# Patient Record
Sex: Female | Born: 1965 | Race: White | Hispanic: No | Marital: Married | Smoking: Former smoker
Health system: Southern US, Community
[De-identification: ages and names within clinical notes are randomized; demographics above are authoritative.]

## PROBLEM LIST (undated history)

## (undated) DIAGNOSIS — F419 Anxiety disorder, unspecified: Secondary | ICD-10-CM

## (undated) DIAGNOSIS — F319 Bipolar disorder, unspecified: Secondary | ICD-10-CM

## (undated) DIAGNOSIS — M1710 Unilateral primary osteoarthritis, unspecified knee: Secondary | ICD-10-CM

## (undated) DIAGNOSIS — F329 Major depressive disorder, single episode, unspecified: Secondary | ICD-10-CM

## (undated) DIAGNOSIS — M1990 Unspecified osteoarthritis, unspecified site: Secondary | ICD-10-CM

## (undated) DIAGNOSIS — E7800 Pure hypercholesterolemia, unspecified: Secondary | ICD-10-CM

## (undated) DIAGNOSIS — I10 Essential (primary) hypertension: Secondary | ICD-10-CM

## (undated) DIAGNOSIS — Z9889 Other specified postprocedural states: Secondary | ICD-10-CM

## (undated) DIAGNOSIS — K2270 Barrett's esophagus without dysplasia: Secondary | ICD-10-CM

## (undated) DIAGNOSIS — F32A Depression, unspecified: Secondary | ICD-10-CM

## (undated) DIAGNOSIS — J449 Chronic obstructive pulmonary disease, unspecified: Secondary | ICD-10-CM

## (undated) DIAGNOSIS — R112 Nausea with vomiting, unspecified: Secondary | ICD-10-CM

## (undated) DIAGNOSIS — K219 Gastro-esophageal reflux disease without esophagitis: Secondary | ICD-10-CM

## (undated) DIAGNOSIS — G35 Multiple sclerosis: Secondary | ICD-10-CM

## (undated) DIAGNOSIS — T148XXA Other injury of unspecified body region, initial encounter: Secondary | ICD-10-CM

## (undated) HISTORY — DX: Other injury of unspecified body region, initial encounter: T14.8XXA

## (undated) HISTORY — DX: Multiple sclerosis: G35

## (undated) HISTORY — DX: Barrett's esophagus without dysplasia: K22.70

## (undated) HISTORY — PX: OTHER SURGICAL HISTORY: SHX169

## (undated) HISTORY — DX: Depression, unspecified: F32.A

## (undated) HISTORY — PX: FOOT SURGERY: SHX648

## (undated) HISTORY — PX: TUBAL LIGATION: SHX77

## (undated) HISTORY — DX: Chronic obstructive pulmonary disease, unspecified: J44.9

## (undated) HISTORY — DX: Pure hypercholesterolemia, unspecified: E78.00

## (undated) HISTORY — DX: Major depressive disorder, single episode, unspecified: F32.9

## (undated) HISTORY — PX: SINUS SURGERY WITH INSTATRAK: SHX5215

## (undated) HISTORY — DX: Gastro-esophageal reflux disease without esophagitis: K21.9

## (undated) HISTORY — DX: Bipolar disorder, unspecified: F31.9

## (undated) HISTORY — PX: ABDOMINAL HYSTERECTOMY: SHX81

## (undated) HISTORY — DX: Essential (primary) hypertension: I10

## (undated) HISTORY — DX: Anxiety disorder, unspecified: F41.9

## (undated) HISTORY — PX: SURGERY OF LIP: SUR1315

---

## 1999-06-15 ENCOUNTER — Other Ambulatory Visit: Admission: RE | Admit: 1999-06-15 | Discharge: 1999-06-15 | Payer: Self-pay | Admitting: Gynecology

## 2002-10-27 ENCOUNTER — Inpatient Hospital Stay (HOSPITAL_COMMUNITY): Admission: EM | Admit: 2002-10-27 | Discharge: 2002-10-29 | Payer: Self-pay | Admitting: Emergency Medicine

## 2002-10-27 ENCOUNTER — Encounter: Payer: Self-pay | Admitting: Emergency Medicine

## 2002-10-29 ENCOUNTER — Encounter: Payer: Self-pay | Admitting: *Deleted

## 2004-02-16 ENCOUNTER — Ambulatory Visit: Payer: Self-pay | Admitting: Family Medicine

## 2004-05-27 ENCOUNTER — Ambulatory Visit: Payer: Self-pay | Admitting: Family Medicine

## 2004-06-30 ENCOUNTER — Ambulatory Visit: Payer: Self-pay | Admitting: Family Medicine

## 2004-07-12 ENCOUNTER — Ambulatory Visit: Payer: Self-pay | Admitting: Family Medicine

## 2004-08-11 ENCOUNTER — Ambulatory Visit: Payer: Self-pay | Admitting: Family Medicine

## 2004-09-22 ENCOUNTER — Ambulatory Visit: Payer: Self-pay | Admitting: Family Medicine

## 2004-10-01 ENCOUNTER — Emergency Department (HOSPITAL_COMMUNITY): Admission: EM | Admit: 2004-10-01 | Discharge: 2004-10-01 | Payer: Self-pay | Admitting: Emergency Medicine

## 2004-10-13 ENCOUNTER — Ambulatory Visit: Payer: Self-pay | Admitting: Family Medicine

## 2004-10-24 ENCOUNTER — Ambulatory Visit: Payer: Self-pay | Admitting: Orthopedic Surgery

## 2004-12-06 ENCOUNTER — Ambulatory Visit: Payer: Self-pay | Admitting: Family Medicine

## 2005-02-09 ENCOUNTER — Ambulatory Visit: Payer: Self-pay | Admitting: Family Medicine

## 2005-03-12 ENCOUNTER — Emergency Department (HOSPITAL_COMMUNITY): Admission: EM | Admit: 2005-03-12 | Discharge: 2005-03-13 | Payer: Self-pay | Admitting: Emergency Medicine

## 2005-03-24 ENCOUNTER — Ambulatory Visit: Payer: Self-pay | Admitting: Family Medicine

## 2005-04-28 ENCOUNTER — Ambulatory Visit: Payer: Self-pay | Admitting: Family Medicine

## 2005-05-19 ENCOUNTER — Emergency Department (HOSPITAL_COMMUNITY): Admission: EM | Admit: 2005-05-19 | Discharge: 2005-05-19 | Payer: Self-pay | Admitting: Emergency Medicine

## 2005-05-22 ENCOUNTER — Ambulatory Visit: Payer: Self-pay | Admitting: Family Medicine

## 2005-05-24 ENCOUNTER — Ambulatory Visit: Payer: Self-pay | Admitting: Family Medicine

## 2005-08-06 ENCOUNTER — Emergency Department (HOSPITAL_COMMUNITY): Admission: EM | Admit: 2005-08-06 | Discharge: 2005-08-06 | Payer: Self-pay | Admitting: *Deleted

## 2005-08-22 ENCOUNTER — Ambulatory Visit: Payer: Self-pay | Admitting: Family Medicine

## 2005-10-06 ENCOUNTER — Ambulatory Visit: Payer: Self-pay | Admitting: Family Medicine

## 2005-10-17 ENCOUNTER — Emergency Department (HOSPITAL_COMMUNITY): Admission: EM | Admit: 2005-10-17 | Discharge: 2005-10-18 | Payer: Self-pay | Admitting: Emergency Medicine

## 2005-10-17 ENCOUNTER — Ambulatory Visit: Payer: Self-pay | Admitting: Family Medicine

## 2005-10-18 ENCOUNTER — Inpatient Hospital Stay (HOSPITAL_COMMUNITY): Admission: AD | Admit: 2005-10-18 | Discharge: 2005-10-20 | Payer: Self-pay | Admitting: Psychiatry

## 2005-10-18 ENCOUNTER — Ambulatory Visit: Payer: Self-pay | Admitting: Psychiatry

## 2005-10-23 ENCOUNTER — Emergency Department (HOSPITAL_COMMUNITY): Admission: EM | Admit: 2005-10-23 | Discharge: 2005-10-23 | Payer: Self-pay | Admitting: Emergency Medicine

## 2005-10-26 ENCOUNTER — Ambulatory Visit: Payer: Self-pay | Admitting: Family Medicine

## 2005-11-13 ENCOUNTER — Emergency Department (HOSPITAL_COMMUNITY): Admission: EM | Admit: 2005-11-13 | Discharge: 2005-11-13 | Payer: Self-pay | Admitting: Emergency Medicine

## 2005-11-27 ENCOUNTER — Ambulatory Visit: Payer: Self-pay | Admitting: Family Medicine

## 2006-01-19 ENCOUNTER — Ambulatory Visit (HOSPITAL_COMMUNITY): Admission: RE | Admit: 2006-01-19 | Discharge: 2006-01-19 | Payer: Self-pay | Admitting: Neurology

## 2006-02-06 ENCOUNTER — Ambulatory Visit: Payer: Self-pay | Admitting: Family Medicine

## 2006-03-21 ENCOUNTER — Emergency Department (HOSPITAL_COMMUNITY): Admission: EM | Admit: 2006-03-21 | Discharge: 2006-03-21 | Payer: Self-pay | Admitting: Emergency Medicine

## 2006-04-06 ENCOUNTER — Ambulatory Visit: Payer: Self-pay | Admitting: Family Medicine

## 2006-04-25 ENCOUNTER — Ambulatory Visit: Payer: Self-pay | Admitting: Family Medicine

## 2006-06-10 ENCOUNTER — Emergency Department (HOSPITAL_COMMUNITY): Admission: EM | Admit: 2006-06-10 | Discharge: 2006-06-10 | Payer: Self-pay | Admitting: Emergency Medicine

## 2006-06-22 ENCOUNTER — Ambulatory Visit: Payer: Self-pay | Admitting: Family Medicine

## 2006-06-29 ENCOUNTER — Emergency Department (HOSPITAL_COMMUNITY): Admission: EM | Admit: 2006-06-29 | Discharge: 2006-06-29 | Payer: Self-pay | Admitting: Emergency Medicine

## 2006-07-05 ENCOUNTER — Ambulatory Visit: Payer: Self-pay | Admitting: Family Medicine

## 2006-07-10 ENCOUNTER — Ambulatory Visit (HOSPITAL_COMMUNITY): Admission: RE | Admit: 2006-07-10 | Discharge: 2006-07-10 | Payer: Self-pay | Admitting: Family Medicine

## 2006-08-02 ENCOUNTER — Ambulatory Visit: Payer: Self-pay | Admitting: Family Medicine

## 2006-09-04 ENCOUNTER — Ambulatory Visit: Payer: Self-pay | Admitting: Family Medicine

## 2006-09-27 ENCOUNTER — Encounter: Admission: RE | Admit: 2006-09-27 | Discharge: 2006-10-25 | Payer: Self-pay | Admitting: Orthopaedic Surgery

## 2006-10-08 ENCOUNTER — Encounter: Admission: RE | Admit: 2006-10-08 | Discharge: 2006-10-08 | Payer: Self-pay | Admitting: Orthopaedic Surgery

## 2006-10-29 ENCOUNTER — Encounter: Admission: RE | Admit: 2006-10-29 | Discharge: 2006-10-29 | Payer: Self-pay | Admitting: Orthopaedic Surgery

## 2006-11-20 ENCOUNTER — Encounter: Admission: RE | Admit: 2006-11-20 | Discharge: 2006-11-20 | Payer: Self-pay | Admitting: Orthopaedic Surgery

## 2007-10-02 ENCOUNTER — Ambulatory Visit (HOSPITAL_COMMUNITY): Admission: RE | Admit: 2007-10-02 | Discharge: 2007-10-02 | Payer: Self-pay | Admitting: Family Medicine

## 2009-01-26 ENCOUNTER — Ambulatory Visit (HOSPITAL_COMMUNITY): Admission: RE | Admit: 2009-01-26 | Discharge: 2009-01-26 | Payer: Self-pay | Admitting: Obstetrics & Gynecology

## 2010-01-31 ENCOUNTER — Ambulatory Visit (HOSPITAL_COMMUNITY): Admission: RE | Admit: 2010-01-31 | Discharge: 2010-01-31 | Payer: Self-pay | Admitting: Obstetrics & Gynecology

## 2010-02-15 ENCOUNTER — Emergency Department (HOSPITAL_COMMUNITY): Admission: EM | Admit: 2010-02-15 | Discharge: 2010-02-15 | Payer: Self-pay | Admitting: Emergency Medicine

## 2010-06-09 HISTORY — PX: ESOPHAGOGASTRODUODENOSCOPY: SHX1529

## 2010-06-13 ENCOUNTER — Encounter: Payer: Self-pay | Admitting: Internal Medicine

## 2010-06-13 ENCOUNTER — Encounter: Payer: Self-pay | Admitting: Gastroenterology

## 2010-06-13 ENCOUNTER — Ambulatory Visit (INDEPENDENT_AMBULATORY_CARE_PROVIDER_SITE_OTHER): Payer: Medicare Other | Admitting: Gastroenterology

## 2010-06-13 DIAGNOSIS — R1013 Epigastric pain: Secondary | ICD-10-CM | POA: Insufficient documentation

## 2010-06-13 DIAGNOSIS — R1319 Other dysphagia: Secondary | ICD-10-CM

## 2010-06-13 DIAGNOSIS — R1310 Dysphagia, unspecified: Secondary | ICD-10-CM | POA: Insufficient documentation

## 2010-06-17 ENCOUNTER — Encounter: Payer: Self-pay | Admitting: Gastroenterology

## 2010-06-20 ENCOUNTER — Other Ambulatory Visit: Payer: Self-pay | Admitting: Internal Medicine

## 2010-06-20 ENCOUNTER — Encounter (HOSPITAL_COMMUNITY): Payer: Medicare Other | Attending: Internal Medicine

## 2010-06-20 DIAGNOSIS — Z01810 Encounter for preprocedural cardiovascular examination: Secondary | ICD-10-CM | POA: Insufficient documentation

## 2010-06-20 DIAGNOSIS — Z01812 Encounter for preprocedural laboratory examination: Secondary | ICD-10-CM | POA: Insufficient documentation

## 2010-06-20 LAB — HEMOGLOBIN AND HEMATOCRIT, BLOOD
HCT: 43.8 % (ref 36.0–46.0)
Hemoglobin: 15.5 g/dL — ABNORMAL HIGH (ref 12.0–15.0)

## 2010-06-20 LAB — BASIC METABOLIC PANEL
Calcium: 9.1 mg/dL (ref 8.4–10.5)
GFR calc Af Amer: >60 mL/min (ref >60)
GFR calc non Af Amer: >60 mL/min (ref >60)
Glucose, Bld: 88 mg/dL (ref 70–99)
Potassium: 3.9 mEq/L (ref 3.5–5.1)
Sodium: 138 mEq/L (ref 135–145)

## 2010-06-21 LAB — CK TOTAL AND CKMB (NOT AT ARMC)
CK, MB: 1.6 ng/mL (ref 0.3–4.0)
Relative Index: INVALID (ref 0.0–2.5)
Total CK: 87 U/L (ref 7–177)

## 2010-06-21 LAB — GLUCOSE, CAPILLARY: Glucose-Capillary: 100 mg/dL — ABNORMAL HIGH (ref 70–99)

## 2010-06-21 LAB — URINALYSIS, ROUTINE W REFLEX MICROSCOPIC
Hgb urine dipstick: NEGATIVE
Ketones, ur: NEGATIVE mg/dL
Protein, ur: NEGATIVE mg/dL
Urobilinogen, UA: 0.2 mg/dL (ref 0.0–1.0)

## 2010-06-21 LAB — CBC
Hemoglobin: 15.6 g/dL — ABNORMAL HIGH (ref 12.0–15.0)
Platelets: 206 10*3/uL (ref 150–400)
RBC: 4.92 MIL/uL (ref 3.87–5.11)
WBC: 11.2 10*3/uL — ABNORMAL HIGH (ref 4.0–10.5)

## 2010-06-21 LAB — DIFFERENTIAL
Basophils Absolute: 0.1 10*3/uL (ref 0.0–0.1)
Basophils Relative: 1 % (ref 0–1)
Eosinophils Relative: 1 % (ref 0–5)
Lymphocytes Relative: 26 % (ref 12–46)
Monocytes Absolute: 0.7 10*3/uL (ref 0.1–1.0)
Monocytes Relative: 6 % (ref 3–12)

## 2010-06-21 LAB — RAPID URINE DRUG SCREEN, HOSP PERFORMED
Amphetamines: NOT DETECTED
Barbiturates: NOT DETECTED
Benzodiazepines: POSITIVE — AB
Tetrahydrocannabinol: NOT DETECTED

## 2010-06-21 LAB — COMPREHENSIVE METABOLIC PANEL
ALT: 14 U/L (ref 0–35)
AST: 19 U/L (ref 0–37)
Calcium: 9.3 mg/dL (ref 8.4–10.5)
Creatinine, Ser: 0.8 mg/dL (ref 0.4–1.2)
GFR calc Af Amer: >60 mL/min (ref >60)
GFR calc non Af Amer: >60 mL/min (ref >60)
Sodium: 136 mEq/L (ref 135–145)
Total Protein: 6.6 g/dL (ref 6.0–8.3)

## 2010-06-21 LAB — POCT CARDIAC MARKERS
CKMB, poc: <1 ng/mL — ABNORMAL LOW (ref 1.0–8.0)
Myoglobin, poc: 30.4 ng/mL (ref 12–200)

## 2010-06-21 LAB — TROPONIN I: Troponin I: 0.01 ng/mL (ref 0.00–0.06)

## 2010-06-21 NOTE — Letter (Signed)
Summary: EGD/ED ORDER  EGD/ED ORDER   Imported By: Ave Filter 06/13/2010 10:28:45  _____________________________________________________________________  External Attachment:    Type:   Image     Comment:   External Document

## 2010-06-21 NOTE — Assessment & Plan Note (Signed)
Summary: GERD,ACID REFLUX,WITH SMOTHERING EPISODES   Vital Signs:  Patient profile:   45 year old female Height:      65 inches Weight:      219 pounds BMI:     36.58 Temp:     98.9 degrees F oral Pulse rate:   92 / minute BP sitting:   108 / 82  (left arm)  Vitals Entered By: Carolan Clines LPN (June 12, 452 9:32 AM)  Visit Type:  Initial Consult Referring Provider:  Dr. Lysbeth Galas Primary Care Provider:  Dr. Lysbeth Galas   History of Present Illness: Katelyn Thompson is a pleasant 45 year old Caucasian female who presents today with c/o esophageal dysphagia X several years, but it has worsened over the past 4-5 months with increased choking, feelings like "closing up". +odynophagia. +epigastric pain, "burning", worse with eating. + nausea. Takes omeprazole twice/day. has been taking for several years. Has taken Nexium in remote past. Unable to afford. Was given Dexilant by Dr. Lysbeth Galas but unable to afford (90$ per month).  +smoker X 33 years, 1.5 ppd. No BC, goodys.  Pt states remote hx of upper EGD, unsure what year, possibly 20+ years ago. Reports possible ulcer.   Current Medications (verified): 1)  Albuterol Sulfate (2.5 Mg/12ml) 0.083% Nebu (Albuterol Sulfate) .... Use One Two Times A Day As Needed For Copd 2)  Hydrocodone-Homatropine 5-1.5 Mg/102ml Syrp (Hydrocodone-Homatropine) .... Take One Tsp Q6 As Needed 3)  Seroquel Xr 50 Mg Xr24h-Tab (Quetiapine Fumarate) .... Take One Once Daily 4)  Gabapentin 800 Mg Tabs (Gabapentin) .... Take One Three Times A Day 5)  Omeprazole 20 Mg Cpdr (Omeprazole) .... Take One Two Times A Day 6)  Meloxicam 7.5 Mg Tabs (Meloxicam) .... Take One Two Times A Day 7)  Lamotrigine 100 Mg Tabs (Lamotrigine) .... Take One Two Times A Day 8)  Ergocalciferol 50000 Unit Caps (Ergocalciferol) .... Take One Q Week For Three Months 9)  Epitol 200 Mg Tabs (Carbamazepine) .... Take One Two Times A Day 10)  Trazodone Hcl 150 Mg Tabs (Trazodone Hcl) .... Take One At Bedtime 11)   Alprazolam 1 Mg Tabs (Alprazolam) .... Take One Three Times A Day 12)  Flonase 50 Mcg/act Susp (Fluticasone Propionate) .... Use Two Squirts in Each Nostrile Once Daily 13)  Daliresp 500 Mcg Tabs (Roflumilast) .... Take One Once Daily 14)  Amitriptyline Hcl 25 Mg Tabs (Amitriptyline Hcl) .... Take One At Bed Time 15)  Amlodipine Besylate 5 Mg Tabs (Amlodipine Besylate) .... Take One Once Daily 16)  Lisinopril 10 Mg Tabs (Lisinopril) .... Take One Once Daily 17)  Colon Clense .... Take One Every Night 18)  Mucinex 600 Mg Xr12h-Tab (Guaifenesin) .... Take Bid 19)  Combivent 18-103 Mcg/act Aero (Ipratropium-Albuterol) .... Take Two Puffs As Needed For Cough  Allergies (verified): 1)  ! Macrobid 2)  ! Aspirin  Past History:  Past Medical History: GERD COPD HTN Severe nerve damage left foot Bipolar disorder ?MS but not officially diagnosed Depression Anxiety: does not like being around lots of people Hypercholesterolemia  Past Surgical History: Left foot X 2 hysterectomy sinus operation tubal ligation  Family History: Mother:living, DM, HTN, heart disease, osteoporosis  Father:deceased, heart failure Siblings: brother deceased at 67 from MI               brother heart disease               brother COPD          No FH of Colon Cancer:  Social History: Disabled Patient currently smokes. X 33 years, 1.5 ppd Alcohol Use - 1-2X per year Illicit Drug Use - no Smoking Status:  current Drug Use:  no  Review of Systems General:  Denies fever, chills, and anorexia. Eyes:  Denies blurring, irritation, and discharge. ENT:  Denies sore throat and hoarseness. CV:  Denies chest pains and syncope. Resp:  Denies dyspnea at rest and wheezing. GI:  See HPI. GU:  Denies urinary burning and urinary frequency. MS:  Denies joint pain / LOM, joint swelling, and joint stiffness. Derm:  Denies rash, itching, and dry skin. Neuro:  Denies weakness and syncope. Psych:  Denies depression  and anxiety. Endo:  Denies cold intolerance and heat intolerance.  Physical Exam  General:  Well developed, well nourished, no acute distress.obese.   Head:  Normocephalic and atraumatic. Eyes:  PERRLA, no icterus. Lungs:  bilateral inspiratory/expiratory wheezes. Diminished in bases.  Heart:  Regular rate and rhythm; no murmurs, rubs,  or bruits. Abdomen:  +BS, soft, mildly tender to palpation epigastric area. no rebound or guarding. no HSM.  Msk:  Symmetrical with no gross deformities. Normal posture. Pulses:  Normal pulses noted. Extremities:  No clubbing, cyanosis, edema or deformities noted. Neurologic:  Alert and  oriented x4;  grossly normal neurologically. Skin:  Intact without significant lesions or rashes. Psych:  Alert and cooperative. Normal mood and affect.   Impression & Recommendations:  Problem # 1:  ABDOMINAL PAIN-EPIGASTRIC (ICD-35.76)  45 year old Caucasian female with reports of esophageal dysphagia X several years but significantly worsened over past 4-5 months. Increased choking, feels like "closing up". +odynophagia, +epigastric burning pain, worsened with eating. Associated nausea. Symptoms continued despite PPI therapy twice/day (Prilosec). Denies BC or Goodys. Remote hx of EGD in past >20 years ago, questionable PUD. Worsening symptoms despite PPI therapy warrant further investigation.    EGD/ED with Dr. Jena Gauss in OR secondary to polypharmacy: the R/B/A have been discussed in detail with pt; she states understanding and desires to proceed. Continue Prilosec twice/day.   Orders: Consultation Level III (40981)  Problem # 2:  DYSPHAGIA (XBJ-478.29)  See # 1.   Orders: Consultation Level III (56213)   Orders Added: 1)  Consultation Level III [08657]

## 2010-06-21 NOTE — Letter (Signed)
[facility]: [facility]   Imported By: Rexene Alberts 06/17/2010 11:11:07  _____________________________________________________________________  External Attachment:    Type:   Image     Comment:   External Document

## 2010-06-23 ENCOUNTER — Encounter: Payer: Medicare Other | Admitting: Internal Medicine

## 2010-06-23 ENCOUNTER — Other Ambulatory Visit: Payer: Self-pay | Admitting: Internal Medicine

## 2010-06-23 ENCOUNTER — Ambulatory Visit (HOSPITAL_COMMUNITY)
Admission: RE | Admit: 2010-06-23 | Discharge: 2010-06-23 | Disposition: A | Discharge status: Home / Self Care | Payer: Medicare Other | Source: Ambulatory Visit | Attending: Internal Medicine | Admitting: Internal Medicine

## 2010-06-23 DIAGNOSIS — J4489 Other specified chronic obstructive pulmonary disease: Secondary | ICD-10-CM | POA: Insufficient documentation

## 2010-06-23 DIAGNOSIS — I10 Essential (primary) hypertension: Secondary | ICD-10-CM | POA: Insufficient documentation

## 2010-06-23 DIAGNOSIS — K449 Diaphragmatic hernia without obstruction or gangrene: Secondary | ICD-10-CM | POA: Insufficient documentation

## 2010-06-23 DIAGNOSIS — J449 Chronic obstructive pulmonary disease, unspecified: Secondary | ICD-10-CM | POA: Insufficient documentation

## 2010-06-23 DIAGNOSIS — R1310 Dysphagia, unspecified: Secondary | ICD-10-CM

## 2010-06-23 DIAGNOSIS — Z79899 Other long term (current) drug therapy: Secondary | ICD-10-CM | POA: Insufficient documentation

## 2010-06-23 DIAGNOSIS — R1013 Epigastric pain: Secondary | ICD-10-CM | POA: Insufficient documentation

## 2010-06-23 DIAGNOSIS — K2270 Barrett's esophagus without dysplasia: Secondary | ICD-10-CM | POA: Insufficient documentation

## 2010-06-26 NOTE — Op Note (Signed)
  [name], Katelyn Thompson                ACCOUNT NO.:  [id number]  MEDICAL RECORD NO.:  [id number]           PATIENT TYPE:  O  LOCATION:  DAYP                          FACILITY:  [facility]  PHYSICIAN:  R. Roetta Sessions, M.D. DATE OF BIRTH:  [birth year]  DATE OF PROCEDURE:  06/23/2010 DATE OF DISCHARGE:                              OPERATIVE REPORT   PROCEDURE:  EGD with Elease Hashimoto dilation followed by esophageal biopsy.  INDICATIONS FOR PROCEDURE:  A 45 year old lady with esophageal dysphagia for years, some odynophagia, recently epigastric burning, takes meloxicam daily, was not able to afford Dexilant in lieu of Prilosec. EGD is now being done.  Risks, benefits, limitations, alternatives, and imponderables have been discussed, questions answered.  Because of her polypharmacy, the procedure is being done in the OR under propofol. Please see the documentation for the medical record.  PROCEDURE NOTE:  The patient was placed in left lateral decubitusposition.  Propofol sedation per Dr. Jayme Cloud and associates.  Cetacaine spray for topical pharyngeal anesthesia.  INSTRUMENT:  Pentax video chip system.  FINDINGS:  Examination of tubular esophagus revealed a patent tubular esophagus.  There was a serrated accentuated, undulating Z-line.  There is no esophagitis, tumor, or other abnormality observed.  EG junction was easily traversed. Stomach:  The stomach was insufflated well with air.  Thorough examination of the gastric mucosa including retroflexed proximal stomach, esophagogastric junction demonstrated only a small hiatal hernia, otherwise gastric mucosa appeared entirely normal.  Pylorus was patent, easily traversed.  Examination of the bulb and second portion revealed no abnormalities.  THERAPEUTIC/DIAGNOSTIC MANEUVERS PERFORMED:  Scope was removed.  A 54- French Maloney dilator was passed to full insertion with ease.  Look back revealed no apparent complication.  All related passage of  dilated esophagus was not injured with the passage of the [facility] dilator. Subsequent biopsies of the salmon-colored epithelium just only what appeared to be the salmon-colored side.  Distal esophagus was biopsied for histologic study.  Subsequent biopsies of more proximal and mid esophagus were taken to rule out possibility of eosinophilic esophagitis.  The patient tolerated the procedure well and was taken to the PACU in stable condition.  IMPRESSION: 1. Serrated, accentuated, undulating Z-line status post passage of     Maloney dilator and subsequent biopsies. 2. Small hiatal hernia, otherwise normal stomach, D1 and D2.  RECOMMENDATIONS: 1. Stop Prilosec, begin Protonix 40 mg orally daily. 2. Add Carafate 1 g suspension q.i.d. x2 weeks. 3. Follow up on path. 4. Further recommendations to follow.     Jonathon Bellows, M.D.     RMR/MEDQ  D:  06/23/2010  T:  06/23/2010  Job:  [id number]  cc:   Delaney Meigs, M.D. Fax: [phone]  Electronically Signed by Lorrin Goodell M.D. on 06/26/2010 09:13:06 AM

## 2010-06-29 NOTE — Progress Notes (Signed)
Pt is aware of OV for 09/26/10 @ 0900 w/LSL and her next EGD is on recall to repeat in one year

## 2010-06-29 NOTE — Progress Notes (Signed)
Append to Path of EGD by Dr. Jena Gauss....  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 Needs OV here in 3-4 months  And repeat EGD in one year.  Pt informed of the above. Literature on Barretts mailed.

## 2010-08-26 NOTE — Consult Note (Signed)
NAME:  Katelyn Thompson, Katelyn Thompson NO.:  [id number]   MEDICAL RECORD NO.:  [id number]                   PATIENT TYPE:  INP   LOCATION:  IC03                                 FACILITY:  [facility]   PHYSICIAN:  Vida Roller, M.D.                DATE OF BIRTH:  1965/12/15   DATE OF CONSULTATION:  10/28/2002  DATE OF DISCHARGE:                                   CONSULTATION   PRIMARY CARE Elmin Wiederh[facility]:  Delaney Meigs, M.D.   CARDIOLOGIST:  None.   REASON FOR CONSULTATION:  This is a 45 year old female with no known history  of coronary artery disease who presents with atypical chest discomfort while  at work, working with a Runner, broadcasting/film/video.  She states it is relatively heavy  work and that she has had episodes of discomfort in her chest prior to this  but this is not a consistent problem for her.  Yesterday's episode was quite  significant and motivated her to come to the emergency department where she  received two sublingual nitroglycerin with resolution of the pain.  Total  duration of the pain was approximately one hour 45 minutes.   PAST MEDICAL HISTORY:  Significant for gastroesophageal reflux disease which  she describes as relatively severe.  She says she has a hiatal hernia.  She  takes Prilosec once a day but it really does not control the symptoms.  She  frequently has a brackish taste in the back of her mouth and frequently has  a burning sensation in the center of her chest.  There are times when the  food will actually stick in her throat and she has a difficult time  swallowing it.  She has not have overt discomfort or pain associated with  swallowing and has no overt pain in her chest associated with food.  She  also has a history of foot surgery on her right foot x2.  She has had a  hysterectomy.  She has reactive airways disease which is mild to moderate,  secondary to her tobacco abuse.   MEDICATIONS:  Medications prior to admission were:  1.  Bextra 20 mg a day.  2. Darvocet as needed for the pain in her foot.  3. Singulair 10 mg a day  4. Prilosec 20 mg a day.  5. Albuterol p.r.n.   In the hospital she is on:  1. Albuterol four times a day.  2. Aspirin 325 mg a day.  3. Lovenox 80 mg q.12h.  4. Lopressor 12.5 mg twice a day.  5. Singulair 10 mg a day.  6. Nicotine patch.  7. Protonix 40 mg a day.  8. Vioxx 25 mg a day.  9. Nitroglycerin, Xanax, and Darvocet as she needs it for the pain.   SOCIAL HISTORY:  She lives in [place].  She is married.  She has two  children - one is her natural  child and one is a step-child.  She works as a  Administrator, sports.  She smokes about a pack of cigarettes a day and has for the last 25  years.  She does not drink any alcohol, does not use any drugs.  She does  not regularly exercise.   FAMILY HISTORY:  Her mother has coronary artery disease, diabetes, and COPD.  Her father died of congestive heart failure at age 87.  She has one brother  who has coronary artery disease as well.   REVIEW OF SYSTEMS:  Essentially noncontributory.   PHYSICAL EXAMINATION:  GENERAL:  She is a well-developed, well-nourished,  slightly obese white female in no apparent distress who is alert and  oriented x4.  VITAL SIGNS:  She is afebrile, her pulse is 56 in sinus, respiratory rate  18, her blood pressure is 112/67.  HEENT:  Unremarkable.  NECK:  Supple.  There is no jugular venous distention or carotid bruits.  CHEST:  Clear to auscultation.  HEART:  Reveals a nondisplaced point of maximal impulse with no lifts or  thrills.  Her second heart sounds are normal.  There is no third or fourth  heart sound, no murmurs are noted.  ABDOMEN:  Soft, nontender, normal active bowel sounds.  No bruits are noted.  EXTREMITIES:  Her lower extremities were without significant clubbing,  cyanosis, or edema, and pulses are 2+ throughout with no bruits.  NEUROLOGIC:  Nonfocal.   LABORATORY DATA:  Her chest x-ray is normal.   Electrocardiogram reveals  sinus rhythm at a rate of 67 with a normal axis, normal intervals, no ST-T  wave changes concerning for ischemia, and no Q waves concerning for a  myocardial infarction.  Her white blood cell count is 11.3 with a normal  differential.  Her H&H is 16 and 46 with a platelet count of 246,000.  Sodium 139, potassium 4.1, chloride 110, bicarb 26, BUN and creatinine 7 and  0.7, with a blood sugar of 85 nonfasting.  Liver function studies are  normal.  Three sets of cardiac enzymes are inconsistent with acute  myocardial infarction.  Her D-dimer is 0.22.  Blood gas on room air:  pH  7.45, PCO2 62, PCO2 31, 95% saturation.  Urinalysis was normal.   So essentially we have a lady with chest discomfort which is atypical and  has resolved.  Her enzymes and EKGs are inconsistent with an acute coronary  syndrome but she definitely has cardiac risk factors of a family history and  tobacco abuse; second is her ongoing tobacco abuse which I think probably  needs to be aggressively treated; and finally she has relatively impressive  symptoms for gastroesophageal reflux disease and I would recommend  aggressive treatment in that.  So today we are going to get an  echocardiogram and finish the cardiac enzymes.  We will increase her  Protonix to b.i.d. and stop her Vioxx in hopes of improving her reflux  symptoms, which I suspect may be the etiology of her chest discomfort as  well, and tomorrow morning we will get an exercise Cardiolite once we have  assessed the left ventricular function with an echocardiogram.                                               Vida Roller, M.D.    JH/MEDQ  D:  10/28/2002  T:  10/28/2002  Job:  [id number]
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
## 2010-08-26 NOTE — Procedures (Signed)
   NAME:  Katelyn Thompson, Katelyn Thompson                          ACCOUNT NO.:  [id number]   MEDICAL RECORD NO.:  [id number]                   PATIENT TYPE:  INP   LOCATION:  A212                                 FACILITY:  [facility]   PHYSICIAN:  Jesse Sans. Wall, M.D.                DATE OF BIRTH:  [birth year]   DATE OF PROCEDURE:  10/29/2002  DATE OF DISCHARGE:                                    STRESS TEST   EXERCISE CARDIOLITE   INDICATION:  Katelyn Thompson is a 45 year old female with no known coronary artery  disease who presents with atypical chest discomfort.  She was admitted and  ruled out for acute myocardial infarction with three sets of negative  cardiac enzymes.  Her cardiac risk factors include family history and  tobacco abuse.   BASELINE DATA:  EKG shows sinus rhythm at 58 beats per minute with  nonspecific ST abnormalities.  Blood pressure was 106/72.   The patient exercised for a total of six minutes and 40 seconds to Bruce  protocol stage 2.  This stage was held secondary to fatigue; 7.0 METs were  achieved.  Maximum heart rate was 146 beats per minute which is 80% of  maximum predicted.  Maximum blood pressure is 172/90.  This recovered down  to 102/58 in recovery.   EKG showed no ischemic changes and no arrhythmias.   Final images and results are pending M.D. review.     Amy Mercy Riding, P.A. [facility]                     Thomas C. Wall, M.D.    AB/MEDQ  D:  10/29/2002  T:  10/29/2002  Job:  [id number]

## 2010-08-26 NOTE — H&P (Signed)
NAME:  CASHAE, WEICH NO.:  [id number]   MEDICAL RECORD NO.:  [id number]                   PATIENT TYPE:  EMS   LOCATION:  ED                                   FACILITY:  [facility]   PHYSICIAN:  Gracelyn Nurse, M.D.              DATE OF BIRTH:  July 30, 1965   DATE OF ADMISSION:  10/27/2002  DATE OF DISCHARGE:                                HISTORY & PHYSICAL   CHIEF COMPLAINT:  Chest pain.   HISTORY OF PRESENT ILLNESS:  This is a 45 year old white female who presents  with chest pain.  She went to work this morning. She felt nauseated shortly  after and then lightheaded.  She then developed some chest pain that  radiated down her left arm. She became short of breath and diaphoretic.  She  is currently pain free.  She said that she had chest pain a couple of years  ago, but was checked out by her primary care physician, but no stress  testing was done at that time.  She is a heavy smoker.   PAST MEDICAL HISTORY:  1. Bronchitis.  2. Tobacco abuse.  3. GERD.  4. Status post foot surgery to the right foot x2.  5. Status post hysterectomy.   ALLERGIES:  No known drug allergies.   CURRENT MEDICATIONS:  1. Bextra 20 mg daily.  2. Darvocet-N 100 q.4h. p.r.n.  3. Singulair 10 mg daily.  4. Albuterol metered dose inhaler 2 puffs p.r.n.  5. Prilosec 20 mg daily.   SOCIAL HISTORY:  She smokes 1-2 pack of cigarettes a day.  She does not  drink any alcohol.  She does not do any drugs.  She is married with 1 child.   FAMILY HISTORY:  Her mother is age 46 and has coronary artery disease,  diabetes, and COPD.  Her father died at age 79 of congestive heart failure.   REVIEW OF SYSTEMS:  As per HPI.  She does have shortness of breath sometimes  and a chronic cough.  The remainder of the systems negative.   PHYSICAL EXAMINATION:  VITAL SIGNS:  Temperature 98.7, pulse 85,  respirations 20, blood pressure 108/70.  GENERAL:  This is a well-nourished white female  in no acute distress.  HEENT:  Pupils are equal, round, and reacted to light.  Extraocular  movements are intact.  Oral mucosa is moist.  Oropharynx is clear.  CARDIOVASCULAR:  Regular rate and rhythm no murmurs.  LUNGS:  Clear to auscultation.  ABDOMEN:  Soft, nontender, nondistended.  Bowel sounds positive.  EXTREMITIES:  No edema.  NEUROLOGIC:  Cranial nerves II-XII are grossly intact.  No focal deficits.  SKIN: Moist with no rash.   EKG shows normal sinus rhythm.  No ST or T wave abnormalities.   ADMITTING LABS:  White blood cells 11.3, hemoglobin 15.8, platelets 246.  Sodium 139, potassium 4.1, chloride 110, CO2 26, BUN  7, creatinine 0.7,  glucose 85. CK 71, troponin I 0.01.   ASSESSMENT AND PLAN:  1. Chest pain.  The patient has many risk factors including positive family     history and smoking.  She does have a normal EKG and first set of cardiac     enzymes are normal; however, this is no guarantee.  Her history is very     suspicious of some type of ischemic event.  I will go ahead and start her     on aspirin, beta blocker and anticoagulation.  Give her nitro p.r.n.  We     will check cardiac enzymes.  If she rules out we will get cardiology     involved for stress testing.  She did have a normal D-dimmer so I do not     suspect pulmonary embolism at this time.  2. I talked her into wearing a nicotine patch and also will give her some     Xanax p.r.n.  She is a heavy smoker and feels the urge to go out and     smoke right now; however, I did discuss this with her and if she decides     to leave the floor to smoke she understands that she will be doing it at     her own risk and could be risking her life.  3. Bronchitis.  I suspect with her smoking history this is probably     developing chronic obstructive pulmonary disease.  Will continue her     metered dose inhalers and give her some oxygen support.  4. Gastroesophageal reflux disease. I will keep her on a proton pump      inhibitor.                                               Gracelyn Nurse, M.D.    JDJ/MEDQ  D:  10/27/2002  T:  10/27/2002  Job:  [id number]

## 2010-08-26 NOTE — Procedures (Signed)
   NAME:  Katelyn Thompson, Katelyn Thompson                          ACCOUNT NO.:  [id number]   MEDICAL RECORD NO.:  [id number]                   PATIENT TYPE:  INP   LOCATION:  IC03                                 FACILITY:  [facility]   PHYSICIAN:  Vida Roller, M.D.                DATE OF BIRTH:  [birth year]   DATE OF PROCEDURE:  10/28/2002  DATE OF DISCHARGE:                                  ECHOCARDIOGRAM   TAPE NUMBER:  [id number]   TAPE COUNT:  [phone]   CLINICAL INFORMATION:  This is a 45 year old woman with chest pain.   TECHNICAL QUALITY:  Adequate.   M-MODE MEASUREMENTS:  1. The aorta is 23 mm.  2. The left atrium is 36 mm.  3. The septum is 10 mm.  4. The posterior wall is 9 mm.  5. The left ventricular diastolic dimension is 47 mm.  6. The left ventricular systolic dimension is 34 mm.   2-D AND DOPPLER IMAGING:  1. The left ventricle is normal size with normal systolic function.  There     were no wall motion abnormalities seen.  Diastolic function is normal.  2. The right ventricle is normal size with normal systolic function.  3. Both atria are normal size.  There is no obvious atrioseptal defect.  4. The aortic valve is morphologically unremarkable ith no stenosis or     regurgitation.  5. The mitral valve is morphologically unremarkable with trace     insufficiency.  No stenosis is seen.  6. The tricuspid valve is morphologically unremarkable with trace     insufficiency.  No stenosis is seen.  7. The pulmonic valve is morphologically unremarkable with trace     insufficiency.  No stenosis is seen.  8. The pericardial structures are normal.  9. The ascending aorta is normal.  10.      There is normal size vena cava.                                               Vida Roller, M.D.    JH/MEDQ  D:  10/28/2002  T:  10/29/2002  Job:  [id number]

## 2010-08-26 NOTE — Discharge Summary (Signed)
NAME:  Katelyn, Thompson                       ACCOUNT NO.:  [id number]   MEDICAL RECORD NO.:  [id number]                   PATIENT TYPE:  INP   LOCATION:  A212                                 FACILITY:  [facility]   PHYSICIAN:  Gracelyn Nurse, M.D.              DATE OF BIRTH:  [birth year]   DATE OF ADMISSION:  10/27/2002  DATE OF DISCHARGE:  10/29/2002                                 DISCHARGE SUMMARY   DISCHARGE DIAGNOSES:  1. Noncardiac chest pain.  2. Chronic bronchitis.  3. Gastroesophageal reflux disease.  4. Tobacco abuse.  5. Status post foot surgery on the right foot x 2.  6. Status post hysterectomy.   DISCHARGE MEDICATIONS:  1. Bextra 20 mg every day.  2. Darvocet-N 100 q.4h. p.r.n.  3. Singulair 10 mg every day.  4. Prilosec 20 mg every day.  5. Albuterol metered-dose inhaler two puffs p.r.n.   PROCEDURE:  Stress Cardiolite which showed no evidence of ischemia.   REASON FOR ADMISSION:  This is a 45 year old white female who presents with  chest pain that started at work this morning.  She first started feeling  nauseated and lightheaded then she developed pain that radiated down her  left arm.  She became short of breath and diaphoretic.  She is currently  pain free.   HOSPITAL COURSE:  1. Noncardiac chest pain.  She was placed on aspirin, beta-blocker and was     ruled out.  Cardiac enzymes were negative.  Cardiology was consulted and     a stress test was performed with the above results.  She does have severe     reflux and it was felt that this could be the cause of the chest pain as     the tests showed up negative for cardiac causes.   1. Gastroesophageal reflux disease.  She is on Prilosec and she is     encouraged to continue this, also encouraged to take it twice a day if     necessary when she has flare-ups.   1. Chronic bronchitis.  I suspect she is developing chronic obstructive     pulmonary disease with her heavy smoking.  I did counsel her on  smoking     cessation.    DISPOSITION:  The patient is discharged in stable condition.  She is  encouraged to follow up with her primary care physician at her next  scheduled appointment or before as needed.                                               Gracelyn Nurse, M.D.    JDJ/MEDQ  D:  10/29/2002  T:  10/29/2002  Job:  [id number]   cc:   Delaney Meigs, M.D.  [address]  [employer]  [place] [address]  Fax: [phone]   Vida Roller, M.D.  Fax: [phone]

## 2010-09-26 ENCOUNTER — Encounter: Payer: Self-pay | Admitting: Gastroenterology

## 2010-09-26 ENCOUNTER — Ambulatory Visit (INDEPENDENT_AMBULATORY_CARE_PROVIDER_SITE_OTHER): Payer: Medicare Other | Admitting: Gastroenterology

## 2010-09-26 DIAGNOSIS — K2270 Barrett's esophagus without dysplasia: Secondary | ICD-10-CM

## 2010-09-26 DIAGNOSIS — R1319 Other dysphagia: Secondary | ICD-10-CM

## 2010-09-26 DIAGNOSIS — K625 Hemorrhage of anus and rectum: Secondary | ICD-10-CM

## 2010-09-26 DIAGNOSIS — K5909 Other constipation: Secondary | ICD-10-CM | POA: Insufficient documentation

## 2010-09-26 DIAGNOSIS — K5900 Constipation, unspecified: Secondary | ICD-10-CM

## 2010-09-26 MED ORDER — LUBIPROSTONE 24 MCG PO CAPS: ORAL_CAPSULE | ORAL | Status: DC

## 2010-09-26 NOTE — Progress Notes (Signed)
Primary Care Physician: Josue Hector, MD  Primary Gastroenterologist:  Roetta Sessions, MD  Chief Complaint  Patient presents with  . Follow-up    has heartburn and trouble swallowing    HPI: Katelyn Thompson is a 45 y.o. female here for three-month followup. She had EEG in March with new diagnosis of short segment Barrett's esophagus without dysplasia. Her esophagus was dilated. She also had a small hiatal hernia. Still feels like having trouble swallowing at times. Initially swallowing was better after dilation. Breakthrough heartburn couple of times per week. No abdominal pain. Some nausea. Takes colon cleanser every night with minimal results. Fleets supp at times. Miralax doesn't help. Never tried Amitiza. No melena. Has hemorrhoids, some brbpr.      Current Outpatient Prescriptions  Medication Sig Dispense Refill  . albuterol (PROVENTIL) (2.5 MG/3ML) 0.083% nebulizer solution Take 2.5 mg by nebulization every 6 (six) hours as needed.        Marland Kitchen albuterol-ipratropium (COMBIVENT) 18-103 MCG/ACT inhaler Inhale 2 puffs into the lungs every 6 (six) hours as needed.        . ALPRAZolam (XANAX) 1 MG tablet Take 1 mg by mouth at bedtime as needed.        Marland Kitchen amitriptyline (ELAVIL) 25 MG tablet Take 25 mg by mouth at bedtime.        Marland Kitchen amLODipine (NORVASC) 5 MG tablet Take 5 mg by mouth daily.        . carbamazepine (TEGRETOL) 200 MG tablet Take 200 mg by mouth 3 (three) times daily.        . ergocalciferol (VITAMIN D2) 50000 UNITS capsule Take 50,000 Units by mouth once a week.        . fluticasone (FLONASE) 50 MCG/ACT nasal spray Place 2 sprays into the nose daily.        Marland Kitchen gabapentin (NEURONTIN) 800 MG tablet Take 800 mg by mouth 3 (three) times daily.        Marland Kitchen HYDROcodone-homatropine (HYCODAN) 5-1.5 MG/5ML syrup Take by mouth every 6 (six) hours as needed.        . lamoTRIgine (LAMICTAL) 100 MG tablet Take 100 mg by mouth daily.        . roflumilast (DALIRESP) 500 MCG TABS tablet Take 500  mcg by mouth daily.        Marland Kitchen lisinopril (PRINIVIL,ZESTRIL) 10 MG tablet 10 mg daily.       Marland Kitchen lubiprostone (AMITIZA) 24 MCG capsule Take one pill with food once to twice daily for diarrhea.  60 capsule  3  . meloxicam (MOBIC) 7.5 MG tablet 7.5 mg 2 (two) times daily.       . pantoprazole (PROTONIX) 40 MG tablet 40 mg daily.       . SEROQUEL XR 50 MG TB24 50 mg at bedtime.       . traZODone (DESYREL) 150 MG tablet 150 mg daily.         Allergies as of 09/26/2010 - Review Complete 09/26/2010  Allergen Reaction Noted  . Aspirin  06/13/2010  . Nitrofurantoin  06/13/2010    ROS:  General: Negative for anorexia, weight loss, fever, chills, fatigue, weakness. ENT: Negative for hoarseness, difficulty swallowing , nasal congestion. CV: Negative for chest pain, angina, palpitations, dyspnea on exertion, peripheral edema.  Respiratory: Negative for dyspnea at rest, dyspnea on exertion, cough, sputum, wheezing.  GI: See history of present illness. GU:  Negative for dysuria, hematuria, urinary incontinence, urinary frequency, nocturnal urination.  Endo: Negative for unusual weight change.  Physical Examination:   BP 149/97  Pulse 98  Temp(Src) 97.4 F (36.3 C) (Temporal)  Ht 5\' 5"  (1.651 m)  Wt 201 lb 12.8 oz (91.536 kg)  BMI 33.58 kg/m2  General: Well-nourished, well-developed in no acute distress.  Eyes: No icterus. Mouth: Oropharyngeal mucosa moist and pink , no lesions erythema or exudate. Lungs: Clear to auscultation bilaterally.  Heart: Regular rate and rhythm, no murmurs rubs or gallops.  Abdomen: Bowel sounds are normal, nontender, nondistended, no hepatosplenomegaly or masses, no abdominal bruits or hernia , no rebound or guarding.   Extremities: No lower extremity edema.  Neuro: Alert and oriented x 4   Skin: Warm and dry, no jaundice.   Psych: Alert and cooperative, normal mood and affect.

## 2010-09-27 ENCOUNTER — Encounter: Payer: Self-pay | Admitting: Gastroenterology

## 2010-09-27 NOTE — Assessment & Plan Note (Signed)
Discussed Barrett's esophagus with patient. She'll need to maintain PPI indefinitely. Next EGD March 2013.

## 2010-09-27 NOTE — Assessment & Plan Note (Signed)
Persistent esophageal dysphagia status post dilation in March. Evaluate via a barium pill esophagram.

## 2010-09-27 NOTE — Assessment & Plan Note (Addendum)
Begin Amitiza 24 mcg once to twice daily. #14 samples provided. Rx provided as well. She also has intermittent hematochezia likely due to constipation and benign anorectal source however will retrieve old colonoscopy report for review.  Offices in 3 months.

## 2010-09-27 NOTE — Progress Notes (Signed)
Cc to PCP 

## 2010-09-30 ENCOUNTER — Institutional Professional Consult (permissible substitution): Payer: Medicare Other | Admitting: Pulmonary Disease

## 2010-10-03 ENCOUNTER — Ambulatory Visit (HOSPITAL_COMMUNITY)
Admission: RE | Admit: 2010-10-03 | Discharge: 2010-10-03 | Disposition: A | Discharge status: Home / Self Care | Payer: Medicare Other | Source: Ambulatory Visit | Attending: Gastroenterology | Admitting: Gastroenterology

## 2010-10-03 DIAGNOSIS — R1310 Dysphagia, unspecified: Secondary | ICD-10-CM | POA: Insufficient documentation

## 2010-10-03 DIAGNOSIS — R1319 Other dysphagia: Secondary | ICD-10-CM

## 2010-10-03 DIAGNOSIS — R0602 Shortness of breath: Secondary | ICD-10-CM | POA: Insufficient documentation

## 2010-10-03 DIAGNOSIS — K219 Gastro-esophageal reflux disease without esophagitis: Secondary | ICD-10-CM | POA: Insufficient documentation

## 2010-10-13 NOTE — Progress Notes (Signed)
Please obtain old TCS report on patient. ?[facility] vs obtain from storage.

## 2010-10-14 NOTE — Progress Notes (Signed)
Katelyn Thompson went all the way back to 2004 and I can not find an old TCS report. This patient was new to [facility] on 03/12

## 2010-10-17 ENCOUNTER — Telehealth: Payer: Self-pay

## 2010-10-17 NOTE — Telephone Encounter (Signed)
Pt called and said that the Amitiza worked great. She just cannot afford it. Please advise. She is aware that Verlon Au is not in at the time and it will be tomorrow before she hears back from [place]. She uses [facility] in [place].

## 2010-10-18 NOTE — Telephone Encounter (Signed)
She failed Miralax. She if she qualifies for any patient assistance with Amitiza (I believe patient is uninsured but you would need to verify that). Really no other great prescription strength options. If none of above help, we can try lactulose 30 cc bid prn. #30day 0 refills.

## 2010-10-18 NOTE — Telephone Encounter (Signed)
LMOM for pt to call. 

## 2010-10-20 NOTE — Telephone Encounter (Signed)
Mailed pt assistance forms to pt

## 2010-10-20 NOTE — Telephone Encounter (Signed)
LMOM for pt that [name] mailed PT assistance form for her to complete for the Amitiza.

## 2010-11-09 ENCOUNTER — Encounter: Payer: Self-pay | Admitting: Pulmonary Disease

## 2010-11-14 ENCOUNTER — Institutional Professional Consult (permissible substitution): Payer: Medicare Other | Admitting: Pulmonary Disease

## 2010-11-30 ENCOUNTER — Other Ambulatory Visit (HOSPITAL_COMMUNITY): Payer: Self-pay | Admitting: Family Medicine

## 2010-11-30 DIAGNOSIS — Z139 Encounter for screening, unspecified: Secondary | ICD-10-CM

## 2010-12-06 ENCOUNTER — Institutional Professional Consult (permissible substitution): Payer: Medicare Other | Admitting: Pulmonary Disease

## 2010-12-27 ENCOUNTER — Ambulatory Visit: Payer: Medicare Other | Admitting: Gastroenterology

## 2011-01-05 ENCOUNTER — Ambulatory Visit (INDEPENDENT_AMBULATORY_CARE_PROVIDER_SITE_OTHER): Payer: Medicare Other | Admitting: Orthopedic Surgery

## 2011-01-05 ENCOUNTER — Encounter: Payer: Self-pay | Admitting: Orthopedic Surgery

## 2011-01-05 VITALS — BP 132/88 | Ht 65.5 in | Wt 200.0 lb

## 2011-01-05 DIAGNOSIS — M67919 Unspecified disorder of synovium and tendon, unspecified shoulder: Secondary | ICD-10-CM

## 2011-01-05 DIAGNOSIS — M75102 Unspecified rotator cuff tear or rupture of left shoulder, not specified as traumatic: Secondary | ICD-10-CM

## 2011-01-05 DIAGNOSIS — M5020 Other cervical disc displacement, unspecified cervical region: Secondary | ICD-10-CM

## 2011-01-05 MED ORDER — PREDNISONE 10 MG PO KIT: 10.0000 mg | PACK | ORAL | Status: DC

## 2011-01-05 MED ORDER — HYDROCODONE-ACETAMINOPHEN 5-500 MG PO TABS: 1.0000 | ORAL_TABLET | Freq: Every day | ORAL | Status: DC

## 2011-01-05 MED ORDER — CYCLOBENZAPRINE HCL 10 MG PO TABS: 10.0000 mg | ORAL_TABLET | Freq: Three times a day (TID) | ORAL | Status: DC | PRN

## 2011-01-05 NOTE — Progress Notes (Signed)
Consult Dr Lysbeth Galas This 45 year old female Presents with primarily neck pain radiating down her LEFT shoulder associated with numbness and tingling continuing down the arm into the hand.  She complains of sharp dull throbbing stabbing burning pain of gradual onset which is 8/10 and constant.  She has some pain with 4 elevation as well and with use of her LEFT upper extremity.  Her pain has been somewhat controlled with Flexeril 10 mg and Vicodin 5 mg She has not had any physical therapy at this time.  She has quite an extensive review of systems her weight goes up and down she reports chills.  She reports blurred vision shortness of breath wheezing and cough and snoring, she has COPD.  She complains of heartburn and constipation.  She complains of numbness and tingling as well as dizziness.  She complains of nervousness anxiety and depression.  She complains of skin easily bruised.  She complains of excessive thirst, urination and temperature intolerance.  Physical Exam(12) GENERAL: normal development, grooming, and hygiene   CDV: pulses are normal, there is no edema, extremities are warm to touch   Skin: normal, and all extremities  Lymph: nodes were not palpable/normal, cervical and supraclavicular  Psychiatric: awake, alert and oriented  Neuro: normal sensation  MSK Ambulation is normal  Neck exam: She is tender at the base of cervical spine and also has increased muscle tension and tightness on the LEFT.  She has decreased range of motion to the LEFT and with extension which reproduces her radicular symptoms  1 RIGHT shoulder painful for elevation at 150.  Her impingement sign is mild.  Her shoulder is stable.  Her cuff is intact and grade 5 strength.  Tenderness over the trapezius muscle and medial scapula. 2 Reflexes are 2+ and equal at the elbow and wrist  Imaging:X-rays of the shoulder and neck were seen by disc.  It shows some spurs in the C3-C4 and C5-C6 area.  Shoulder was  normal.  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  Recommend she see a neck specialist regarding her cervical spine.

## 2011-01-05 NOTE — Patient Instructions (Addendum)
Start physical therapy for the  neck and shoulder.  Diagnosis rotator cuff tendinitis, LEFT shoulder. Diagnosis cervical disc disease.   We will have Dr. Lysbeth Galas refer you to a neck specialist for your neck pain and paresthesias down your arm.  See your Dr for future refills.

## 2011-01-31 ENCOUNTER — Other Ambulatory Visit: Payer: Self-pay | Admitting: Orthopedic Surgery

## 2011-01-31 DIAGNOSIS — R52 Pain, unspecified: Secondary | ICD-10-CM

## 2011-02-06 ENCOUNTER — Ambulatory Visit (HOSPITAL_COMMUNITY): Payer: Medicare Other

## 2011-03-29 ENCOUNTER — Other Ambulatory Visit: Payer: Self-pay | Admitting: Internal Medicine

## 2011-03-29 ENCOUNTER — Other Ambulatory Visit: Payer: Self-pay | Admitting: Orthopedic Surgery

## 2011-04-12 ENCOUNTER — Other Ambulatory Visit: Payer: Self-pay | Admitting: *Deleted

## 2011-04-12 DIAGNOSIS — M5020 Other cervical disc displacement, unspecified cervical region: Secondary | ICD-10-CM

## 2011-04-12 DIAGNOSIS — M75102 Unspecified rotator cuff tear or rupture of left shoulder, not specified as traumatic: Secondary | ICD-10-CM

## 2011-04-12 MED ORDER — CYCLOBENZAPRINE HCL 10 MG PO TABS: 10.0000 mg | ORAL_TABLET | Freq: Three times a day (TID) | ORAL | Status: DC | PRN

## 2011-05-30 ENCOUNTER — Other Ambulatory Visit: Payer: Self-pay | Admitting: Orthopedic Surgery

## 2011-05-30 DIAGNOSIS — R52 Pain, unspecified: Secondary | ICD-10-CM

## 2011-06-26 ENCOUNTER — Other Ambulatory Visit: Payer: Self-pay | Admitting: Orthopedic Surgery

## 2011-06-26 NOTE — Telephone Encounter (Signed)
We will have Dr. Lysbeth Galas refer you to a neck specialist for your neck pain and paresthesias down your arm.  See your Dr for future refills.   Last note above   Call primary care physician for pain meds   For shoulder problem use OTC meds   For paresthesias and neck related issues please discuss with PMD   No further appts needed with [place]

## 2011-06-27 ENCOUNTER — Other Ambulatory Visit: Payer: Self-pay | Admitting: Orthopedic Surgery

## 2011-07-03 ENCOUNTER — Encounter: Payer: Self-pay | Admitting: Gastroenterology

## 2011-07-11 ENCOUNTER — Ambulatory Visit: Payer: Medicare Other | Admitting: Urgent Care

## 2011-08-07 ENCOUNTER — Telehealth: Payer: Self-pay

## 2011-08-07 NOTE — Telephone Encounter (Signed)
Pt called- she has lost her bottle of protonix that she just filled. I called the pharmacy, her insurance will not pay for it again this month. She will have to wait until the end of May before she can get it again. The rx costs 68.08 and pt cannot afford it. Can we give samples of a ppi for pt for 1 month until she can get rx again? Please advise.

## 2011-08-08 NOTE — Telephone Encounter (Signed)
May give Dexilant 60mg  daily, #30. Patient needs f/u OV. Last seen 09/2011.

## 2011-08-08 NOTE — Telephone Encounter (Signed)
Pt aware, please schedule ov 

## 2011-08-08 NOTE — Telephone Encounter (Signed)
Tried to call pt- LM with female to return call- samples at front desk.

## 2011-08-16 ENCOUNTER — Encounter: Payer: Self-pay | Admitting: Internal Medicine

## 2011-08-16 NOTE — Telephone Encounter (Signed)
Mailed letter to patient to call office to set up OV °

## 2011-09-17 DIAGNOSIS — J9600 Acute respiratory failure, unspecified whether with hypoxia or hypercapnia: Secondary | ICD-10-CM

## 2011-09-22 ENCOUNTER — Ambulatory Visit (INDEPENDENT_AMBULATORY_CARE_PROVIDER_SITE_OTHER): Payer: Medicare Other | Admitting: Pulmonary Disease

## 2011-09-22 ENCOUNTER — Encounter: Payer: Self-pay | Admitting: Pulmonary Disease

## 2011-09-22 VITALS — BP 140/82 | HR 100 | Temp 98.6°F | Ht 65.0 in | Wt 213.0 lb

## 2011-09-22 DIAGNOSIS — Z720 Tobacco use: Secondary | ICD-10-CM | POA: Insufficient documentation

## 2011-09-22 DIAGNOSIS — J441 Chronic obstructive pulmonary disease with (acute) exacerbation: Secondary | ICD-10-CM

## 2011-09-22 DIAGNOSIS — R0602 Shortness of breath: Secondary | ICD-10-CM

## 2011-09-22 DIAGNOSIS — F17200 Nicotine dependence, unspecified, uncomplicated: Secondary | ICD-10-CM

## 2011-09-22 NOTE — Assessment & Plan Note (Addendum)
You have moderate COPD due to smoking Your lung capacity is at 54%  You have to STOP smoking !!! Decrease prednisone as directed Ok to complete course of antibiotic Stay on spiriva & dulera until next visit - this can be made at [facility] can be initiated in the future.

## 2011-09-22 NOTE — Patient Instructions (Addendum)
You have moderate COPD due to smoking Your lung capacity is at 54%  You have to STOP smoking !!! Decrease prednisone as directed Ok to complete course of antibiotic Stay on spiriva & dulera until next visit - this can be made at [facility]

## 2011-09-22 NOTE — Assessment & Plan Note (Signed)
She was counseled about tobacco cessation and strategies were discussed. She will do away with her cigarettes for now. Clearly this was tied in with her mental illness. She sees her counselor next week. I doubt she is a candidate for Chantix. She was to avoid nicotine patches due to prior history of muscle aches with this. She can try nicotine gum instead.

## 2011-09-22 NOTE — Progress Notes (Signed)
Subjective:    Patient ID: Katelyn Thompson, female    DOB: [birth year], 46 y.o.   MRN: [id number]  HPI PCP - N[place]  46 year old smoker with bipolar disorder presents for evaluation after recent admission to [facility] on 09/17/2011 for acute exacerbation of COPD. She was admitted with fevers, green sputum and worsening dyspnea with bronchospasm. Chest x-ray did not show any acute infiltrates or effusions. Arterial blood gas on admission was 7.42/40/59 on room air with a carboxyhemoglobin of 5.4 . 6 minute walk test on 09/19/2011 showed desaturation from 97-92%. Nocturnal oximetry showed desaturation up to 3 minutes with a low saturation of 79% She was seen by pulmonary (henderson)  treated with steroids, antibiotics and discharged on a prednisone taper. She has 4-5 exacerbations per year mostly treated as outpatient and this was her first hospitalization. She was maintained on a regimen of dulera and daliresp. On a followup visit with her PCP, Spiriva was added and she was given Levaquin for another 10 days. He smoked up to 3 packs per day but had cut down to about one pack per day.  She has not smoked since her admission to the hospital. She is using electronic cigarette. Nicotine patch in the past had caused her itching and muscle aches. Spirometry showed FEV1 of 54% with FEC of 65% and ratio of 68 this showed moderate airway obstruction. She is on Seroquel, Lamictal, and carbamazepine for bipolar and sees her counselor next week. She she sees Dr. Threasa Beards from [facility] neurology for multiple sclerosis and takes gabapentin for foot pain and a muscle relaxant. She also reports depression and anxiety. She denies nocturnal wheezing, pedal edema, orthopnea or paroxysmal nocturnal dyspnea. Anxiety and anger makes her breathing worse. She reports mild amount of yellow sputum. She reports loud snoring and frequent awakenings but denies excessive daytime somnolence. She is disabled.  She had EGD in March '12 with  new diagnosis of short segment Barrett's esophagus without dysplasia.  Past Medical History  Diagnosis Date  . GERD (gastroesophageal reflux disease)   . COPD (chronic obstructive pulmonary disease)   . HTN (hypertension)   . Nerve damage     Severe to left foot  . Bipolar 1 disorder   . Anxiety and depression     Does not like being around lots of people  . Hypercholesteremia   . MS (multiple sclerosis)     but not offcially disgnosed  . Barrett's esophagus without dysplasia     Past Surgical History  Procedure Date  . S/p hysterectomy   . Tubal ligation   . Sinus surgery with instatrak   . Foot surgery     x 2  . Esophagogastroduodenoscopy 06/2010    diagnosed with Barrett's, small hh, esophagus dilated. Next EGD 06/2011    Allergies  Allergen Reactions  . Aspirin   . Nitrofurantoin   . Relafen (Nabumetone)     History   Social History  . Marital Status: Married    Spouse Name: N/A    Number of Children: N/A  . Years of Education: 9th grade   Occupational History  . disabled    Social History Main Topics  . Smoking status: Former Smoker -- 2.0 packs/day for 34 years    Types: Cigarettes    Quit date: 09/17/2011  . Smokeless tobacco: Never Used   Comment: up to 3 ppd  . Alcohol Use: No  . Drug Use: No  . Sexually Active: Not on file   Other Topics Concern  .  Not on file   Social History Narrative  . No narrative on file     Review of Systems  Constitutional: Positive for unexpected weight change. Negative for fever and appetite change.  HENT: Positive for ear pain, congestion, sore throat, sneezing and trouble swallowing. Negative for dental problem.   Respiratory: Positive for cough and shortness of breath.   Cardiovascular: Positive for chest pain, palpitations and leg swelling.  Gastrointestinal: Positive for abdominal pain.  Musculoskeletal: Positive for joint swelling and arthralgias.  Skin: Positive for color change. Negative for rash.    Neurological: Positive for headaches.  Psychiatric/Behavioral: Positive for dysphoric mood. The patient is nervous/anxious.        Objective:   Physical Exam  Gen. Pleasant, obese, in no distress, normal affect ENT - no lesions, no post nasal drip, class 2-3 airway Neck: No JVD, no thyromegaly, no carotid bruits Lungs: no use of accessory muscles, no dullness to percussion, decreased without rales or rhonchi  Cardiovascular: Rhythm regular, heart sounds  normal, no murmurs or gallops, no peripheral edema Abdomen: soft and non-tender, no hepatosplenomegaly, BS normal. Musculoskeletal: No deformities, no cyanosis or clubbing Neuro:  alert, non focal, no tremors       Assessment & Plan:

## 2011-10-30 ENCOUNTER — Other Ambulatory Visit: Payer: Self-pay | Admitting: Orthopedic Surgery

## 2011-10-30 DIAGNOSIS — R252 Cramp and spasm: Secondary | ICD-10-CM

## 2011-11-02 ENCOUNTER — Other Ambulatory Visit: Payer: Self-pay | Admitting: Internal Medicine

## 2011-11-02 ENCOUNTER — Encounter: Payer: Self-pay | Admitting: Gastroenterology

## 2011-11-02 ENCOUNTER — Ambulatory Visit (INDEPENDENT_AMBULATORY_CARE_PROVIDER_SITE_OTHER): Payer: Medicare Other | Admitting: Gastroenterology

## 2011-11-02 VITALS — BP 150/93 | HR 95 | Temp 98.1°F | Ht 65.0 in | Wt 220.6 lb

## 2011-11-02 DIAGNOSIS — R1310 Dysphagia, unspecified: Secondary | ICD-10-CM

## 2011-11-02 DIAGNOSIS — K5900 Constipation, unspecified: Secondary | ICD-10-CM

## 2011-11-02 DIAGNOSIS — K2270 Barrett's esophagus without dysplasia: Secondary | ICD-10-CM

## 2011-11-02 DIAGNOSIS — K625 Hemorrhage of anus and rectum: Secondary | ICD-10-CM

## 2011-11-02 DIAGNOSIS — R1314 Dysphagia, pharyngoesophageal phase: Secondary | ICD-10-CM

## 2011-11-02 DIAGNOSIS — K5909 Other constipation: Secondary | ICD-10-CM

## 2011-11-02 MED ORDER — LUBIPROSTONE 24 MCG PO CAPS: 24.0000 ug | ORAL_CAPSULE | Freq: Two times a day (BID) | ORAL | Status: AC

## 2011-11-02 MED ORDER — HYDROCORTISONE ACETATE 25 MG RE SUPP: 25.0000 mg | Freq: Two times a day (BID) | RECTAL | Status: AC

## 2011-11-02 MED ORDER — PANTOPRAZOLE SODIUM 40 MG PO TBEC: 40.0000 mg | DELAYED_RELEASE_TABLET | Freq: Two times a day (BID) | ORAL | Status: DC

## 2011-11-02 MED ORDER — PEG 3350-KCL-NA BICARB-NACL 420 G PO SOLR: ORAL | Status: AC

## 2011-11-02 NOTE — Progress Notes (Signed)
Faxed to PCP

## 2011-11-02 NOTE — Patient Instructions (Addendum)
We have set you up for an upper endoscopy and colonoscopy with Dr. Jena Gauss.  You may use the Anusol suppositories twice a day for 1 week. This has been sent to your pharmacy.  Increase Protonix to twice a day, 30 minutes before breakfast and dinner.  Review the high fiber diet and follow.  Start taking Amitiza WITH FOOD twice a day. Prescription has been sent to your pharmacy.   Further recommendations once procedures are completed.    High Fiber Diet A high fiber diet changes your normal diet to include more whole grains, legumes, fruits, and vegetables. Changes in the diet involve replacing refined carbohydrates with unrefined foods. The calorie level of the diet is essentially unchanged. The Dietary Reference Intake (recommended amount) for adult males is 38 g per day. For adult females, it is 25 g per day. Pregnant and lactating women should consume 28 g of fiber per day. Fiber is the intact part of a plant that is not broken down during digestion. Functional fiber is fiber that has been isolated from the plant to provide a beneficial effect in the body. PURPOSE  Increase stool bulk.   Ease and regulate bowel movements.   Lower cholesterol.  INDICATIONS THAT YOU NEED MORE FIBER  Constipation and hemorrhoids.   Uncomplicated diverticulosis (intestine condition) and irritable bowel syndrome.   Weight management.   As a protective measure against hardening of the arteries (atherosclerosis), diabetes, and cancer.  NOTE OF CAUTION If you have a digestive or bowel problem, ask your caregiver for advice before adding high fiber foods to your diet. Some of the following medical problems are such that a high fiber diet should not be used without consulting your caregiver:  Acute diverticulitis (intestine infection).   Partial small bowel obstructions.   Complicated diverticular disease involving bleeding, rupture (perforation), or abscess (boil, furuncle).   Presence of  autonomic neuropathy (nerve damage) or gastric paresis (stomach cannot empty itself).  GUIDELINES FOR INCREASING FIBER  Start adding fiber to the diet slowly. A gradual increase of about 5 more grams (2 slices of whole-wheat bread, 2 servings of most fruits or vegetables, or 1 bowl of high fiber cereal) per day is best. Too rapid an increase in fiber may result in constipation, flatulence, and bloating.   Drink enough water and fluids to keep your urine clear or pale yellow. Water, juice, or caffeine-free drinks are recommended. Not drinking enough fluid may cause constipation.   Eat a variety of high fiber foods rather than one type of fiber.   Try to increase your intake of fiber through using high fiber foods rather than fiber pills or supplements that contain small amounts of fiber.   The goal is to change the types of food eaten. Do not supplement your present diet with high fiber foods, but replace foods in your present diet.  INCLUDE A VARIETY OF FIBER SOURCES  Replace refined and processed grains with whole grains, canned fruits with fresh fruits, and incorporate other fiber sources. White rice, white breads, and most bakery goods contain little or no fiber.   Brown whole-grain rice, buckwheat oats, and many fruits and vegetables are all good sources of fiber. These include: broccoli, Brussels sprouts, cabbage, cauliflower, beets, sweet potatoes, white potatoes (skin on), carrots, tomatoes, eggplant, squash, berries, fresh fruits, and dried fruits.   Cereals appear to be the richest source of fiber. Cereal fiber is found in whole grains and bran. Bran is the fiber-rich outer coat of cereal grain,  which is largely removed in refining. In whole-grain cereals, the bran remains. In breakfast cereals, the largest amount of fiber is found in those with "bran" in their names. The fiber content is sometimes indicated on the label.   You may need to include additional fruits and vegetables each  day.   In baking, for 1 cup white flour, you may use the following substitutions:   1 cup whole-wheat flour minus 2 tbs.    cup white flour plus  cup whole-wheat flour.  Document Released: 03/27/2005 Document Revised: 03/16/2011 Document Reviewed: 02/02/2009 [facility] Patient Information 2012 [place], [place].

## 2011-11-02 NOTE — Assessment & Plan Note (Signed)
In the setting of chronic constipation. Likely r/t benign anorectal source. Last TCS unable to be retrieved from [facility] despite pt stating she has completed this, greater than 10 years ago. Due for screening. Will provide Anusol suppositories in interim, aggressive bowel regimen.   Anusol X 7 days TCS High fiber diet

## 2011-11-02 NOTE — Progress Notes (Signed)
Referring Provider: Josue Hector,* Primary Care Physician:  Josue Hector, MD Primary Gastroenterologist: Dr. Jena Gauss   Chief Complaint  Patient presents with  . Dysphagia    HPI:   46 year old female with hx of Barrett's esophagus, dysphagia, constipation, presents to set up surveillance EGD. Original diagnosis from March 2012, no dysplasia, no EOE. Noted dysphagia at that time, empirically dilated. Reports recurrence of dysphagia. Spicy foods cause esophageal burning. Severe reflux. 19 lbs gained since last visit one year ago. No improvement with Protonix daily.   Abdominal bloating, diffuse burning of abdomen, constipation. Uses enemas, suppositories, unable to have BM on won. Notes moderate to large volume hematochezia at time. Intermittent rectal discomfort. Was previously taking Amitiza po BID with improvement; stopped due to finances and inability to afford copay. Last TCS in remote past, unable to retrieve from [facility] records. Reportedly done through our office. Pt states greater than 10 years ago.  Past Medical History  Diagnosis Date  . GERD (gastroesophageal reflux disease)   . COPD (chronic obstructive pulmonary disease)   . HTN (hypertension)   . Nerve damage     Severe to left foot  . Bipolar 1 disorder   . Anxiety and depression     Does not like being around lots of people  . Hypercholesteremia   . MS (multiple sclerosis)     but not offcially disgnosed  . Barrett's esophagus without dysplasia     Past Surgical History  Procedure Date  . S/p hysterectomy   . Tubal ligation   . Sinus surgery with instatrak   . Foot surgery     x 2  . Esophagogastroduodenoscopy 06/2010    diagnosed with Barrett's, small hh, esophagus dilated. Next EGD 06/2011    Current Outpatient Prescriptions  Medication Sig Dispense Refill  . albuterol (PROVENTIL) (2.5 MG/3ML) 0.083% nebulizer solution Take 2.5 mg by nebulization 4 (four) times daily.       Marland Kitchen ALPRAZolam  (XANAX) 1 MG tablet Take 1 mg by mouth at bedtime as needed.        Marland Kitchen amitriptyline (ELAVIL) 25 MG tablet Take 25 mg by mouth at bedtime.        Marland Kitchen amLODipine (NORVASC) 5 MG tablet Take 5 mg by mouth daily.        Marland Kitchen BLACK COHOSH PO Take 175 mg by mouth 3 (three) times daily.      . carbamazepine (TEGRETOL) 200 MG tablet Take 200 mg by mouth daily.       . cyclobenzaprine (FLEXERIL) 10 MG tablet TAKE ONE TABLET BY MOUTH EVERY 8 HOURS AS NEEDED FOR MUSCLE SPASM  60 tablet  2  . dextromethorphan-guaiFENesin (MUCINEX DM) 30-600 MG per 12 hr tablet Take 1 tablet by mouth every 12 (twelve) hours.        . ergocalciferol (VITAMIN D2) 50000 UNITS capsule Take 50,000 Units by mouth once a week.        . gabapentin (NEURONTIN) 800 MG tablet Take 800 mg by mouth 3 (three) times daily.        Marland Kitchen HYDROcodone-acetaminophen (VICODIN) 5-500 MG per tablet Take 1 tablet by mouth at bedtime.  42 tablet  5  . lamoTRIgine (LAMICTAL) 100 MG tablet Take 100 mg by mouth daily.       Marland Kitchen lisinopril (PRINIVIL,ZESTRIL) 10 MG tablet Take 10 mg by mouth daily.       . meloxicam (MOBIC) 7.5 MG tablet 7.5 mg 2 (two) times daily.       Marland Kitchen  methylPREDNIsolone (MEDROL DOSPACK) 4 MG tablet 4 mg as directed.       . Mometasone Furo-Formoterol Fum (DULERA) 200-5 MCG/ACT AERO Inhale 2 puffs into the lungs 2 (two) times daily.       . pantoprazole (PROTONIX) 40 MG tablet TAKE ONE TABLET BY MOUTH EVERY DAY  30 tablet  11  . roflumilast (DALIRESP) 500 MCG TABS tablet Take 500 mcg by mouth daily.        . SEROQUEL XR 50 MG TB24 Take 50 mg by mouth at bedtime.       Marland Kitchen tiotropium (SPIRIVA) 18 MCG inhalation capsule Place 18 mcg into inhaler and inhale daily.      . traZODone (DESYREL) 150 MG tablet Take 150 mg by mouth at bedtime. 2 tablets at bed time      . dextromethorphan (DELSYM) 30 MG/5ML liquid Take 60 mg by mouth as needed.        Marland Kitchen HYDROcodone-homatropine (HYCODAN) 5-1.5 MG/5ML syrup Take by mouth every 6 (six) hours as needed.          Marland Kitchen levocetirizine (XYZAL) 5 MG tablet Take 5 mg by mouth every evening.      Marland Kitchen levofloxacin (LEVAQUIN) 500 MG tablet Take 500 mg by mouth daily.      . predniSONE (STERAPRED UNI-PAK) 10 MG tablet TAKE AS DIRECTED  48 tablet  0    Allergies as of 11/02/2011 - Review Complete 11/02/2011  Allergen Reaction Noted  . Aspirin Other (See Comments) 06/13/2010  . Nitrofurantoin  06/13/2010  . Relafen (nabumetone)  01/05/2011    Family History  Problem Relation Age of Onset  . Colon cancer Neg Hx   . Heart disease Mother   . Heart attack Brother 48    deceased  . Diabetes Mother   . Asthma Mother   . Lung disease Mother   . Arthritis Mother   . Allergies Mother     History   Social History  . Marital Status: Married    Spouse Name: N/A    Number of Children: N/A  . Years of Education: 9th grade   Occupational History  . disabled    Social History Main Topics  . Smoking status: Former Smoker -- 2.0 packs/day for 34 years    Types: Cigarettes    Quit date: 09/17/2011  . Smokeless tobacco: Never Used   Comment: up to 3 ppd  . Alcohol Use: No  . Drug Use: No  . Sexually Active: None   Other Topics Concern  . None   Social History Narrative  . None    Review of Systems: Gen: Denies fever, chills, anorexia. Denies fatigue, weakness, weight loss.  CV: Denies chest pain, palpitations, syncope, peripheral edema, and claudication. Resp: Denies dyspnea at rest, cough, wheezing, coughing up blood, and pleurisy. GI: SEE HPI Derm: Denies rash, itching, dry skin Psych: Denies depression, anxiety, memory loss, confusion. No homicidal or suicidal ideation.  Heme: Denies bruising, bleeding, and enlarged lymph nodes.  Physical Exam: BP 150/93  Pulse 95  Temp 98.1 F (36.7 C) (Temporal)  Ht 5\' 5"  (1.651 m)  Wt 220 lb 9.6 oz (100.064 kg)  BMI 36.71 kg/m2 General:   Alert and oriented. No distress noted. Pleasant and cooperative.  Head:  Normocephalic and atraumatic. Eyes:   Conjuctiva clear without scleral icterus. Mouth:  Oral mucosa pink and moist. Good dentition. No lesions. Neck:  Supple, without mass or thyromegaly. Heart:  S1, S2 present without murmurs, rubs, or gallops. Regular rate  and rhythm. Abdomen:  +BS, soft, non-tender and non-distended. No rebound or guarding. No HSM or masses noted. Central pattern obesity. Msk:  Symmetrical without gross deformities. Normal posture. Extremities:  Without edema. Neurologic:  Alert and  oriented x4;  grossly normal neurologically. Skin:  Intact without significant lesions or rashes. Cervical Nodes:  No significant cervical adenopathy. Psych:  Alert and cooperative. Normal mood and affect.

## 2011-11-02 NOTE — Assessment & Plan Note (Signed)
In the setting of multiple medications, poor dietary choices. Has improved with Amitiza 24 mcg BID historically. Finances issues in past. We will implement Amitiza again, provide assistance forms if necessary. High fiber diet discussed with patient. TCS due to moderate volume hematochezia, last TCS >10 years ago. See rectal bleeding.    Amitiza 24 mcg po BID, samples provided Proceed with TCS with Dr. Jena Gauss in near future: the risks, benefits, and alternatives have been discussed with the patient in detail. The patient states understanding and desires to proceed. PHENERGAN 25 MG ON CALL DUE TO POLYPHARMACY

## 2011-11-02 NOTE — Assessment & Plan Note (Signed)
Last EGD March 2012 with empiric dilation. Likely culprit uncontrolled GERD. Pt has gained 19 lbs in past year. Proceed with dilation as necessary during upcoming EGD.

## 2011-11-02 NOTE — Assessment & Plan Note (Signed)
46 year old with severe reflux, diagnosed with Barrett's March 2012. Somewhat overdue for initial surveillance EGD. Also notes recurrence of dysphagia, s/p empiric dilation at last EGD. UGI last year without mechanical issues, noted severe GERD. Likely culprit of continued dysphagia. As of note, negative for EOE March 2012.   Proceed with upper endoscopy and dilation in the near future with Dr. Jena Gauss. The risks, benefits, and alternatives have been discussed in detail with patient. They have stated understanding and desire to proceed.  Increase Protonix to BID PPI indefinitely PHENERGAN 25 mg ON CALL DUE TO POLYPHARMACY WEIGHT LOSS EFFORTS DISCUSSED WITH PATIENT (19 POUNDS GAINED IN 1 YEAR)

## 2011-11-09 HISTORY — PX: ESOPHAGOGASTRODUODENOSCOPY: SHX1529

## 2011-11-14 ENCOUNTER — Encounter: Payer: Medicare Other | Admitting: Internal Medicine

## 2011-11-14 NOTE — Progress Notes (Signed)
 This encounter was created in error - please disregard.

## 2011-11-20 ENCOUNTER — Encounter (HOSPITAL_COMMUNITY): Payer: Self-pay | Admitting: *Deleted

## 2011-11-20 ENCOUNTER — Encounter (HOSPITAL_COMMUNITY)
Admission: RE | Disposition: A | Discharge status: Home / Self Care | Payer: Self-pay | Source: Ambulatory Visit | Attending: Internal Medicine

## 2011-11-20 ENCOUNTER — Ambulatory Visit (HOSPITAL_COMMUNITY)
Admission: RE | Admit: 2011-11-20 | Discharge: 2011-11-20 | Disposition: A | Discharge status: Home / Self Care | Payer: Medicare Other | Source: Ambulatory Visit | Attending: Internal Medicine | Admitting: Internal Medicine

## 2011-11-20 DIAGNOSIS — K449 Diaphragmatic hernia without obstruction or gangrene: Secondary | ICD-10-CM

## 2011-11-20 DIAGNOSIS — K648 Other hemorrhoids: Secondary | ICD-10-CM | POA: Insufficient documentation

## 2011-11-20 DIAGNOSIS — Z79899 Other long term (current) drug therapy: Secondary | ICD-10-CM | POA: Insufficient documentation

## 2011-11-20 DIAGNOSIS — I10 Essential (primary) hypertension: Secondary | ICD-10-CM | POA: Insufficient documentation

## 2011-11-20 DIAGNOSIS — K228 Other specified diseases of esophagus: Secondary | ICD-10-CM

## 2011-11-20 DIAGNOSIS — R1310 Dysphagia, unspecified: Secondary | ICD-10-CM

## 2011-11-20 DIAGNOSIS — E7800 Pure hypercholesterolemia, unspecified: Secondary | ICD-10-CM | POA: Insufficient documentation

## 2011-11-20 DIAGNOSIS — K921 Melena: Secondary | ICD-10-CM | POA: Insufficient documentation

## 2011-11-20 DIAGNOSIS — J449 Chronic obstructive pulmonary disease, unspecified: Secondary | ICD-10-CM | POA: Insufficient documentation

## 2011-11-20 DIAGNOSIS — J4489 Other specified chronic obstructive pulmonary disease: Secondary | ICD-10-CM | POA: Insufficient documentation

## 2011-11-20 DIAGNOSIS — K2270 Barrett's esophagus without dysplasia: Secondary | ICD-10-CM

## 2011-11-20 DIAGNOSIS — K5900 Constipation, unspecified: Secondary | ICD-10-CM

## 2011-11-20 HISTORY — PX: COLONOSCOPY: SHX5424

## 2011-11-20 SURGERY — COLONOSCOPY
Anesthesia: Moderate Sedation

## 2011-11-20 MED ORDER — ALBUTEROL SULFATE (5 MG/ML) 0.5% IN NEBU
2.5000 mg | INHALATION_SOLUTION | Freq: Once | RESPIRATORY_TRACT | Status: AC
  Administered 2011-11-20: 2.5 mg via RESPIRATORY_TRACT

## 2011-11-20 MED ORDER — PROMETHAZINE HCL 25 MG/ML IJ SOLN
INTRAMUSCULAR | Status: AC
  Filled 2011-11-20: qty 1

## 2011-11-20 MED ORDER — MIDAZOLAM HCL 5 MG/5ML IJ SOLN
INTRAMUSCULAR | Status: AC
  Filled 2011-11-20: qty 10

## 2011-11-20 MED ORDER — ALBUTEROL SULFATE (5 MG/ML) 0.5% IN NEBU
INHALATION_SOLUTION | RESPIRATORY_TRACT | Status: AC
  Filled 2011-11-20: qty 0.5

## 2011-11-20 MED ORDER — MEPERIDINE HCL 100 MG/ML IJ SOLN
INTRAMUSCULAR | Status: AC
  Filled 2011-11-20: qty 2

## 2011-11-20 MED ORDER — STERILE WATER FOR IRRIGATION IR SOLN
Status: DC | PRN
  Administered 2011-11-20: 10:00:00

## 2011-11-20 MED ORDER — PROMETHAZINE HCL 25 MG/ML IJ SOLN
25.0000 mg | Freq: Once | INTRAMUSCULAR | Status: AC
  Administered 2011-11-20: 25 mg via INTRAVENOUS

## 2011-11-20 MED ORDER — BUTAMBEN-TETRACAINE-BENZOCAINE 2-2-14 % EX AERO
INHALATION_SPRAY | CUTANEOUS | Status: DC | PRN
  Administered 2011-11-20: 2 via TOPICAL

## 2011-11-20 MED ORDER — MIDAZOLAM HCL 5 MG/5ML IJ SOLN
INTRAMUSCULAR | Status: DC | PRN
  Administered 2011-11-20 (×2): 2 mg via INTRAVENOUS
  Administered 2011-11-20: 1 mg via INTRAVENOUS
  Administered 2011-11-20: 2 mg via INTRAVENOUS
  Administered 2011-11-20 (×2): 1 mg via INTRAVENOUS

## 2011-11-20 MED ORDER — MEPERIDINE HCL 100 MG/ML IJ SOLN
INTRAMUSCULAR | Status: DC | PRN
  Administered 2011-11-20: 25 mg via INTRAVENOUS
  Administered 2011-11-20 (×2): 50 mg via INTRAVENOUS
  Administered 2011-11-20: 25 mg via INTRAVENOUS

## 2011-11-20 MED ORDER — SODIUM CHLORIDE 0.9 % IJ SOLN
INTRAMUSCULAR | Status: AC
  Filled 2011-11-20: qty 10

## 2011-11-20 MED ORDER — SODIUM CHLORIDE 0.9 % IN NEBU
INHALATION_SOLUTION | RESPIRATORY_TRACT | Status: AC
  Filled 2011-11-20: qty 3

## 2011-11-20 MED ORDER — SODIUM CHLORIDE 0.45 % IV SOLN
Freq: Once | INTRAVENOUS | Status: AC
  Administered 2011-11-20: 10:00:00 via INTRAVENOUS

## 2011-11-20 NOTE — OR Nursing (Signed)
Pt arrived to endo ambulatory.  Stated she felt tight and need her breathing tx but she was running late and forgot.  Dr Jena Gauss notified,  Neb Tx ordered.  Tolerated neb tx well.  Encouraged to deep breath and cough.  Lungs clear bil.

## 2011-11-20 NOTE — H&P (View-Only) (Signed)
Referring Provider: Nyland, Leonard Robert,* Primary Care Physician:  NYLAND,LEONARD ROBERT, MD Primary Gastroenterologist: Dr. Rourk   Chief Complaint  Patient presents with  . Dysphagia    HPI:   46-year-old female with hx of Barrett's esophagus, dysphagia, constipation, presents to set up surveillance EGD. Original diagnosis from March 2012, no dysplasia, no EOE. Noted dysphagia at that time, empirically dilated. Reports recurrence of dysphagia. Spicy foods cause esophageal burning. Severe reflux. 19 lbs gained since last visit one year ago. No improvement with Protonix daily.   Abdominal bloating, diffuse burning of abdomen, constipation. Uses enemas, suppositories, unable to have BM on won. Notes moderate to large volume hematochezia at time. Intermittent rectal discomfort. Was previously taking Amitiza 24mcg po BID with improvement; stopped due to finances and inability to afford copay. Last TCS in remote past, unable to retrieve from [facility] records. Reportedly done through our office. Pt states greater than 10 years ago.  Past Medical History  Diagnosis Date  . GERD (gastroesophageal reflux disease)   . COPD (chronic obstructive pulmonary disease)   . HTN (hypertension)   . Nerve damage     Severe to left foot  . Bipolar 1 disorder   . Anxiety and depression     Does not like being around lots of people  . Hypercholesteremia   . MS (multiple sclerosis)     but not offcially disgnosed  . Barrett's esophagus without dysplasia     Past Surgical History  Procedure Date  . S/p hysterectomy   . Tubal ligation   . Sinus surgery with instatrak   . Foot surgery     x 2  . Esophagogastroduodenoscopy 06/2010    diagnosed with Barrett's, small hh, esophagus dilated. Next EGD 06/2011    Current Outpatient Prescriptions  Medication Sig Dispense Refill  . albuterol (PROVENTIL) (2.5 MG/3ML) 0.083% nebulizer solution Take 2.5 mg by nebulization 4 (four) times daily.       . ALPRAZolam  (XANAX) 1 MG tablet Take 1 mg by mouth at bedtime as needed.        . amitriptyline (ELAVIL) 25 MG tablet Take 25 mg by mouth at bedtime.        . amLODipine (NORVASC) 5 MG tablet Take 5 mg by mouth daily.        . BLACK COHOSH PO Take 175 mg by mouth 3 (three) times daily.      . carbamazepine (TEGRETOL) 200 MG tablet Take 200 mg by mouth daily.       . cyclobenzaprine (FLEXERIL) 10 MG tablet TAKE ONE TABLET BY MOUTH EVERY 8 HOURS AS NEEDED FOR MUSCLE SPASM  60 tablet  2  . dextromethorphan-guaiFENesin (MUCINEX DM) 30-600 MG per 12 hr tablet Take 1 tablet by mouth every 12 (twelve) hours.        . ergocalciferol (VITAMIN D2) 50000 UNITS capsule Take 50,000 Units by mouth once a week.        . gabapentin (NEURONTIN) 800 MG tablet Take 800 mg by mouth 3 (three) times daily.        . HYDROcodone-acetaminophen (VICODIN) 5-500 MG per tablet Take 1 tablet by mouth at bedtime.  42 tablet  5  . lamoTRIgine (LAMICTAL) 100 MG tablet Take 100 mg by mouth daily.       . lisinopril (PRINIVIL,ZESTRIL) 10 MG tablet Take 10 mg by mouth daily.       . meloxicam (MOBIC) 7.5 MG tablet 7.5 mg 2 (two) times daily.       .   methylPREDNIsolone (MEDROL DOSPACK) 4 MG tablet 4 mg as directed.       . Mometasone Furo-Formoterol Fum (DULERA) 200-5 MCG/ACT AERO Inhale 2 puffs into the lungs 2 (two) times daily.       . pantoprazole (PROTONIX) 40 MG tablet TAKE ONE TABLET BY MOUTH EVERY DAY  30 tablet  11  . roflumilast (DALIRESP) 500 MCG TABS tablet Take 500 mcg by mouth daily.        . SEROQUEL XR 50 MG TB24 Take 50 mg by mouth at bedtime.       . tiotropium (SPIRIVA) 18 MCG inhalation capsule Place 18 mcg into inhaler and inhale daily.      . traZODone (DESYREL) 150 MG tablet Take 150 mg by mouth at bedtime. 2 tablets at bed time      . dextromethorphan (DELSYM) 30 MG/5ML liquid Take 60 mg by mouth as needed.        . HYDROcodone-homatropine (HYCODAN) 5-1.5 MG/5ML syrup Take by mouth every 6 (six) hours as needed.          . levocetirizine (XYZAL) 5 MG tablet Take 5 mg by mouth every evening.      . levofloxacin (LEVAQUIN) 500 MG tablet Take 500 mg by mouth daily.      . predniSONE (STERAPRED UNI-PAK) 10 MG tablet TAKE AS DIRECTED  48 tablet  0    Allergies as of 11/02/2011 - Review Complete 11/02/2011  Allergen Reaction Noted  . Aspirin Other (See Comments) 06/13/2010  . Nitrofurantoin  06/13/2010  . Relafen (nabumetone)  01/05/2011    Family History  Problem Relation Age of Onset  . Colon cancer Neg Hx   . Heart disease Mother   . Heart attack Brother 48    deceased  . Diabetes Mother   . Asthma Mother   . Lung disease Mother   . Arthritis Mother   . Allergies Mother     History   Social History  . Marital Status: Married    Spouse Name: N/A    Number of Children: N/A  . Years of Education: 9th grade   Occupational History  . disabled    Social History Main Topics  . Smoking status: Former Smoker -- 2.0 packs/day for 34 years    Types: Cigarettes    Quit date: 09/17/2011  . Smokeless tobacco: Never Used   Comment: up to 3 ppd  . Alcohol Use: No  . Drug Use: No  . Sexually Active: None   Other Topics Concern  . None   Social History Narrative  . None    Review of Systems: Gen: Denies fever, chills, anorexia. Denies fatigue, weakness, weight loss.  CV: Denies chest pain, palpitations, syncope, peripheral edema, and claudication. Resp: Denies dyspnea at rest, cough, wheezing, coughing up blood, and pleurisy. GI: SEE HPI Derm: Denies rash, itching, dry skin Psych: Denies depression, anxiety, memory loss, confusion. No homicidal or suicidal ideation.  Heme: Denies bruising, bleeding, and enlarged lymph nodes.  Physical Exam: BP 150/93  Pulse 95  Temp 98.1 F (36.7 C) (Temporal)  Ht 5' 5" (1.651 m)  Wt 220 lb 9.6 oz (100.064 kg)  BMI 36.71 kg/m2 General:   Alert and oriented. No distress noted. Pleasant and cooperative.  Head:  Normocephalic and atraumatic. Eyes:   Conjuctiva clear without scleral icterus. Mouth:  Oral mucosa pink and moist. Good dentition. No lesions. Neck:  Supple, without mass or thyromegaly. Heart:  S1, S2 present without murmurs, rubs, or gallops. Regular rate   and rhythm. Abdomen:  +BS, soft, non-tender and non-distended. No rebound or guarding. No HSM or masses noted. Central pattern obesity. Msk:  Symmetrical without gross deformities. Normal posture. Extremities:  Without edema. Neurologic:  Alert and  oriented x4;  grossly normal neurologically. Skin:  Intact without significant lesions or rashes. Cervical Nodes:  No significant cervical adenopathy. Psych:  Alert and cooperative. Normal mood and affect.  

## 2011-11-20 NOTE — Op Note (Signed)
[facility] [address] [place], [place]  [address]  ENDOSCOPY PROCEDURE REPORT  PATIENT:  Katelyn Thompson, Katelyn Thompson  MR#:  [id number] BIRTHDATE:  [birth year], 46 yrs. old  GENDER:  female  ENDOSCOPIST:  R. Roetta Sessions, MD Caleen Essex Referred by:  Joette Catching, M.D.  PROCEDURE DATE:  11/20/2011 PROCEDURE:  EGD with Elease Hashimoto dilation followed by esophageal biopsy  INDICATIONS:   esophageal dysphagia; history of Barrett's esophagus.  INFORMED CONSENT:   The risks, benefits, limitations, alternatives and imponderables have been discussed.  The potential for biopsy, esophogeal dilation, etc. have also been reviewed.  Questions have been answered.  All parties agreeable.  Please see the history and physical in the medical record for more information.  MEDICATIONS:     Versed 7 mg IV and Demerol 125 mg IV in divided doses. Phenergan 25 mg IV to augment conscious sedation. Cetacaine spray.  DESCRIPTION OF PROCEDURE:   The EG-[id number] ([id number]) endoscope was introduced through the mouth and advanced to the second portion of the duodenum without difficulty or limitations.  The mucosal surfaces were surveyed very carefully during advancement of the scope and upon withdrawal.  Retroflexion view of the proximal stomach and esophagogastric junction was performed.  <<PROCEDUREIMAGES>>  FINDINGS:  Salmon-colored epithelium coming up 2 cm above the GE junction. Undulating border. No esophagitis. No tumor seen. Tubular esophagus widely patent throughout its course. Stomach empty. Gastric mucosa appeared normal.  Small hiatal     hernia. Pylorus patent. Normal first and second portion of the duodenum.  THERAPEUTIC / DIAGNOSTIC MANEUVERS PERFORMED:   A 54 French Maloney dilator was passed to full insertion easily without resistance. A look back revealed no apparent complication related to this maneuver. Subsequently, biopsies of the abnormal distal esophageal mucosa were taken for  histologic study.  COMPLICATIONS:   None  IMPRESSION:    Abnormal distal esophagus consistent with prior diagnosis of short segment Barrett's esophagus-status post passage of          a Maloney dilator followed by esophageal biopsy. Small hiatal hernia.  RECOMMENDATIONS:     Weight loss encouraged. Followup on pathology. Continue chronic acid suppression therapy. See colonoscopy report.  ______________________________ R. Roetta Sessions, MD Caleen Essex  CC:  n. eSIGNED:   R. Roetta Sessions at 11/20/2011 11:02 AM  Cecelia Byars, [phone]

## 2011-11-20 NOTE — Op Note (Signed)
[facility] [address] [place], [place]  [address]  COLONOSCOPY PROCEDURE REPORT  PATIENT:  Katelyn Thompson, Katelyn Thompson  MR#:  [id number] BIRTHDATE:  1966-03-08, 46 yrs. old  GENDER:  female ENDOSCOPIST:  R. Roetta Sessions, MD FACP [facility] REF. BY:  Joette Catching, M.D. PROCEDURE DATE:  11/20/2011 PROCEDURE:  Diagnostic colonoscopy  INDICATIONS:  Hematochezia; last colonoscopy over 10 years ago. Chronic constipation not improved with Amitiza  INFORMED CONSENT:  The risks, benefits, alternatives and imponderables including but not limited to bleeding, perforation as well as the possibility of a missed lesion have been reviewed. The potential for biopsy, lesion removal, etc. have also been discussed.  Questions have been answered.  All parties agreeable. Please see the history and physical in the medical record for more information.  MEDICATIONS:  Versed 9 mg IV Demerol 150 mg IV in divided doses. Phenergan 25 mg IV to augment conscious sedation  DESCRIPTION OF PROCEDURE:  After a digital rectal exam was performed, the EC-[id number] ([id number]) colonoscope was advanced from the anus through the rectum and colon to the area of the cecum, ileocecal valve and appendiceal orifice.  The cecum was deeply intubated.  These structures were well-seen and photographed for the record.  From the level of the cecum and ileocecal valve, the scope was slowly and cautiously withdrawn.  The mucosal surfaces were carefully surveyed utilizing scope tip deflection to facilitate fold flattening as needed.  The scope was pulled down into the rectum where a thorough examination including retroflexion was performed. <<PROCEDUREIMAGES>>  FINDINGS: Suboptimal preparation. Colonic mucosa appeared normal although there was quite a bit of tenacious stool stained mucus throughout the colon which made the exam more difficult.  THERAPEUTIC / DIAGNOSTIC MANEUVERS PERFORMED: None  COMPLICATIONS:  None  CECAL  WITHDRAWAL TIME: 12 minutes  IMPRESSION:  Friable anorectum; single anal canal hemorrhoidal tag. Otherwise normal rectum and colon  RECOMMENDATIONS:  Continue fiber supplement. Stop Amitiza. Begin MiraLax 17 g orally twice daily as needed for constipation. See EGD                      report.  Office visit with [place] in 3 months.  ______________________________ R. Roetta Sessions, MD Caleen Essex  CC:  Joette Catching, M.D.  n. eSIGNED:   R. Roetta Sessions at 11/20/2011 11:38 AM  Cecelia Byars, [phone]

## 2011-11-20 NOTE — Interval H&P Note (Signed)
History and Physical Interval Note:  11/20/2011 10:31 AM  Katelyn Thompson  has presented today for surgery, with the diagnosis of dysphagia and barrets esophagus  The various methods of treatment have been discussed with the patient and family. After consideration of risks, benefits and other options for treatment, the patient has consented to  Procedure(s) (LRB): COLONOSCOPY (N/A) ESOPHAGOGASTRODUODENOSCOPY (EGD) WITH ESOPHAGEAL DILATION (N/A) as a surgical intervention .  The patient's history has been reviewed, patient examined, no change in status, stable for surgery.  I have reviewed the patient's chart and labs.  Questions were answered to the patient's satisfaction.     Eula Listen

## 2011-11-22 ENCOUNTER — Encounter: Payer: Self-pay | Admitting: Internal Medicine

## 2011-11-22 ENCOUNTER — Encounter: Payer: Self-pay | Admitting: *Deleted

## 2011-11-23 ENCOUNTER — Encounter (HOSPITAL_COMMUNITY): Payer: Self-pay | Admitting: Internal Medicine

## 2011-11-29 NOTE — Progress Notes (Signed)
REVIEWED.  

## 2011-12-09 ENCOUNTER — Encounter (HOSPITAL_COMMUNITY): Payer: Self-pay | Admitting: *Deleted

## 2011-12-09 ENCOUNTER — Emergency Department (HOSPITAL_COMMUNITY)
Admission: EM | Admit: 2011-12-09 | Discharge: 2011-12-09 | Disposition: A | Discharge status: Home / Self Care | Payer: Medicare Other | Attending: Emergency Medicine | Admitting: Emergency Medicine

## 2011-12-09 DIAGNOSIS — M79609 Pain in unspecified limb: Secondary | ICD-10-CM | POA: Insufficient documentation

## 2011-12-09 DIAGNOSIS — M25569 Pain in unspecified knee: Secondary | ICD-10-CM | POA: Insufficient documentation

## 2011-12-09 DIAGNOSIS — F319 Bipolar disorder, unspecified: Secondary | ICD-10-CM | POA: Insufficient documentation

## 2011-12-09 DIAGNOSIS — Z825 Family history of asthma and other chronic lower respiratory diseases: Secondary | ICD-10-CM | POA: Insufficient documentation

## 2011-12-09 DIAGNOSIS — F411 Generalized anxiety disorder: Secondary | ICD-10-CM | POA: Insufficient documentation

## 2011-12-09 DIAGNOSIS — Z800 Family history of malignant neoplasm of digestive organs: Secondary | ICD-10-CM | POA: Insufficient documentation

## 2011-12-09 DIAGNOSIS — Z8249 Family history of ischemic heart disease and other diseases of the circulatory system: Secondary | ICD-10-CM | POA: Insufficient documentation

## 2011-12-09 DIAGNOSIS — G8929 Other chronic pain: Secondary | ICD-10-CM

## 2011-12-09 DIAGNOSIS — Z833 Family history of diabetes mellitus: Secondary | ICD-10-CM | POA: Insufficient documentation

## 2011-12-09 DIAGNOSIS — K219 Gastro-esophageal reflux disease without esophagitis: Secondary | ICD-10-CM | POA: Insufficient documentation

## 2011-12-09 DIAGNOSIS — Z888 Allergy status to other drugs, medicaments and biological substances status: Secondary | ICD-10-CM | POA: Insufficient documentation

## 2011-12-09 DIAGNOSIS — I10 Essential (primary) hypertension: Secondary | ICD-10-CM | POA: Insufficient documentation

## 2011-12-09 DIAGNOSIS — G35 Multiple sclerosis: Secondary | ICD-10-CM | POA: Insufficient documentation

## 2011-12-09 DIAGNOSIS — Z8261 Family history of arthritis: Secondary | ICD-10-CM | POA: Insufficient documentation

## 2011-12-09 DIAGNOSIS — Z87891 Personal history of nicotine dependence: Secondary | ICD-10-CM | POA: Insufficient documentation

## 2011-12-09 DIAGNOSIS — Z8489 Family history of other specified conditions: Secondary | ICD-10-CM | POA: Insufficient documentation

## 2011-12-09 MED ORDER — OXYCODONE-ACETAMINOPHEN 5-325 MG PO TABS
1.0000 | ORAL_TABLET | Freq: Once | ORAL | Status: AC
  Administered 2011-12-09: 1 via ORAL
  Filled 2011-12-09: qty 1

## 2011-12-09 MED ORDER — OXYCODONE-ACETAMINOPHEN 5-325 MG PO TABS: 1.0000 | ORAL_TABLET | Freq: Four times a day (QID) | ORAL | Status: AC | PRN

## 2011-12-09 NOTE — ED Notes (Signed)
Pt states bone spurs to right heel, back and neck which is causing pain. States she also wants right knee checked out because it has been givning out on her.

## 2011-12-09 NOTE — ED Notes (Signed)
Pt states that when she walks she feels like her right knee is "popping out of place".

## 2011-12-09 NOTE — ED Provider Notes (Signed)
History   This chart was scribed for Shelda Jakes, MD by Toya Smothers. The patient was seen in room APA04/APA04. Patient's care was started at 1146.  CSN: [id number]  Arrival date & time 12/09/11  1146   First MD Initiated Contact with Patient 12/09/11 1300      Chief Complaint  Patient presents with  . Extremity Pain   Patient is a 46 y.o. female presenting with extremity pain. The history is provided by the patient. No language interpreter was used.  Extremity Pain Pertinent negatives include no chest pain, no abdominal pain, no headaches and no shortness of breath.   Katelyn Thompson is a 46 y.o. 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 Pt reports that pain is constant, moderate, and aggravated with movement. Pt has been managing foot pain with Cortisone; last injection reported near 2 months ago. R knee pain is described as a "giving out" sensation. Pt denotes associate neck and back pain as a result of lower extremity pain. Denies chest pain, SOB, and gaiting problems.  Pt lists Cr. Nyland as PCP.  Past Medical History  Diagnosis Date  . GERD (gastroesophageal reflux disease)   . COPD (chronic obstructive pulmonary disease)   . HTN (hypertension)   . Nerve damage     Severe to left foot  . Bipolar 1 disorder   . Anxiety and depression     Does not like being around lots of people  . Hypercholesteremia   . MS (multiple sclerosis)     but not offcially disgnosed  . Barrett's esophagus without dysplasia     Past Surgical History  Procedure Date  . S/p hysterectomy   . Tubal ligation   . Sinus surgery with instatrak   . Foot surgery     x 2  . Esophagogastroduodenoscopy 06/2010    diagnosed with Barrett's, small hh, esophagus dilated. Next EGD 06/2011  . Colonoscopy 11/20/2011    Procedure: COLONOSCOPY;  Surgeon: Corbin Ade, MD;  [facility];  Service: Endoscopy;  Laterality: N/A;  10:15    Family History  Problem Relation Age of Onset  . Colon cancer Neg Hx   . Heart disease Mother   . Heart attack Brother 48    deceased  . Diabetes Mother   . Asthma Mother   . Lung disease Mother   . Arthritis Mother   . Allergies Mother     History  Substance Use Topics  . Smoking status: Former Smoker -- 2.0 packs/day for 34 years    Types: Cigarettes    Quit date: 09/17/2011  . Smokeless tobacco: Never Used   Comment: up to 3 ppd  . Alcohol Use: No   Review of Systems  Constitutional: Negative for fever.  HENT: Negative for rhinorrhea.   Eyes: Negative for pain.  Respiratory: Negative for cough and shortness of breath.   Cardiovascular: Negative for chest pain.  Gastrointestinal: Negative for nausea, vomiting, abdominal pain and diarrhea.  Genitourinary: Negative for dysuria.  Musculoskeletal: Positive for arthralgias (R knee pain). Negative for back pain.       R foot Pain  Skin: Negative for rash.  Neurological: Negative for weakness and headaches.  All other systems reviewed and are negative.   Allergies  Aspirin; Nitrofurantoin; and Relafen  Home Medications   Current Outpatient  Rx  Name Route Sig Dispense Refill  . ALBUTEROL SULFATE (2.5 MG/3ML) 0.083% IN NEBU Nebulization Take 2.5 mg by nebulization every 6 (six) hours as needed. For shortness of breath    . ALPRAZOLAM 1 MG PO TABS Oral Take 1 mg by mouth 3 (three) times daily as needed. For anxiety    . AMITRIPTYLINE HCL 25 MG PO TABS Oral Take 25 mg by mouth at bedtime.      Marland Kitchen AMLODIPINE BESYLATE 5 MG PO TABS Oral Take 5 mg by mouth every morning.     Marland Kitchen BLACK COHOSH PO Oral Take 175 mg by mouth 3 (three) times daily.    Marland Kitchen CARBAMAZEPINE 100 MG PO CHEW Oral Chew 100 mg by mouth at bedtime.    Marland Kitchen CARBAMAZEPINE 200 MG PO TABS Oral Take 200 mg by mouth every morning.     . CYCLOBENZAPRINE HCL 10 MG PO TABS  TAKE ONE TABLET  BY MOUTH EVERY 8 HOURS AS NEEDED FOR MUSCLE SPASM 60 tablet 2  . DM-GUAIFENESIN ER 30-600 MG PO TB12 Oral Take 1 tablet by mouth every 12 (twelve) hours.      . ERGOCALCIFEROL 50000 UNITS PO CAPS Oral Take 50,000 Units by mouth once a week. On fridays    . GABAPENTIN 800 MG PO TABS Oral Take 800 mg by mouth 3 (three) times daily.      Marland Kitchen LAMOTRIGINE 100 MG PO TABS Oral Take 100 mg by mouth every morning.     Marland Kitchen LISINOPRIL 10 MG PO TABS Oral Take 10 mg by mouth every morning.     . MOMETASONE FURO-FORMOTEROL FUM 200-5 MCG/ACT IN AERO Inhalation Inhale 2 puffs into the lungs 2 (two) times daily.     Marland Kitchen PANTOPRAZOLE SODIUM 40 MG PO TBEC Oral Take 1 tablet (40 mg total) by mouth 2 (two) times daily. 60 tablet 1  . ROFLUMILAST 500 MCG PO TABS Oral Take 500 mcg by mouth every morning.     . SEROQUEL XR 50 MG PO TB24 Oral Take 50 mg by mouth at bedtime.     . TRAZODONE HCL 150 MG PO TABS Oral Take 300 mg by mouth at bedtime.     . OXYCODONE-ACETAMINOPHEN 5-325 MG PO TABS Oral Take 1-2 tablets by mouth every 6 (six) hours as needed for pain. 15 tablet 0    BP 133/87  Pulse 100  Temp 98.2 F (36.8 C) (Oral)  Ht 5\' 5"  (1.651 m)  Wt 230 lb (104.327 kg)  BMI 38.27 kg/m2  SpO2 98%  Physical Exam  Nursing note and vitals reviewed. Constitutional: She is oriented to person, place, and time. She appears well-developed and well-nourished. No distress.  HENT:  Head: Normocephalic and atraumatic.  Mouth/Throat: No oropharyngeal exudate.  Eyes: EOM are normal. Pupils are equal, round, and reactive to light. Right eye exhibits no discharge. Left eye exhibits no discharge.  Cardiovascular: Normal rate and regular rhythm.   No murmur heard. Pulmonary/Chest: Effort normal and breath sounds normal. No respiratory distress. She has no wheezes. She has no rales.  Abdominal: Soft. Bowel sounds are normal. There is no tenderness.  Musculoskeletal: Normal range of motion.       No swelling in the ankle. No  swelling in the R knee. Patella is in the correct place.   Neurological: She is alert and oriented to person, place, and time. No cranial nerve deficit.  Skin: Skin is warm. No rash noted. No erythema.    ED Course  Procedures (including  critical care time) DIAGNOSTIC STUDIES: Oxygen Saturation is 98% on room air, normal by my interpretation.    COORDINATION OF CARE: 13:01- Evaluated Pt. Pt is awake, alert, and oriented.   Labs Reviewed - No data to display No results found.   1. Chronic knee pain   2. Chronic pain in right foot       MDM  Patient has had pain in her right knee for at least 6 months no specific injury has had x-rays evidence of past but not recently. [facility]. Offered x-rays today the patient did not want those. Orthopedic referral provided on examination of the knee no specific findings. To be stable no effusion no joint line tenderness. Some discomfort with range of motion. Distally neurovascular is intact. 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 Has had cortisone injections in the past usually gets them every 2 months last time was 2 months ago.  Patient's main problem and concern for today is the persistent pain and she currently does not have any pain medicine.    I personally performed the services described in this documentation, which was scribed in my presence. The recorded information has been reviewed and considered.       Shelda Jakes, MD 12/09/11 1324

## 2012-01-01 ENCOUNTER — Other Ambulatory Visit: Payer: Self-pay

## 2012-01-01 MED ORDER — PANTOPRAZOLE SODIUM 40 MG PO TBEC: 40.0000 mg | DELAYED_RELEASE_TABLET | Freq: Two times a day (BID) | ORAL | Status: DC

## 2012-01-12 ENCOUNTER — Encounter (HOSPITAL_COMMUNITY): Payer: Self-pay | Admitting: *Deleted

## 2012-01-12 ENCOUNTER — Emergency Department (HOSPITAL_COMMUNITY)
Admission: EM | Admit: 2012-01-12 | Discharge: 2012-01-12 | Disposition: A | Discharge status: Home / Self Care | Payer: Medicare Other | Attending: Emergency Medicine | Admitting: Emergency Medicine

## 2012-01-12 ENCOUNTER — Emergency Department (HOSPITAL_COMMUNITY): Payer: Medicare Other

## 2012-01-12 DIAGNOSIS — I10 Essential (primary) hypertension: Secondary | ICD-10-CM | POA: Insufficient documentation

## 2012-01-12 DIAGNOSIS — J449 Chronic obstructive pulmonary disease, unspecified: Secondary | ICD-10-CM | POA: Insufficient documentation

## 2012-01-12 DIAGNOSIS — Z87891 Personal history of nicotine dependence: Secondary | ICD-10-CM | POA: Insufficient documentation

## 2012-01-12 DIAGNOSIS — M25469 Effusion, unspecified knee: Secondary | ICD-10-CM | POA: Insufficient documentation

## 2012-01-12 DIAGNOSIS — M1710 Unilateral primary osteoarthritis, unspecified knee: Secondary | ICD-10-CM | POA: Insufficient documentation

## 2012-01-12 DIAGNOSIS — K219 Gastro-esophageal reflux disease without esophagitis: Secondary | ICD-10-CM | POA: Insufficient documentation

## 2012-01-12 DIAGNOSIS — Z79899 Other long term (current) drug therapy: Secondary | ICD-10-CM | POA: Insufficient documentation

## 2012-01-12 DIAGNOSIS — E7800 Pure hypercholesterolemia, unspecified: Secondary | ICD-10-CM | POA: Insufficient documentation

## 2012-01-12 DIAGNOSIS — J4489 Other specified chronic obstructive pulmonary disease: Secondary | ICD-10-CM | POA: Insufficient documentation

## 2012-01-12 DIAGNOSIS — M25461 Effusion, right knee: Secondary | ICD-10-CM

## 2012-01-12 HISTORY — DX: Unspecified osteoarthritis, unspecified site: M19.90

## 2012-01-12 MED ORDER — DEXAMETHASONE 6 MG PO TABS: ORAL_TABLET | ORAL | Status: DC

## 2012-01-12 MED ORDER — HYDROCODONE-ACETAMINOPHEN 7.5-325 MG PO TABS: 1.0000 | ORAL_TABLET | ORAL | Status: DC | PRN

## 2012-01-12 NOTE — ED Notes (Signed)
H. Bryant, PA at bedside. 

## 2012-01-12 NOTE — ED Provider Notes (Signed)
Medical screening examination/treatment/procedure(s) were performed by non-physician practitioner and as supervising physician I was immediately available for consultation/collaboration.  Wyndell Cardiff L Jadynn Epping, MD 01/12/12 1636 

## 2012-01-12 NOTE — ED Notes (Signed)
Pain rt knee No known injury

## 2012-01-12 NOTE — ED Provider Notes (Signed)
History     CSN: [id number]  Arrival date & time 01/12/12  1049   First MD Initiated Contact with Patient 01/12/12 1314      Chief Complaint  Patient presents with  . Knee Pain    (Consider location/radiation/quality/duration/timing/severity/associated sxs/prior treatment) HPI Comments: Hx of djd with bone spurs . Pt states the right knee is swelling and keeping her up at night. She has not talked with her primary MD about this. She was seen on 8/31 in ED for similar pain of the foot and right knee.  Patient is a 46 y.o. female presenting with knee pain. The history is provided by the patient.  Knee Pain This is a chronic problem. The current episode started more than 1 month ago. The problem occurs daily. The problem has been gradually worsening. Associated symptoms include arthralgias and joint swelling. Pertinent negatives include no abdominal pain, chest pain, coughing or neck pain. The symptoms are aggravated by standing and walking. She has tried acetaminophen for the symptoms. The treatment provided no relief.    Past Medical History  Diagnosis Date  . GERD (gastroesophageal reflux disease)   . COPD (chronic obstructive pulmonary disease)   . HTN (hypertension)   . Nerve damage     Severe to left foot  . Bipolar 1 disorder   . Anxiety and depression     Does not like being around lots of people  . Hypercholesteremia   . MS (multiple sclerosis)     but not offcially disgnosed  . Barrett's esophagus without dysplasia   . Arthritis     Past Surgical History  Procedure Date  . S/p hysterectomy   . Tubal ligation   . Sinus surgery with instatrak   . Foot surgery     x 2  . Esophagogastroduodenoscopy 06/2010    diagnosed with Barrett's, small hh, esophagus dilated. Next EGD 06/2011  . Colonoscopy 11/20/2011    Procedure: COLONOSCOPY;  Surgeon: Corbin Ade, MD;  Location: [facility];  Service: Endoscopy;  Laterality: N/A;  10:15    Family History  Problem  Relation Age of Onset  . Colon cancer Neg Hx   . Heart disease Mother   . Heart attack Brother 48    deceased  . Diabetes Mother   . Asthma Mother   . Lung disease Mother   . Arthritis Mother   . Allergies Mother     History  Substance Use Topics  . Smoking status: Former Smoker -- 2.0 packs/day for 34 years    Types: Cigarettes    Quit date: 09/17/2011  . Smokeless tobacco: Never Used   Comment: up to 3 ppd  . Alcohol Use: Yes     rarely    OB History    Grav Para Term Preterm Abortions TAB SAB Ect Mult Living                  Review of Systems  Constitutional: Negative for activity change.       All ROS Neg except as noted in HPI  HENT: Negative for nosebleeds and neck pain.   Eyes: Negative for photophobia and discharge.  Respiratory: Negative for cough, shortness of breath and wheezing.   Cardiovascular: Negative for chest pain and palpitations.  Gastrointestinal: Negative for abdominal pain and blood in stool.  Genitourinary: Negative for dysuria, frequency and hematuria.  Musculoskeletal: Positive for joint swelling and arthralgias. Negative for back pain.  Skin: Negative.   Neurological: Negative for dizziness, seizures  and speech difficulty.  Psychiatric/Behavioral: Negative for hallucinations and confusion. The patient is nervous/anxious.     Allergies  Aspirin; Nitrofurantoin; and Relafen  Home Medications   Current Outpatient Rx  Name Route Sig Dispense Refill  . ACETAMINOPHEN 500 MG PO TABS Oral Take 1,000 mg by mouth every 6 (six) hours as needed. Pain    . ADVANCED FIBER COMPLEX PO Oral Take 2 capsules by mouth daily.    . ALBUTEROL SULFATE (2.5 MG/3ML) 0.083% IN NEBU Nebulization Take 2.5 mg by nebulization every 6 (six) hours as needed. For shortness of breath    . ALPRAZOLAM 1 MG PO TABS Oral Take 1 mg by mouth 3 (three) times daily as needed. For anxiety    . AMITRIPTYLINE HCL 25 MG PO TABS Oral Take 25 mg by mouth at bedtime.      Marland Kitchen  AMLODIPINE BESYLATE 5 MG PO TABS Oral Take 5 mg by mouth every morning.     Marland Kitchen BLACK COHOSH PO Oral Take 175 mg by mouth 3 (three) times daily.    Marland Kitchen CARBAMAZEPINE 100 MG PO CHEW Oral Chew 100 mg by mouth daily.     . CYCLOBENZAPRINE HCL 10 MG PO TABS Oral Take 10 mg by mouth 3 (three) times daily as needed. Muscle Spasms    . DM-GUAIFENESIN ER 30-600 MG PO TB12 Oral Take 1 tablet by mouth every 12 (twelve) hours.      . ERGOCALCIFEROL 50000 UNITS PO CAPS Oral Take 50,000 Units by mouth once a week. On fridays    . GABAPENTIN 800 MG PO TABS Oral Take 800 mg by mouth 3 (three) times daily.      Marland Kitchen LAMOTRIGINE 100 MG PO TABS Oral Take 100 mg by mouth every morning.     Marland Kitchen LISINOPRIL 10 MG PO TABS Oral Take 10 mg by mouth every morning.     . MOMETASONE FURO-FORMOTEROL FUM 200-5 MCG/ACT IN AERO Inhalation Inhale 2 puffs into the lungs 2 (two) times daily.     Marland Kitchen PANTOPRAZOLE SODIUM 40 MG PO TBEC Oral Take 1 tablet (40 mg total) by mouth 2 (two) times daily. 60 tablet 3  . ROFLUMILAST 500 MCG PO TABS Oral Take 500 mcg by mouth every morning.     . SEROQUEL XR 50 MG PO TB24 Oral Take 50 mg by mouth at bedtime.     . TRAZODONE HCL 150 MG PO TABS Oral Take 300 mg by mouth at bedtime.     Marland Kitchen DEXAMETHASONE 6 MG PO TABS  2 tabs day one, then 1 po daily with food 7 tablet 0  . HYDROCODONE-ACETAMINOPHEN 7.5-325 MG PO TABS Oral Take 1 tablet by mouth every 4 (four) hours as needed for pain. 15 tablet 0    BP 122/85  Pulse 101  Temp 98.4 F (36.9 C) (Oral)  Resp 16  Ht 5' 4.5" (1.638 m)  Wt 200 lb (90.719 kg)  BMI 33.80 kg/m2  SpO2 95%  Physical Exam  Nursing note and vitals reviewed. Constitutional: She is oriented to person, place, and time. She appears well-developed and well-nourished.  Non-toxic appearance.  HENT:  Head: Normocephalic.  Right Ear: Tympanic membrane and external ear normal.  Left Ear: Tympanic membrane and external ear normal.  Eyes: EOM and lids are normal. Pupils are equal,  round, and reactive to light.  Neck: Normal range of motion. Neck supple. Carotid bruit is not present.  Cardiovascular: Normal rate, regular rhythm, normal heart sounds, intact distal pulses and normal pulses.  Pulmonary/Chest: Breath sounds normal. No respiratory distress.  Abdominal: Soft. Bowel sounds are normal. There is no tenderness. There is no guarding.  Musculoskeletal: Normal range of motion.       Mod effusion of the right knee. The knee is warm but not hot. FROM of the right ankle  Lymphadenopathy:       Head (right side): No submandibular adenopathy present.       Head (left side): No submandibular adenopathy present.    She has no cervical adenopathy.  Neurological: She is alert and oriented to person, place, and time. She has normal strength. No cranial nerve deficit or sensory deficit.  Skin: Skin is warm and dry.  Psychiatric: She has a normal mood and affect. Her speech is normal.    ED Course  Procedures (including critical care time)  Labs Reviewed - No data to display Dg Knee Complete 4 Views Right  01/12/2012  *RADIOLOGY REPORT*  Clinical Data: Knee pain  RIGHT KNEE - COMPLETE 4+ VIEW  Comparison: None.  Findings: No evidence of fracture or dislocation of the right knee. There is a subpatellar joint effusion.  IMPRESSION:  1.  No evidence of fracture. 2.  Suprapatellar joint effusion.   Original Report Authenticated By: Genevive Bi, M.D.      1. Knee effusion, right       MDM  I have reviewed nursing notes, vital signs, and all appropriate lab and imaging results for this patient. Patient has increasing pain of the right knee. She is also noted some swelling present. She denies any recent injury or trauma. Review of the patient's previous x-rays and hospital visits revealed degenerative joint disease changes of several areas with bone spurs. The x-ray of the right knee today reveals suprapatellar joint effusion. There is no fracture or dislocation. The  patient is treated with dexamethasone one daily. Norco 7.5 mg #15 tablets. Discussed with patient need for her to see her primary physician and or orthopedic physician.       Kathie Dike, PA 01/12/12 1347  Kathie Dike, PA 01/12/12 1349

## 2012-01-16 ENCOUNTER — Telehealth: Payer: Self-pay

## 2012-01-16 MED ORDER — DEXLANSOPRAZOLE 60 MG PO CPDR: 60.0000 mg | DELAYED_RELEASE_CAPSULE | Freq: Every day | ORAL | Status: DC

## 2012-01-16 NOTE — Telephone Encounter (Signed)
Pt called this morning because the Protonix 40 mg BID is not working. She is burning really bad. She was given so Dexilant samples before and they worked good but she does not know if her insurance will cover it. Please advise

## 2012-01-16 NOTE — Telephone Encounter (Signed)
Pt aware to come by and pick up samples. Rx has been called in to drug store also.

## 2012-01-16 NOTE — Telephone Encounter (Signed)
She may pick up Dexilant samples.  I'm not sure what else she has tried. I will send prescription for Dexilant.  May need PA.

## 2012-01-31 ENCOUNTER — Telehealth: Payer: Self-pay | Admitting: *Deleted

## 2012-01-31 ENCOUNTER — Encounter: Payer: Self-pay | Admitting: Orthopedic Surgery

## 2012-01-31 ENCOUNTER — Ambulatory Visit (INDEPENDENT_AMBULATORY_CARE_PROVIDER_SITE_OTHER): Payer: Medicare Other | Admitting: Orthopedic Surgery

## 2012-01-31 VITALS — BP 130/84 | Ht 64.5 in | Wt 200.0 lb

## 2012-01-31 DIAGNOSIS — M1710 Unilateral primary osteoarthritis, unspecified knee: Secondary | ICD-10-CM

## 2012-01-31 DIAGNOSIS — M179 Osteoarthritis of knee, unspecified: Secondary | ICD-10-CM

## 2012-01-31 DIAGNOSIS — M25469 Effusion, unspecified knee: Secondary | ICD-10-CM

## 2012-01-31 DIAGNOSIS — M23329 Other meniscus derangements, posterior horn of medial meniscus, unspecified knee: Secondary | ICD-10-CM

## 2012-01-31 HISTORY — DX: Osteoarthritis of knee, unspecified: M17.9

## 2012-01-31 HISTORY — DX: Unilateral primary osteoarthritis, unspecified knee: M17.10

## 2012-01-31 MED ORDER — HYDROCODONE-ACETAMINOPHEN 7.5-325 MG PO TABS: 1.0000 | ORAL_TABLET | ORAL | Status: DC | PRN

## 2012-01-31 MED ORDER — CYCLOBENZAPRINE HCL 10 MG PO TABS: 10.0000 mg | ORAL_TABLET | Freq: Three times a day (TID) | ORAL | Status: DC | PRN

## 2012-01-31 NOTE — Telephone Encounter (Signed)
#  3 boxes of Dexilant at the front desk for pt to pick up.

## 2012-01-31 NOTE — Progress Notes (Signed)
Patient ID: Katelyn Thompson, female   DOB: 1966/01/08, 46 y.o.   MRN: [id number] Chief Complaint  Patient presents with  . Knee Pain    right knee pain and swelling x 1 month, gradual onset, no known injury    The patient presents with 4 week history of sharp throbbing stabbing knee pain which came on gradually no associated trauma complained of 8/10 constant pain unrelieved by hydrocodone and dexamethasone 6 mg oral. She complains of mechanical symptoms in the knee including locking and catching associated with large joint effusion which was confirmed on x-ray. No acute of fracture was seen at that time. She was placed in a knee immobilizer and started on the pain medications as described  She has an involved range of symptoms under the review of systems heading unexpected weight loss blurred vision chills eye pain shortness of breath wheezing heartburn constipation numbness tingling nervousness anxiety depression excessive thirst and urination  She has social issues going on at this time including recent report that her husband would need surgery because of cancer  Past Medical History  Diagnosis Date  . GERD (gastroesophageal reflux disease)   . COPD (chronic obstructive pulmonary disease)   . HTN (hypertension)   . Nerve damage     Severe to left foot  . Bipolar 1 disorder   . Anxiety and depression     Does not like being around lots of people  . Hypercholesteremia   . MS (multiple sclerosis)     but not offcially disgnosed  . Barrett's esophagus without dysplasia   . Arthritis     Past Surgical History  Procedure Date  . S/p hysterectomy   . Tubal ligation   . Sinus surgery with instatrak   . Foot surgery     x 2  . Esophagogastroduodenoscopy 06/2010    diagnosed with Barrett's, small hh, esophagus dilated. Next EGD 06/2011  . Colonoscopy 11/20/2011    Procedure: COLONOSCOPY;  Surgeon: Corbin Ade, MD;  Location: [facility];  Service: Endoscopy;  Laterality: N/A;  10:15      BP 130/84  Ht 5' 4.5" (1.638 m)  Wt 200 lb (90.719 kg)  BMI 33.80 kg/m2  The patient is well-developed and well-nourished grooming and hygiene are normal she is oriented x3 her mood and affect are normal  She comes in with an altered gait requiring a brace and support  Her right knee is tender and swollen there is a large joint effusion she has medial joint line tenderness severe mild lateral joint line tenderness range of motion limited to 60 and range of motion is painful throughout the ARC. The knee feels stable strength is normal skin is intact distal neurovascular function is normal there is no lymphadenopathy and normal sensation is noted.  Left knee inspection was normal full range of motion was noted. Ligaments were stable muscle tone and strength were normal muscle grade 5. Skin intact distal neurovascular function normal  X-rays again show effusion mild degenerative change  Impression effusion  Meniscal tear  Recommend MRI, economy hinged brace wrap. We will correspond by phone because of her husband situation. She's already been advised that she may need surgery if her meniscus is indeed torn.

## 2012-01-31 NOTE — Telephone Encounter (Signed)
Katelyn Thompson called today she is out of Dexilant and would like to get more free samples. Please follow up. Thanks.

## 2012-01-31 NOTE — Patient Instructions (Addendum)
You have been scheduled for an MRI scan.  Your insurance company requires a precertification prior to scheduling the MRI.  If the MRI scan is not approved we will let you know and make further treatment recommendations according to your insurance's guidelines.  We will call you with the results     

## 2012-02-12 ENCOUNTER — Telehealth: Payer: Self-pay | Admitting: Radiology

## 2012-02-12 NOTE — Telephone Encounter (Signed)
Patient has an MRI at [facility] on 02-17-12 at 5:45. Patient has [facility], authorization # [id number] and it expires on 03-21-12. Dr. Romeo Apple will call the patient with her results.

## 2012-02-13 ENCOUNTER — Telehealth: Payer: Self-pay | Admitting: Internal Medicine

## 2012-02-13 NOTE — Telephone Encounter (Signed)
Patient is requesting more samples of Dexilant she is about out, please advise?

## 2012-02-14 NOTE — Telephone Encounter (Signed)
Noted  

## 2012-02-14 NOTE — Telephone Encounter (Signed)
#  3 boxes of dexilant at the front desk for pt to pick up. Pt is aware.

## 2012-02-17 ENCOUNTER — Other Ambulatory Visit: Payer: Medicare Other

## 2012-02-17 ENCOUNTER — Ambulatory Visit
Admission: RE | Admit: 2012-02-17 | Discharge: 2012-02-17 | Disposition: A | Discharge status: Home / Self Care | Payer: Medicare Other | Source: Ambulatory Visit | Attending: Orthopedic Surgery | Admitting: Orthopedic Surgery

## 2012-02-17 DIAGNOSIS — M23329 Other meniscus derangements, posterior horn of medial meniscus, unspecified knee: Secondary | ICD-10-CM

## 2012-02-17 DIAGNOSIS — M1710 Unilateral primary osteoarthritis, unspecified knee: Secondary | ICD-10-CM

## 2012-02-17 DIAGNOSIS — M25469 Effusion, unspecified knee: Secondary | ICD-10-CM

## 2012-02-19 ENCOUNTER — Encounter: Payer: Self-pay | Admitting: Internal Medicine

## 2012-02-20 ENCOUNTER — Ambulatory Visit: Payer: Medicare Other | Admitting: Gastroenterology

## 2012-02-20 ENCOUNTER — Telehealth: Payer: Self-pay | Admitting: Orthopedic Surgery

## 2012-02-20 ENCOUNTER — Telehealth: Payer: Self-pay | Admitting: *Deleted

## 2012-02-20 ENCOUNTER — Other Ambulatory Visit: Payer: Self-pay | Admitting: Orthopedic Surgery

## 2012-02-20 DIAGNOSIS — I10 Essential (primary) hypertension: Secondary | ICD-10-CM

## 2012-02-20 NOTE — Telephone Encounter (Signed)
Patient called to discuss arthroscopy results, arthroscopy shows loose body and arthritis  Patient advised to follow arthroscopy removal loose body  Future knee replacement will be required  Expect 2-3 days up to a week on crutches or walker  Patient agrees to surgery risks and benefits explained.

## 2012-02-20 NOTE — Telephone Encounter (Signed)
I called the patient and gave her her pre op date of Thursday, 02/22/12 at 9:00 at [facility], Her surgery will be Monday 02/26/12 10:30 Per Selena Batten Faint's phone call this morning.

## 2012-02-20 NOTE — Telephone Encounter (Signed)
Contacted insurer, [employer],  [place] [phone], regarding out-patient surgery scheduled for 02/23/12 at [facility], CPT [phone], [phone].  Per Albin Fischer, no pre-authorization is required.  Her name and today's date for reference.

## 2012-02-20 NOTE — Telephone Encounter (Signed)
Pt was a no show

## 2012-02-22 ENCOUNTER — Other Ambulatory Visit: Payer: Self-pay

## 2012-02-22 ENCOUNTER — Encounter (HOSPITAL_COMMUNITY)
Admission: RE | Admit: 2012-02-22 | Discharge: 2012-02-22 | Disposition: A | Discharge status: Home / Self Care | Payer: Medicare Other | Source: Ambulatory Visit | Attending: Orthopedic Surgery | Admitting: Orthopedic Surgery

## 2012-02-22 ENCOUNTER — Encounter (HOSPITAL_COMMUNITY): Payer: Self-pay | Admitting: Pharmacy Technician

## 2012-02-22 ENCOUNTER — Encounter (HOSPITAL_COMMUNITY): Payer: Self-pay

## 2012-02-22 HISTORY — DX: Major depressive disorder, single episode, unspecified: F32.9

## 2012-02-22 HISTORY — DX: Depression, unspecified: F32.A

## 2012-02-22 HISTORY — DX: Anxiety disorder, unspecified: F41.9

## 2012-02-22 HISTORY — DX: Other specified postprocedural states: Z98.890

## 2012-02-22 HISTORY — DX: Other specified postprocedural states: R11.2

## 2012-02-22 LAB — BASIC METABOLIC PANEL
BUN: 6 mg/dL (ref 6–23)
Calcium: 10.2 mg/dL (ref 8.4–10.5)
Chloride: 99 mEq/L (ref 96–112)
Creatinine, Ser: 0.63 mg/dL (ref 0.50–1.10)
GFR calc Af Amer: >90 mL/min (ref >90)
GFR calc non Af Amer: >90 mL/min (ref >90)

## 2012-02-22 LAB — SURGICAL PCR SCREEN: MRSA, PCR: NEGATIVE

## 2012-02-22 LAB — HEMOGLOBIN AND HEMATOCRIT, BLOOD: Hemoglobin: 14.9 g/dL (ref 12.0–15.0)

## 2012-02-22 NOTE — Progress Notes (Signed)
02/22/12 0925  OBSTRUCTIVE SLEEP APNEA  Have you ever been diagnosed with sleep apnea through a sleep study? No  Do you snore loudly (loud enough to be heard through closed doors)?  1  Do you often feel tired, fatigued, or sleepy during the daytime? 1  Has anyone observed you stop breathing during your sleep? 0  Do you have, or are you being treated for high blood pressure? 1  BMI more than 35 kg/m2? 1  Age over 46 years old? 0  Neck circumference greater than 40 cm/18 inches? 0  Gender: 0  Obstructive Sleep Apnea Score 4   Score 4 or greater  Results sent to PCP

## 2012-02-22 NOTE — Patient Instructions (Addendum)
20 Katelyn Thompson  02/22/2012   Your procedure is scheduled on:   02/26/2012  Report to [facility] at  900  AM.  Call this number if you have problems the morning of surgery: [phone]   Remember:   Do not eat food:After Midnight.  May have clear liquids:until Midnight .    Take these medicines the morning of surgery with A SIP OF WATER: xanax,norco,elavil,dexilant,neurontin,lisinopril,protonix,decadron   Do not wear jewelry, make-up or nail polish.  Do not wear lotions, powders, or perfumes.   Do not shave 48 hours prior to surgery. Men may shave face and neck.  Do not bring valuables to the hospital.  Contacts, dentures or bridgework may not be worn into surgery.  Leave suitcase in the car. After surgery it may be brought to your room.  For patients admitted to the hospital, checkout time is 11:00 AM the day of discharge.   Patients discharged the day of surgery will not be allowed to drive home.  Name and phone number of your driver: family  Special Instructions: Shower using CHG 2 nights before surgery and the night before surgery.  If you shower the day of surgery use CHG.  Use special wash - you have one bottle of CHG for all showers.  You should use approximately 1/3 of the bottle for each shower.   Please read over the following fact sheets that you were given: Pain Booklet, Coughing and Deep Breathing, MRSA Information, Surgical Site Infection Prevention, Anesthesia Post-op Instructions and Care and Recovery After Surgery  Arthroscopy Arthroscopy is a procedure in which a caregiver uses an arthroscope. An arthroscope is an instrument that allows your caregiver to look directly into a joint. It is like a small telescope attached to a video camera, and is similar in size to a pencil. Arthroscopes let your caregiver see inside your joint on an attached television monitor. Most joints in the human body can be examined and surgery can be performed through the arthroscope using  small incisions. Prior to the use of arthroscopes, surgeries were done with larger open incisions, which requires longer recovery times. On occasion, arthroscopic procedures result in complications such as bleeding, swelling and pain. If a complication results, a longer recovery and rehabilitation may be required. INDICATIONS Arthroscopic procedures were developed to remove, repair, or replace (reconstruct) damaged tissue. Arthroscopy can be preformed if the procedure involves trimming tissue, removing fragments of cartilage or bone (loose bodies) within joints, suctioning debris, biopsy of tissue, smoothing rough surfaces, removing inflamed tissue, shrinking tissue, or sewing (suturing), tacking, or stapling cartilage and ligaments. What can be done is dependent on many factors. Arthroscopy allows for surgeons to perform certain surgical procedures. Most of the surgeries you can go home the same day as the procedure (outpatient procedures) because the procedure does not cause as much trauma to the patient. Arthroscopy is a valuable diagnostic tool. Radiographs (such as x-ray and CT scans) have poor ability at showing soft tissue, whereas arthroscopy gives the caregiver direct visualization of soft tissue, cartilege, and bone. However, the emergence of magnetic resonance imaging (MRI) has lessened the need for arthroscopy as a diagnostic tool.  TECHNIQUE  Repair and reconstruction arthroscopic techniques may require additional and/or larger incisions than diagnostic arthroscopy portals (1/4 inch incisions). The procedures are often more extensive in repair and reconstruction, than excision procedures. Therefore, the patient may need to stay in the hospital overnight after arthroscopic repair or reconstruction. These procedures also disrupt more tissue, and  discomfort may occur, so the temporary use of braces, casts, or crutches, as well as rehabilitation, may be needed.  In order to undergo an arthroscopic  procedure, a complete evaluation is necessary in order to provide the caregiver with as accurate of a diagnosis as possible. Sometimes it is necessary to perform diagnostic arthroscopy before another surgery can be scheduled.  Both diagnostic and surgical arthroscopy can be performed under local anesthesia (only the joint is numbed), regional anesthesia (the operative limb is numbed), spinal or epidural anesthesia (only the lower extremities are numbed), or general anesthesia (you are completely asleep). The type of anesthetic is dependent on the patient, the surgeon, and the procedure being performed.  If you ask prior to the operation, you may be able to obtain pictures or a video from the arthroscopic camera.  Do not eat or drink anything for at least 8 hours before surgery. Food and drinks (including coffee) make general anesthesia more hazardous. SEEK MEDICAL CARE IF:  You experience pain, numbness, or coldness in the extremity operated on.  Blue, gray, or dark color appears in the fingers or toenails.  You have increased pain, swelling, redness, drainage, or bleeding in the surgical area despite rest, ice, elevation, and pain medications.  You have signs of infection, including a fever 102 F (38.9 C) or higher. Document Released: 10/26/2004 Document Revised: 06/19/2011 Document Reviewed: 07/09/2008 [facility] Patient Information 2013 [place], [place]. PATIENT INSTRUCTIONS POST-ANESTHESIA  IMMEDIATELY FOLLOWING SURGERY:  Do not drive or operate machinery for the first twenty four hours after surgery.  Do not make any important decisions for twenty four hours after surgery or while taking narcotic pain medications or sedatives.  If you develop intractable nausea and vomiting or a severe headache please notify your doctor immediately.  FOLLOW-UP:  Please make an appointment with your surgeon as instructed. You do not need to follow up with anesthesia unless specifically instructed to do  so.  WOUND CARE INSTRUCTIONS (if applicable):  Keep a dry clean dressing on the anesthesia/puncture wound site if there is drainage.  Once the wound has quit draining you may leave it open to air.  Generally you should leave the bandage intact for twenty four hours unless there is drainage.  If the epidural site drains for more than 36-48 hours please call the anesthesia department.  QUESTIONS?:  Please feel free to call your physician or the hospital operator if you have any questions, and they will be happy to assist you.

## 2012-02-22 NOTE — H&P (Signed)
Chief Complaint   Patient presents with   .  Knee Pain     right knee pain and swelling x 1 month, gradual onset, no known injury   The patient presents with 4 week history of sharp throbbing stabbing knee pain which came on gradually no associated trauma complained of 8/10 constant pain unrelieved by hydrocodone and dexamethasone 6 mg oral. She complains of mechanical symptoms in the knee including locking and catching associated with large joint effusion which was confirmed on x-ray. No acute of fracture was seen at that time. She was placed in a knee immobilizer and started on the pain medications as described  She has an involved range of symptoms under the review of systems heading unexpected weight loss blurred vision chills eye pain shortness of breath wheezing heartburn constipation numbness tingling nervousness anxiety depression excessive thirst and urination  She has social issues going on at this time including recent report that her husband would need surgery because of cancer  Past Medical History   Diagnosis  Date   .  GERD (gastroesophageal reflux disease)    .  COPD (chronic obstructive pulmonary disease)    .  HTN (hypertension)    .  Nerve damage      Severe to left foot   .  Bipolar 1 disorder    .  Anxiety and depression      Does not like being around lots of people   .  Hypercholesteremia    .  MS (multiple sclerosis)      but not offcially disgnosed   .  Barrett's esophagus without dysplasia    .  Arthritis     Past Surgical History   Procedure  Date   .  S/p hysterectomy    .  Tubal ligation    .  Sinus surgery with instatrak    .  Foot surgery      x 2   .  Esophagogastroduodenoscopy  06/2010     diagnosed with Barrett's, small hh, esophagus dilated. Next EGD 06/2011   .  Colonoscopy  11/20/2011     Procedure: COLONOSCOPY; Surgeon: Corbin Ade, MD; Location: [facility]; Service: Endoscopy; Laterality: N/A; 10:15     BP 130/84  Ht 5' 4.5" (1.638 m)   Wt 200 lb (90.719 kg)  BMI 33.80 kg/m2   The patient is well-developed and well-nourished grooming and hygiene are normal she is oriented x3 her mood and affect are normal   She comes in with an altered gait requiring a brace and support   Upper extremity exam  Inspection and palpation revealed no abnormalities in the upper extremities.  Range of motion is full without contracture.  Motor exam is normal with grade 5 strength.  The joints are fully reduced without subluxation.  There is no atrophy or tremor and muscle tone is normal.  All joints are stable.  Her right knee is tender and swollen there is a large joint effusion she has medial joint line tenderness severe mild lateral joint line tenderness range of motion limited to 60 and range of motion is painful throughout the ARC The knee feels stable strength is normal skin is intact distal neurovascular function is normal there is no lymphadenopathy and normal sensation is noted.   Left knee inspection was normal full range of motion was noted. Ligaments were stable muscle tone and strength were normal muscle grade 5. Skin intact distal neurovascular function normal   X-rays again show effusion  mild degenerative change  MRI IMPRESSION:  1. Focal chondral defect in the lateral femoral condyle, with  underlying mild degenerative chondral thinning. There is also a  suspected chondral fragment posteriorly in the knee joint, although  this fragment seems larger than the defect in the lateral femoral  condyle.  2. Mild chondral irregularity/surface fissuring in the  patellofemoral joint. Mild degenerative chondral thinning in the  medial compartment.  3. Moderate knee effusion.  4. Minimal pes anserine bursitis.   Impression chondral loose body, osteoarthritis, knee effusion  Recommend arthroscopy right knee, removal loose body

## 2012-02-26 ENCOUNTER — Encounter (HOSPITAL_COMMUNITY)
Admission: RE | Disposition: A | Discharge status: Home / Self Care | Payer: Self-pay | Source: Ambulatory Visit | Attending: Orthopedic Surgery

## 2012-02-26 ENCOUNTER — Encounter (HOSPITAL_COMMUNITY): Payer: Self-pay | Admitting: Anesthesiology

## 2012-02-26 ENCOUNTER — Ambulatory Visit (HOSPITAL_COMMUNITY)
Admission: RE | Admit: 2012-02-26 | Discharge: 2012-02-26 | Disposition: A | Discharge status: Home / Self Care | Payer: Medicare Other | Source: Ambulatory Visit | Attending: Orthopedic Surgery | Admitting: Orthopedic Surgery

## 2012-02-26 ENCOUNTER — Ambulatory Visit (HOSPITAL_COMMUNITY): Payer: Medicare Other | Admitting: Anesthesiology

## 2012-02-26 ENCOUNTER — Encounter (HOSPITAL_COMMUNITY): Payer: Self-pay | Admitting: *Deleted

## 2012-02-26 DIAGNOSIS — J4489 Other specified chronic obstructive pulmonary disease: Secondary | ICD-10-CM | POA: Insufficient documentation

## 2012-02-26 DIAGNOSIS — I10 Essential (primary) hypertension: Secondary | ICD-10-CM | POA: Insufficient documentation

## 2012-02-26 DIAGNOSIS — M1710 Unilateral primary osteoarthritis, unspecified knee: Secondary | ICD-10-CM | POA: Insufficient documentation

## 2012-02-26 DIAGNOSIS — Z01812 Encounter for preprocedural laboratory examination: Secondary | ICD-10-CM | POA: Insufficient documentation

## 2012-02-26 DIAGNOSIS — J449 Chronic obstructive pulmonary disease, unspecified: Secondary | ICD-10-CM | POA: Insufficient documentation

## 2012-02-26 DIAGNOSIS — Z01810 Encounter for preprocedural cardiovascular examination: Secondary | ICD-10-CM | POA: Insufficient documentation

## 2012-02-26 HISTORY — PX: ANKLE ARTHROSCOPY WITH DRILLING/MICROFRACTURE: SHX5580

## 2012-02-26 HISTORY — PX: KNEE ARTHROSCOPY: SHX127

## 2012-02-26 SURGERY — ARTHROSCOPY, ANKLE, WITH MICROFRACTURE
Anesthesia: General | Site: Knee | Laterality: Right | Wound class: Clean

## 2012-02-26 MED ORDER — PROPOFOL 10 MG/ML IV EMUL
INTRAVENOUS | Status: AC
  Filled 2012-02-26: qty 20

## 2012-02-26 MED ORDER — LIDOCAINE HCL (PF) 1 % IJ SOLN
INTRAMUSCULAR | Status: AC
  Filled 2012-02-26: qty 5

## 2012-02-26 MED ORDER — MIDAZOLAM HCL 2 MG/2ML IJ SOLN
INTRAMUSCULAR | Status: AC
  Filled 2012-02-26: qty 2

## 2012-02-26 MED ORDER — MIDAZOLAM HCL 2 MG/2ML IJ SOLN
1.0000 mg | INTRAMUSCULAR | Status: DC | PRN
  Administered 2012-02-26 (×2): 2 mg via INTRAVENOUS

## 2012-02-26 MED ORDER — KETOROLAC TROMETHAMINE 30 MG/ML IJ SOLN
INTRAMUSCULAR | Status: AC
  Filled 2012-02-26: qty 1

## 2012-02-26 MED ORDER — DEXAMETHASONE SODIUM PHOSPHATE 4 MG/ML IJ SOLN
INTRAMUSCULAR | Status: AC
  Filled 2012-02-26: qty 1

## 2012-02-26 MED ORDER — FENTANYL CITRATE 0.05 MG/ML IJ SOLN
INTRAMUSCULAR | Status: DC | PRN
  Administered 2012-02-26 (×2): 50 ug via INTRAVENOUS

## 2012-02-26 MED ORDER — HYDROCODONE-ACETAMINOPHEN 5-325 MG PO TABS
2.0000 | ORAL_TABLET | Freq: Once | ORAL | Status: AC
  Administered 2012-02-26: 2 via ORAL

## 2012-02-26 MED ORDER — FENTANYL CITRATE 0.05 MG/ML IJ SOLN
INTRAMUSCULAR | Status: AC
  Filled 2012-02-26: qty 2

## 2012-02-26 MED ORDER — FENTANYL CITRATE 0.05 MG/ML IJ SOLN
25.0000 ug | INTRAMUSCULAR | Status: DC | PRN
  Administered 2012-02-26 (×4): 50 ug via INTRAVENOUS

## 2012-02-26 MED ORDER — ONDANSETRON HCL 4 MG/2ML IJ SOLN: 4.0000 mg | Freq: Once | INTRAMUSCULAR | Status: DC | PRN

## 2012-02-26 MED ORDER — GLYCOPYRROLATE 0.2 MG/ML IJ SOLN
INTRAMUSCULAR | Status: AC
  Filled 2012-02-26: qty 3

## 2012-02-26 MED ORDER — ONDANSETRON HCL 4 MG/2ML IJ SOLN
4.0000 mg | Freq: Once | INTRAMUSCULAR | Status: AC
  Administered 2012-02-26: 4 mg via INTRAVENOUS

## 2012-02-26 MED ORDER — KETOROLAC TROMETHAMINE 30 MG/ML IJ SOLN
30.0000 mg | Freq: Once | INTRAMUSCULAR | Status: AC
  Administered 2012-02-26: 30 mg via INTRAVENOUS

## 2012-02-26 MED ORDER — SUCCINYLCHOLINE CHLORIDE 20 MG/ML IJ SOLN
INTRAMUSCULAR | Status: DC | PRN
  Administered 2012-02-26: 120 mg via INTRAVENOUS

## 2012-02-26 MED ORDER — HYDROCODONE-ACETAMINOPHEN 5-325 MG PO TABS
ORAL_TABLET | ORAL | Status: AC
  Filled 2012-02-26: qty 1

## 2012-02-26 MED ORDER — EPINEPHRINE HCL 1 MG/ML IJ SOLN
INTRAMUSCULAR | Status: AC
  Filled 2012-02-26: qty 5

## 2012-02-26 MED ORDER — NEOSTIGMINE METHYLSULFATE 1 MG/ML IJ SOLN
INTRAMUSCULAR | Status: AC
  Filled 2012-02-26: qty 10

## 2012-02-26 MED ORDER — GLYCOPYRROLATE 0.2 MG/ML IJ SOLN
INTRAMUSCULAR | Status: DC | PRN
  Administered 2012-02-26: 4 mg via INTRAVENOUS

## 2012-02-26 MED ORDER — LACTATED RINGERS IV SOLN
INTRAVENOUS | Status: DC
  Administered 2012-02-26: 10:00:00 via INTRAVENOUS

## 2012-02-26 MED ORDER — SODIUM CHLORIDE 0.9 % IR SOLN
Status: DC | PRN
  Administered 2012-02-26 (×3)

## 2012-02-26 MED ORDER — ROCURONIUM BROMIDE 50 MG/5ML IV SOLN
INTRAVENOUS | Status: AC
  Filled 2012-02-26: qty 1

## 2012-02-26 MED ORDER — LIDOCAINE HCL (CARDIAC) 10 MG/ML IV SOLN
INTRAVENOUS | Status: DC | PRN
  Administered 2012-02-26: 20 mg via INTRAVENOUS

## 2012-02-26 MED ORDER — SODIUM CHLORIDE 0.9 % IR SOLN
Status: DC | PRN
  Administered 2012-02-26: 1000 mL

## 2012-02-26 MED ORDER — GLYCOPYRROLATE 0.2 MG/ML IJ SOLN
INTRAMUSCULAR | Status: AC
  Filled 2012-02-26: qty 2

## 2012-02-26 MED ORDER — SUCCINYLCHOLINE CHLORIDE 20 MG/ML IJ SOLN
INTRAMUSCULAR | Status: AC
  Filled 2012-02-26: qty 1

## 2012-02-26 MED ORDER — BUPIVACAINE-EPINEPHRINE PF 0.5-1:200000 % IJ SOLN
INTRAMUSCULAR | Status: AC
  Filled 2012-02-26: qty 10

## 2012-02-26 MED ORDER — CHLORHEXIDINE GLUCONATE 4 % EX LIQD: 60.0000 mL | Freq: Once | CUTANEOUS | Status: DC

## 2012-02-26 MED ORDER — ONDANSETRON HCL 4 MG/2ML IJ SOLN
INTRAMUSCULAR | Status: AC
  Filled 2012-02-26: qty 2

## 2012-02-26 MED ORDER — PROPOFOL 10 MG/ML IV BOLUS
INTRAVENOUS | Status: DC | PRN
  Administered 2012-02-26: 20 mg via INTRAVENOUS
  Administered 2012-02-26: 160 mg via INTRAVENOUS

## 2012-02-26 MED ORDER — HYDROCODONE-ACETAMINOPHEN 10-325 MG PO TABS: 1.0000 | ORAL_TABLET | ORAL | Status: DC | PRN

## 2012-02-26 MED ORDER — CEFAZOLIN SODIUM-DEXTROSE 2-3 GM-% IV SOLR
INTRAVENOUS | Status: AC
  Filled 2012-02-26: qty 50

## 2012-02-26 MED ORDER — FENTANYL CITRATE 0.05 MG/ML IJ SOLN
INTRAMUSCULAR | Status: AC
  Filled 2012-02-26: qty 5

## 2012-02-26 MED ORDER — PROMETHAZINE HCL 12.5 MG PO TABS: 12.5000 mg | ORAL_TABLET | Freq: Four times a day (QID) | ORAL | Status: DC | PRN

## 2012-02-26 MED ORDER — BUPIVACAINE-EPINEPHRINE PF 0.5-1:200000 % IJ SOLN
INTRAMUSCULAR | Status: DC | PRN
  Administered 2012-02-26: 60 mL

## 2012-02-26 MED ORDER — CELECOXIB 100 MG PO CAPS
400.0000 mg | ORAL_CAPSULE | Freq: Once | ORAL | Status: AC
  Administered 2012-02-26: 400 mg via ORAL

## 2012-02-26 MED ORDER — CEFAZOLIN SODIUM-DEXTROSE 2-3 GM-% IV SOLR
2.0000 g | INTRAVENOUS | Status: AC
  Administered 2012-02-26: 2 g via INTRAVENOUS

## 2012-02-26 MED ORDER — NEOSTIGMINE METHYLSULFATE 1 MG/ML IJ SOLN
INTRAMUSCULAR | Status: DC | PRN
  Administered 2012-02-26: 2 mg via INTRAVENOUS

## 2012-02-26 MED ORDER — DEXAMETHASONE SODIUM PHOSPHATE 4 MG/ML IJ SOLN
4.0000 mg | Freq: Once | INTRAMUSCULAR | Status: AC
  Administered 2012-02-26: 4 mg via INTRAVENOUS

## 2012-02-26 MED ORDER — CELECOXIB 100 MG PO CAPS
ORAL_CAPSULE | ORAL | Status: AC
  Filled 2012-02-26: qty 4

## 2012-02-26 MED ORDER — ROCURONIUM BROMIDE 100 MG/10ML IV SOLN
INTRAVENOUS | Status: DC | PRN
  Administered 2012-02-26: 10 mg via INTRAVENOUS
  Administered 2012-02-26: 5 mg via INTRAVENOUS
  Administered 2012-02-26: 25 mg via INTRAVENOUS
  Administered 2012-02-26: 10 mg via INTRAVENOUS

## 2012-02-26 SURGICAL SUPPLY — 54 items
ARTHROWAND PARAGON T2 (SURGICAL WAND)
BAG HAMPER (MISCELLANEOUS) ×2 IMPLANT
BANDAGE ELASTIC 6 VELCRO NS (GAUZE/BANDAGES/DRESSINGS) ×2 IMPLANT
BIT DRILL 2.0X128 (BIT) ×1 IMPLANT
BLADE AGGRESSIVE PLUS 4.0 (BLADE) ×2 IMPLANT
BLADE SURG SZ11 CARB STEEL (BLADE) ×2 IMPLANT
CHLORAPREP W/TINT 26ML (MISCELLANEOUS) ×4 IMPLANT
CLOTH BEACON ORANGE TIMEOUT ST (SAFETY) ×2 IMPLANT
COOLER CRYO IC GRAV AND TUBE (ORTHOPEDIC SUPPLIES) ×2 IMPLANT
COVER PROBE W GEL 5X96 (DRAPES) ×2 IMPLANT
CUFF CRYO KNEE LG 20X31 COOLER (ORTHOPEDIC SUPPLIES) IMPLANT
CUFF CRYO KNEE18X23 MED (MISCELLANEOUS) ×1 IMPLANT
CUFF TOURNIQUET SINGLE 34IN LL (TOURNIQUET CUFF) ×1 IMPLANT
CUFF TOURNIQUET SINGLE 44IN (TOURNIQUET CUFF) IMPLANT
CUTTER ANGLED DBL BITE 4.5 (BURR) IMPLANT
DECANTER SPIKE VIAL GLASS SM (MISCELLANEOUS) ×4 IMPLANT
GAUZE SPONGE 4X4 16PLY XRAY LF (GAUZE/BANDAGES/DRESSINGS) ×2 IMPLANT
GAUZE XEROFORM 5X9 LF (GAUZE/BANDAGES/DRESSINGS) ×2 IMPLANT
GLOVE ECLIPSE 7.0 STRL STRAW (GLOVE) ×1 IMPLANT
GLOVE EXAM NITRILE MD LF STRL (GLOVE) ×1 IMPLANT
GLOVE INDICATOR 7.5 STRL GRN (GLOVE) ×1 IMPLANT
GLOVE SKINSENSE NS SZ8.0 LF (GLOVE) ×1
GLOVE SKINSENSE STRL SZ8.0 LF (GLOVE) ×1 IMPLANT
GLOVE SS N UNI LF 8.5 STRL (GLOVE) ×2 IMPLANT
GOWN STRL REIN XL XLG (GOWN DISPOSABLE) ×4 IMPLANT
HLDR LEG FOAM (MISCELLANEOUS) ×1 IMPLANT
IV NS IRRIG 3000ML ARTHROMATIC (IV SOLUTION) ×7 IMPLANT
KIT BLADEGUARD II DBL (SET/KITS/TRAYS/PACK) ×2 IMPLANT
KIT ROOM TURNOVER AP CYSTO (KITS) ×2 IMPLANT
LEG HOLDER FOAM (MISCELLANEOUS) ×1
MANIFOLD NEPTUNE II (INSTRUMENTS) ×2 IMPLANT
MARKER SKIN DUAL TIP RULER LAB (MISCELLANEOUS) ×2 IMPLANT
NDL HYPO 18GX1.5 BLUNT FILL (NEEDLE) ×1 IMPLANT
NDL HYPO 21X1.5 SAFETY (NEEDLE) ×1 IMPLANT
NDL SPNL 18GX3.5 QUINCKE PK (NEEDLE) ×1 IMPLANT
NEEDLE HYPO 18GX1.5 BLUNT FILL (NEEDLE) ×2 IMPLANT
NEEDLE HYPO 21X1.5 SAFETY (NEEDLE) ×2 IMPLANT
NEEDLE SPNL 18GX3.5 QUINCKE PK (NEEDLE) ×2 IMPLANT
NS IRRIG 1000ML POUR BTL (IV SOLUTION) ×2 IMPLANT
PACK ARTHRO LIMB DRAPE STRL (MISCELLANEOUS) ×2 IMPLANT
PAD ABD 5X9 TENDERSORB (GAUZE/BANDAGES/DRESSINGS) ×2 IMPLANT
PAD ARMBOARD 7.5X6 YLW CONV (MISCELLANEOUS) ×2 IMPLANT
PADDING CAST COTTON 6X4 STRL (CAST SUPPLIES) ×2 IMPLANT
SET ARTHROSCOPY INST (INSTRUMENTS) ×2 IMPLANT
SET ARTHROSCOPY PUMP TUBE (IRRIGATION / IRRIGATOR) ×2 IMPLANT
SET BASIN LINEN APH (SET/KITS/TRAYS/PACK) ×2 IMPLANT
SPONGE GAUZE 4X4 12PLY (GAUZE/BANDAGES/DRESSINGS) ×2 IMPLANT
SUT ETHILON 3 0 FSL (SUTURE) ×1 IMPLANT
SYR 30ML LL (SYRINGE) ×2 IMPLANT
SYRINGE 10CC LL (SYRINGE) ×2 IMPLANT
WAND 50 DEG COVAC W/CORD (SURGICAL WAND) IMPLANT
WAND 90 DEG TURBOVAC W/CORD (SURGICAL WAND) ×1 IMPLANT
WAND ARTHRO PARAGON T2 (SURGICAL WAND) IMPLANT
YANKAUER SUCT BULB TIP 10FT TU (MISCELLANEOUS) ×6 IMPLANT

## 2012-02-26 NOTE — Transfer of Care (Signed)
Immediate Anesthesia Transfer of Care Note  Patient: Katelyn Thompson  Procedure(s) Performed: Procedure(s) (LRB): ANKLE ARTHROSCOPY WITH DRILLING/MICROFRACTURE (Right) ARTHROSCOPY KNEE (Right)  Patient Location: PACU  Anesthesia Type: General  Level of Consciousness: awake  Airway & Oxygen Therapy: Patient Spontanous Breathing and non-rebreather face mask  Post-op Assessment: Report given to PACU RN, Post -op Vital signs reviewed and stable and Patient moving all extremities  Post vital signs: Reviewed and stable  Complications: No apparent anesthesia complications

## 2012-02-26 NOTE — Op Note (Signed)
02/26/2012  11:33 AM  PATIENT:  Katelyn Thompson  46 y.o. female  PRE-OPERATIVE DIAGNOSIS:  loose body arthritis right knee  POST-OPERATIVE DIAGNOSIS:  arthritis of right knee  PROCEDURE:  Arthroscopy right knee with drilling microfracture, chondroplasty medial femoral condyle, chondroplasty lateral femoral condyle chondroplasty trochlea.  Operative findings grade 2 lesion medial femoral condyle, grade 3 lesion lateral femoral condyle, grade 4 lesion trochlea. Synovitis.  Details of the procedure  The patient was identified in the preop holding area and identified As Cecelia Byars, surgical site confirmed as right knee. Chart update was completed.  The patient was taken back to the operating room given weight appropriate antibiotic and given general anesthesia. No complications. Patient was in supine position. Patient was placed in an arthroscopic leg holder on the right in a well-leg padded holder on the left  After sterile prep and drape and timeout standard arthroscopy portals were established medially and laterally and a diagnostic arthroscopy was completed. A thorough tour of the knee was performed starting the patellofemoral joint working medially across the knee and circumflex frontal fashion into the lateral compartment and back into the patellofemoral joint. Synovitis was noted. The medial lateral menisci and anterior cruciate ligament were stable to palpation with the probe.  Chondral lesions were noted in the medial femoral condyle lateral femoral condyle and trochlea. After debridement of these lesions to a stable rim and border it was determined that the medial femoral condyle lesion was grade 2 needed no further treatment. The lateral lesion was grade 3 lesion needed no further treatment. The trochlear lesion was grade 4. An accessory portal was made with a spinal needle and a drilling microfracture type procedure was performed on the trochlea. Bleeding bone bed was confirmed.  Coagulation of the soft tissues were performed to decrease bleeding.  The knee was washed with arthroscopic fluid, suctioned of any debris. The knee was then injected Marcaine with epinephrine and closed portals with 3-0 nylon  SURGEON:  Surgeon(s) and Role:    * Vickki Hearing, MD - Primary  PHYSICIAN ASSISTANT:   ASSISTANTS: none   ANESTHESIA:   general  EBL:  Total I/O In: 800 [I.V.:800] Out: 10 [Blood:10]  BLOOD ADMINISTERED:none  DRAINS: none   LOCAL MEDICATIONS USED:  MARCAINE   , Amount: 60 ml and OTHER epi  SPECIMEN:  No Specimen  DISPOSITION OF SPECIMEN:    COUNTS:  YES  TOURNIQUET:  * Missing tourniquet times found for documented tourniquets in log:  16109 *  DICTATION: .Reubin Milan Dictation  PLAN OF CARE: Discharge to home after PACU  PATIENT DISPOSITION:  PACU - hemodynamically stable.   Delay start of Pharmacological VTE agent (>24hrs) due to surgical blood loss or risk of bleeding: not applicable  Postoperative plan is for early range of motion and weightbearing as tolerated as this is a patellofemoral non-chondral microfracture drilling procedure.

## 2012-02-26 NOTE — Anesthesia Postprocedure Evaluation (Signed)
Anesthesia Post Note  Patient: Katelyn Thompson  Procedure(s) Performed: Procedure(s) (LRB): ANKLE ARTHROSCOPY WITH DRILLING/MICROFRACTURE (Right) ARTHROSCOPY KNEE (Right)  Anesthesia type: General  Patient location: PACU  Post pain: Pain level controlled  Post assessment: Post-op Vital signs reviewed, Patient's Cardiovascular Status Stable, Respiratory Function Stable, Patent Airway, No signs of Nausea or vomiting and Pain level controlled  Last Vitals:  Filed Vitals:   02/26/12 1141  BP: 135/71  Pulse: 85  Temp: 37 C  Resp: 14    Post vital signs: Reviewed and stable  Level of consciousness: awake and alert   Complications: No apparent anesthesia complications

## 2012-02-26 NOTE — Anesthesia Procedure Notes (Signed)
Procedure Name: Intubation Date/Time: 02/26/2012 10:36 AM Performed by: Franco Nones Pre-anesthesia Checklist: Patient identified, Patient being monitored, Timeout performed, Emergency Drugs available and Suction available Patient Re-evaluated:Patient Re-evaluated prior to inductionOxygen Delivery Method: Circle System Utilized Preoxygenation: Pre-oxygenation with 100% oxygen Intubation Type: IV induction, Rapid sequence and Cricoid Pressure applied Laryngoscope Size: Miller and 2 Grade View: Grade I Tube type: Oral Tube size: 7.0 mm Number of attempts: 1 Airway Equipment and Method: stylet Placement Confirmation: ETT inserted through vocal cords under direct vision,  positive ETCO2 and breath sounds checked- equal and bilateral Secured at: 21 cm Tube secured with: Tape Dental Injury: Teeth and Oropharynx as per pre-operative assessment

## 2012-02-26 NOTE — Anesthesia Preprocedure Evaluation (Signed)
Anesthesia Evaluation  Patient identified by MRN, date of birth, ID band Patient awake    Reviewed: Allergy & Precautions, H&P , NPO status   History of Anesthesia Complications (+) PONV  Airway Mallampati: II    Mouth opening: Limited Mouth Opening  Dental  (+) Teeth Intact   Pulmonary COPD COPD inhaler,    + decreased breath sounds      Cardiovascular hypertension, Pt. on medications Rhythm:Regular Rate:Normal     Neuro/Psych PSYCHIATRIC DISORDERS Anxiety Depression    GI/Hepatic GERD-  Medicated and Controlled,  Endo/Other  Morbid obesity  Renal/GU      Musculoskeletal   Abdominal   Peds  Hematology   Anesthesia Other Findings   Reproductive/Obstetrics                           Anesthesia Physical Anesthesia Plan  ASA: III  Anesthesia Plan: General   Post-op Pain Management:    Induction: Intravenous, Rapid sequence and Cricoid pressure planned  Airway Management Planned: Oral ETT  Additional Equipment:   Intra-op Plan:   Post-operative Plan: Extubation in OR  Informed Consent: I have reviewed the patients History and Physical, chart, labs and discussed the procedure including the risks, benefits and alternatives for the proposed anesthesia with the patient or authorized representative who has indicated his/her understanding and acceptance.     Plan Discussed with:   Anesthesia Plan Comments:         Anesthesia Quick Evaluation

## 2012-02-26 NOTE — Brief Op Note (Addendum)
02/26/2012  11:33 AM  PATIENT:  Katelyn Thompson  46 y.o. female  PRE-OPERATIVE DIAGNOSIS:  loose body arthritis right knee  POST-OPERATIVE DIAGNOSIS:  arthritis of right knee  PROCEDURE:  Arthroscopy right knee with drilling microfracture, chondroplasty medial femoral condyle, chondroplasty lateral femoral condyle chondroplasty trochlea.  Operative findings grade 2 lesion medial femoral condyle, grade 3 lesion lateral femoral condyle, grade 4 lesion trochlea. Synovitis.  Details of the procedure  The patient was identified in the preop holding area and identified As Katelyn Thompson, surgical site confirmed as right knee. Chart update was completed.  The patient was taken back to the operating room given weight appropriate antibiotic and given general anesthesia. No complications. Patient was in supine position. Patient was placed in an arthroscopic leg holder on the right in a well-leg padded holder on the left  After sterile prep and drape and timeout standard arthroscopy portals were established medially and laterally and a diagnostic arthroscopy was completed. A thorough tour of the knee was performed starting the patellofemoral joint working medially across the knee and circumflex frontal fashion into the lateral compartment and back into the patellofemoral joint. Synovitis was noted. The medial lateral menisci and anterior cruciate ligament were stable to palpation with the probe.  Chondral lesions were noted in the medial femoral condyle lateral femoral condyle and trochlea. After debridement of these lesions to a stable rim and border it was determined that the medial femoral condyle lesion was grade 2 needed no further treatment. The lateral lesion was grade 3 lesion needed no further treatment. The trochlear lesion was grade 4. An accessory portal was made with a spinal needle and a drilling microfracture type procedure was performed on the trochlea. Bleeding bone bed was confirmed.  Coagulation of the soft tissues were performed to decrease bleeding.  The knee was washed with arthroscopic fluid, suctioned of any debris. The knee was then injected Marcaine with epinephrine and closed portals with 3-0 nylon  SURGEON:  Surgeon(s) and Role:    * Katelyn Pollina E Ta Fair, MD - Primary  PHYSICIAN ASSISTANT:   ASSISTANTS: none   ANESTHESIA:   general  EBL:  Total I/O In: 800 [I.V.:800] Out: 10 [Blood:10]  BLOOD ADMINISTERED:none  DRAINS: none   LOCAL MEDICATIONS USED:  MARCAINE   , Amount: 60 ml and OTHER epi  SPECIMEN:  No Specimen  DISPOSITION OF SPECIMEN:    COUNTS:  YES  TOURNIQUET:  * Missing tourniquet times found for documented tourniquets in log:  69987 *  DICTATION: .Dragon Dictation  PLAN OF CARE: Discharge to home after PACU  PATIENT DISPOSITION:  PACU - hemodynamically stable.   Delay start of Pharmacological VTE agent (>24hrs) due to surgical blood loss or risk of bleeding: not applicable  Postoperative plan is for early range of motion and weightbearing as tolerated as this is a patellofemoral non-chondral microfracture drilling procedure. 

## 2012-02-26 NOTE — Interval H&P Note (Signed)
History and Physical Interval Note:  02/26/2012 10:23 AM  Katelyn Thompson  has presented today for surgery, with the diagnosis of loose body arthritis right knee  The various methods of treatment have been discussed with the patient and family. After consideration of risks, benefits and other options for treatment, the patient has consented to  Procedure(s) (LRB) with comments: ARTHROSCOPY KNEE (Right) as a surgical intervention .  The patient's history has been reviewed, patient examined, no change in status, stable for surgery.  I have reviewed the patient's chart and labs.  Questions were answered to the patient's satisfaction.     Fuller Canada

## 2012-02-28 ENCOUNTER — Telehealth: Payer: Self-pay | Admitting: *Deleted

## 2012-02-28 ENCOUNTER — Encounter (HOSPITAL_COMMUNITY): Payer: Self-pay | Admitting: Orthopedic Surgery

## 2012-02-28 ENCOUNTER — Ambulatory Visit (INDEPENDENT_AMBULATORY_CARE_PROVIDER_SITE_OTHER): Payer: Medicare Other | Admitting: Orthopedic Surgery

## 2012-02-28 VITALS — Ht 64.5 in | Wt 220.0 lb

## 2012-02-28 DIAGNOSIS — Z9889 Other specified postprocedural states: Secondary | ICD-10-CM

## 2012-02-28 NOTE — Telephone Encounter (Signed)
Katelyn Thompson called today. She would like to pick up some samples of dexilant today. Please follow up. Thanks.

## 2012-02-28 NOTE — Progress Notes (Signed)
Patient ID: Katelyn Thompson, female   DOB: 1966-02-21, 46 y.o.   MRN: [id number] Chief Complaint  Patient presents with  . Follow-up    Post op #1, right knee.    1. S/P right knee arthroscopy  Ambulatory referral to Physical Therapy   MICROFRACTURE TROCHLEA   KNEE LOOKS GOOD SUTURES WERE REMOVED   START THERAPY   F/U 4 WEEKS

## 2012-02-28 NOTE — Patient Instructions (Signed)
START THERAPY   CONTINUE ICE   CRUTCH OR CRUTCHES AS NEEDED

## 2012-02-29 ENCOUNTER — Ambulatory Visit: Payer: Medicare Other | Admitting: Orthopedic Surgery

## 2012-02-29 NOTE — Telephone Encounter (Signed)
3 boxes of dexilant at the front desk for pt. She is aware. Also included pt assistance forms for dexilant.

## 2012-02-29 NOTE — Telephone Encounter (Signed)
agree

## 2012-03-11 ENCOUNTER — Ambulatory Visit (INDEPENDENT_AMBULATORY_CARE_PROVIDER_SITE_OTHER): Payer: Medicare Other | Admitting: Orthopedic Surgery

## 2012-03-11 ENCOUNTER — Encounter: Payer: Self-pay | Admitting: Orthopedic Surgery

## 2012-03-11 ENCOUNTER — Ambulatory Visit: Payer: Medicare Other | Admitting: Physical Therapy

## 2012-03-11 VITALS — Ht 64.5 in | Wt 220.0 lb

## 2012-03-11 DIAGNOSIS — M1710 Unilateral primary osteoarthritis, unspecified knee: Secondary | ICD-10-CM

## 2012-03-11 NOTE — Progress Notes (Signed)
Patient ID: Katelyn Thompson, female   DOB: 1966-01-25, 46 y.o.   MRN: [id number] Chief Complaint  Patient presents with  . Follow-up    Recheck right knee incision site./November 18 microfracture of the trochlea    1. OA (osteoarthritis) of knee     Check portals, swollen   Portals look good, no cause for alarm   Keep appointment as scheduled

## 2012-03-15 ENCOUNTER — Telehealth: Payer: Self-pay

## 2012-03-15 NOTE — Telephone Encounter (Signed)
Pt is want more samples of the Dexilant. We gave her some in November. Can we give her anymore?

## 2012-03-18 NOTE — Telephone Encounter (Signed)
Pt aware of samples up front  

## 2012-03-18 NOTE — Telephone Encounter (Signed)
Yes. Let's have her come in for routine OV, f/u on constipation and GERD. Not urgent.

## 2012-03-21 ENCOUNTER — Ambulatory Visit (INDEPENDENT_AMBULATORY_CARE_PROVIDER_SITE_OTHER): Payer: Medicare Other | Admitting: Gastroenterology

## 2012-03-21 ENCOUNTER — Encounter: Payer: Self-pay | Admitting: Gastroenterology

## 2012-03-21 VITALS — BP 128/90 | HR 101 | Temp 98.4°F | Ht 65.0 in | Wt 223.0 lb

## 2012-03-21 DIAGNOSIS — K5909 Other constipation: Secondary | ICD-10-CM

## 2012-03-21 DIAGNOSIS — K2270 Barrett's esophagus without dysplasia: Secondary | ICD-10-CM

## 2012-03-21 DIAGNOSIS — K5900 Constipation, unspecified: Secondary | ICD-10-CM

## 2012-03-21 MED ORDER — LINACLOTIDE 290 MCG PO CAPS: 1.0000 | ORAL_CAPSULE | Freq: Every day | ORAL | Status: AC

## 2012-03-21 MED ORDER — DEXLANSOPRAZOLE 60 MG PO CPDR: 60.0000 mg | DELAYED_RELEASE_CAPSULE | Freq: Every day | ORAL | Status: AC

## 2012-03-21 NOTE — Progress Notes (Signed)
Referring Provider: Joette Catching, MD Primary Care Physician:  Josue Hector, MD PRIMARY GI: DR. Jena Gauss   Chief Complaint  Patient presents with  . Follow-up  . Gastrophageal Reflux  . Constipation    HPI:   Pleasant 46 year old female with hx of constipation, short-segment Barrett's. EGD and TCS Aug 2013 as noted in [facility]. No metaplasia, dysplasia noted on EGD.  Amitiza nor Miralax helped. Only enemas helped. +reflux. Takes Dexilant in morning, Protonix in evening. Eats chocolate, "chocaholic". Spaghetti or pizza tries to stay away from. Kills her. Up a few lbs. Quit smoking in June. Dysphagia improved. Pills not getting hung. Dexilant is 80$ a month with insurance. Nexium too expensive in the past.   Past Medical History  Diagnosis Date  . GERD (gastroesophageal reflux disease)   . COPD (chronic obstructive pulmonary disease)   . HTN (hypertension)   . Nerve damage     Severe to left foot  . Bipolar 1 disorder   . Anxiety and depression     Does not like being around lots of people  . Hypercholesteremia   . MS (multiple sclerosis)     but not offcially disgnosed  . Barrett's esophagus without dysplasia   . Arthritis   . PONV (postoperative nausea and vomiting)   . Depression   . Anxiety     Past Surgical History  Procedure Date  . S/p hysterectomy   . Tubal ligation   . Sinus surgery with instatrak   . Foot surgery     x 2  . Esophagogastroduodenoscopy 06/2010    diagnosed with Barrett's, small hh, esophagus dilated. Next EGD 06/2011  . Colonoscopy 11/20/2011    RMR: Friable anorectum; single anal canal hemorrhoidal tag. Otherwise normal rectum and colon  . Surgery of lip     biopsy  . Ankle arthroscopy with drilling/microfracture 02/26/2012    Procedure: ANKLE ARTHROSCOPY WITH DRILLING/MICROFRACTURE;  Surgeon: Vickki Hearing, MD;  Location: [facility];  Service: Orthopedics;  Laterality: Right;  . Knee arthroscopy 02/26/2012    Procedure: ARTHROSCOPY  KNEE;  Surgeon: Vickki Hearing, MD;  Location: [facility];  Service: Orthopedics;  Laterality: Right;  with three chondroplasties  . Esophagogastroduodenoscopy Aug 2013    RMR: abnormal distal esophagus consistent with prior dx of SS Barrett;s. s/p dilation. small hiatal hernia. Biopsies: no metaplasia, dysplasia, malignancy. SURVEILLANCE AUG 2016    Current Outpatient Prescriptions  Medication Sig Dispense Refill  . acetaminophen (TYLENOL) 500 MG tablet Take 1,000 mg by mouth every 6 (six) hours as needed. Pain      . ADVANCED FIBER COMPLEX PO Take 2 capsules by mouth daily.      Marland Kitchen albuterol (PROVENTIL) (2.5 MG/3ML) 0.083% nebulizer solution Take 2.5 mg by nebulization every 6 (six) hours as needed. For shortness of breath      . ALPRAZolam (XANAX) 1 MG tablet Take 1 mg by mouth 3 (three) times daily as needed. For anxiety      . amitriptyline (ELAVIL) 25 MG tablet Take 25 mg by mouth at bedtime.        Marland Kitchen amLODipine (NORVASC) 5 MG tablet Take 5 mg by mouth every morning.       Marland Kitchen BLACK COHOSH PO Take 175 mg by mouth 3 (three) times daily.      . carbamazepine (TEGRETOL) 100 MG chewable tablet Chew 100 mg by mouth daily.       . cyclobenzaprine (FLEXERIL) 10 MG tablet Take 1 tablet (10 mg total) by mouth 3 (  three) times daily as needed. Muscle Spasms  60 tablet  2  . dexlansoprazole (DEXILANT) 60 MG capsule Take 1 capsule (60 mg total) by mouth daily.  30 capsule  3  . dextromethorphan-guaiFENesin (MUCINEX DM) 30-600 MG per 12 hr tablet Take 1 tablet by mouth every 12 (twelve) hours.        . ergocalciferol (VITAMIN D2) 50000 UNITS capsule Take 50,000 Units by mouth once a week. On fridays      . gabapentin (NEURONTIN) 800 MG tablet Take 800 mg by mouth 3 (three) times daily.        Marland Kitchen HYDROcodone-acetaminophen (NORCO) 10-325 MG per tablet Take 1 tablet by mouth every 4 (four) hours as needed for pain.  42 tablet  3  . lamoTRIgine (LAMICTAL) 100 MG tablet Take 100 mg by mouth every morning.        Marland Kitchen lisinopril (PRINIVIL,ZESTRIL) 10 MG tablet Take 10 mg by mouth every morning.       . Mometasone Furo-Formoterol Fum (DULERA) 200-5 MCG/ACT AERO Inhale 2 puffs into the lungs 2 (two) times daily.       . mupirocin ointment (BACTROBAN) 2 % Apply 1 application topically 3 (three) times daily. Use for 7-10 days to affected area of Lip      . oxyCODONE-acetaminophen (PERCOCET/ROXICET) 5-325 MG per tablet Take 1 tablet by mouth every 4 (four) hours as needed.      . pantoprazole (PROTONIX) 40 MG tablet Take 1 tablet (40 mg total) by mouth 2 (two) times daily.  60 tablet  3  . promethazine (PHENERGAN) 12.5 MG tablet Take 1 tablet (12.5 mg total) by mouth every 6 (six) hours as needed for nausea.  30 tablet  0  . roflumilast (DALIRESP) 500 MCG TABS tablet Take 500 mcg by mouth every morning.       . SEROQUEL XR 50 MG TB24 Take 50 mg by mouth at bedtime.       . traZODone (DESYREL) 150 MG tablet Take 300 mg by mouth at bedtime.       . Linaclotide 290 MCG CAPS Take 1 capsule by mouth daily. 30 minutes before a meal  30 capsule  3    Allergies as of 03/21/2012 - Review Complete 03/21/2012  Allergen Reaction Noted  . Aspirin Other (See Comments) 06/13/2010  . Nitrofurantoin Nausea And Vomiting 06/13/2010  . Relafen (nabumetone) Nausea And Vomiting 01/05/2011    Family History  Problem Relation Age of Onset  . Colon cancer Neg Hx   . Heart disease Mother   . Heart attack Brother 48    deceased  . Diabetes Mother   . Asthma Mother   . Lung disease Mother   . Arthritis Mother   . Allergies Mother     History   Social History  . Marital Status: Married    Spouse Name: N/A    Number of Children: N/A  . Years of Education: 9th grade   Occupational History  . disabled    Social History Main Topics  . Smoking status: Former Smoker -- 2.0 packs/day for 34 years    Types: Cigarettes    Quit date: 09/17/2011  . Smokeless tobacco: Never Used     Comment: up to 3 ppd  . Alcohol Use: Yes      Comment: rarely  . Drug Use: No  . Sexually Active: Yes    Birth Control/ Protection: Surgical   Other Topics Concern  . None   Social History Narrative  .  None    Review of Systems: Negative unless otherwise mentioned in HPI  Physical Exam: BP 128/90  Pulse 101  Temp 98.4 F (36.9 C) (Oral)  Ht 5\' 5"  (1.651 m)  Wt 223 lb (101.152 kg)  BMI 37.11 kg/m2 General:   Alert and oriented. No distress noted. Pleasant and cooperative.  Head:  Normocephalic and atraumatic. Eyes:  Conjuctiva clear without scleral icterus. Mouth:  Oral mucosa pink and moist. Good dentition. No lesions. Heart:  S1, S2 present without murmurs, rubs, or gallops. Regular rate and rhythm. Abdomen:  +BS, soft, non-tender and non-distended. No rebound or guarding. No HSM or masses noted. Msk:  Symmetrical without gross deformities. Normal posture. Extremities:  Without edema. Neurologic:  Alert and  oriented x4;  grossly normal neurologically. Skin:  Intact without significant lesions or rashes. Psych:  Alert and cooperative. Normal mood and affect.

## 2012-03-21 NOTE — Patient Instructions (Addendum)
Continue taking Dexilant daily. If you need an additional reflux medication, you may take the Protonix at night. Do not take more than 2 reflux medications a day. It is important that you follow the reflux diet and work on gradual weight loss to help with symptoms.  Start taking Linzess 290 mcg (1 capsule), each morning, 30 minutes before your first meal. This is for constipation.   We will see you back in 6 months or sooner as needed.   Have a [place] Christmas!

## 2012-03-27 ENCOUNTER — Ambulatory Visit (INDEPENDENT_AMBULATORY_CARE_PROVIDER_SITE_OTHER): Payer: Medicare Other | Admitting: Orthopedic Surgery

## 2012-03-27 VITALS — BP 120/80 | Ht 64.0 in | Wt 220.0 lb

## 2012-03-27 DIAGNOSIS — M1710 Unilateral primary osteoarthritis, unspecified knee: Secondary | ICD-10-CM

## 2012-03-27 DIAGNOSIS — M179 Osteoarthritis of knee, unspecified: Secondary | ICD-10-CM

## 2012-03-27 NOTE — Progress Notes (Signed)
Patient ID: Katelyn Thompson, female   DOB: [birth year], 46 y.o.   MRN: [id number] Chief Complaint  Patient presents with  . Follow-up    follow up right knee DOS 02/26/12    Doing well except for the stairs, she has mild swelling   The knee has FROM, mild effusion  Weakness in the quadriceps  "I can't afford therapy"  So she will try an HEP with a PEP pad  Return 8 wks for 12 wk f/u

## 2012-03-27 NOTE — Patient Instructions (Addendum)
Home exercises daily   Ice as needed

## 2012-03-28 ENCOUNTER — Encounter: Payer: Self-pay | Admitting: Gastroenterology

## 2012-03-28 NOTE — Assessment & Plan Note (Signed)
Failure of Amitiza and Miralax. Start Linzess 290 mcg. Return in 6 months.

## 2012-03-28 NOTE — Progress Notes (Signed)
Faxed to PCP

## 2012-03-28 NOTE — Assessment & Plan Note (Signed)
Most recent EGD Aug 2013, no metaplasia or dysplasia. Needs surveillance in Aug 2016. Tight control of GERD; continue Dexilant. She has taken Protonix in the evening prn. I do not see an issue with this prn. HOWEVER, I have counseled on importance of aggressive dietary and behavior modification to include weight loss. Return in 6 months.

## 2012-04-11 ENCOUNTER — Telehealth: Payer: Self-pay | Admitting: *Deleted

## 2012-04-11 NOTE — Telephone Encounter (Signed)
Spoke with Linzess rep- he is going to check with pt assistance for linzess and see if they will help with copays.

## 2012-04-11 NOTE — Telephone Encounter (Signed)
I have also sent a message to the dexilant rep to see if dexilant pt assistance will help with copays.

## 2012-04-11 NOTE — Telephone Encounter (Signed)
Katelyn Thompson called today. She needs some free samples of dexilant and linzess. Her co-pay is too much for her. Please follow up. Thanks.

## 2012-04-15 NOTE — Telephone Encounter (Signed)
Left  pt #3 boxes of linzess and 3 boxes of dexilant 60mg  at the front desk. Also gave pt information about a subsidy program through medicare that helps pts pay for prescriptions and copies of pt assistance paperwork for linzess and dexilant. Pt is aware of these programs and encouraged her to apply since we do not always have samples available of her medications. Asked her to read them and fill them out and mail back to [place] so we can fill out our part of the forms and fax them in for her. Pt verbalized understanding.

## 2012-04-15 NOTE — Telephone Encounter (Signed)
Agree 

## 2012-04-28 ENCOUNTER — Other Ambulatory Visit: Payer: Self-pay | Admitting: Gastroenterology

## 2012-04-29 ENCOUNTER — Other Ambulatory Visit: Payer: Self-pay

## 2012-04-29 MED ORDER — PANTOPRAZOLE SODIUM 40 MG PO TBEC: 40.0000 mg | DELAYED_RELEASE_TABLET | Freq: Every day | ORAL | Status: DC

## 2012-04-29 NOTE — Telephone Encounter (Signed)
Is patient on Dexilant or protonix 21m BID? Please let me know. Thanks

## 2012-04-29 NOTE — Telephone Encounter (Signed)
Per AS ov note- pt is taking dexilant daily and protonix at bedtime if needed. Unable to reach pt by phone. We recently sent her patient assistance forms for her medications.

## 2012-04-30 ENCOUNTER — Telehealth: Payer: Self-pay | Admitting: *Deleted

## 2012-04-30 NOTE — Telephone Encounter (Signed)
Patient requesting refill request on Cyclobenzapr 10mg  #60 and also Norco 10-325 mg #42 Pharmacy [facility]

## 2012-05-01 NOTE — Telephone Encounter (Signed)
You can refill hydrocodone 5 mg  # 30 , 5 refills and cyclobenzaprine 10 # 60 5 refills

## 2012-05-02 ENCOUNTER — Other Ambulatory Visit: Payer: Self-pay | Admitting: *Deleted

## 2012-05-02 DIAGNOSIS — M23329 Other meniscus derangements, posterior horn of medial meniscus, unspecified knee: Secondary | ICD-10-CM

## 2012-05-02 DIAGNOSIS — M1710 Unilateral primary osteoarthritis, unspecified knee: Secondary | ICD-10-CM

## 2012-05-02 MED ORDER — CYCLOBENZAPRINE HCL 10 MG PO TABS: 10.0000 mg | ORAL_TABLET | Freq: Three times a day (TID) | ORAL | Status: DC | PRN

## 2012-05-02 MED ORDER — HYDROCODONE-ACETAMINOPHEN 5-325 MG PO TABS: 1.0000 | ORAL_TABLET | ORAL | Status: DC | PRN

## 2012-05-07 ENCOUNTER — Ambulatory Visit (INDEPENDENT_AMBULATORY_CARE_PROVIDER_SITE_OTHER): Payer: Medicare Other | Admitting: Orthopedic Surgery

## 2012-05-07 ENCOUNTER — Ambulatory Visit (INDEPENDENT_AMBULATORY_CARE_PROVIDER_SITE_OTHER): Payer: Medicare Other

## 2012-05-07 VITALS — BP 138/90 | Ht 64.0 in | Wt 220.0 lb

## 2012-05-07 DIAGNOSIS — M1710 Unilateral primary osteoarthritis, unspecified knee: Secondary | ICD-10-CM

## 2012-05-07 NOTE — Patient Instructions (Signed)
Wear knee brace   Take norco pain   Ice as needed

## 2012-05-07 NOTE — Progress Notes (Signed)
Patient ID: Katelyn Thompson, female   DOB: Aug 30, 1965, 47 y.o.   MRN: [id number]  Chief Complaint  Patient presents with  . Knee Pain    fluid and pain to right knee    Chondral lesions were noted in the medial femoral condyle lateral femoral condyle and trochlea. After debridement of these lesions to a stable rim and border it was determined that the medial femoral condyle lesion was grade 2 needed no further treatment. The lateral lesion was grade 3 lesion needed no further treatment. The trochlear lesion was grade 4. An accessory portal was made with a spinal needle and a drilling microfracture type procedure was performed on the trochlea. Bleeding bone bed was confirmed. Coagulation of the soft tissues were performed to decrease bleeding.  She complains of pain and swelling status post microfracture patellofemoral joint and chondroplasties lateral and medial on 02/26/2012  She's maintained her range of motion just increased pain  Knee shows effusion flexion passively is 125 full extension although painful with tenderness around the knee joint suggesting inflamed synovium neurovascular exam is intact she does not support herself with any devices for ambulation  Impression knee effusion  Knee aspiration removed 60 cc of [employer] colored fluid  Aspiration RIGHT knee Verbal consent and timeout were completed for a RIGHT knee aspiration  Under sterile conditions the RIGHT knee was aspirated from a lateral approach.  Findings were clear yellow fluid 50 cc  A sterile dressing was applied there were no complications

## 2012-05-09 ENCOUNTER — Telehealth: Payer: Self-pay | Admitting: Internal Medicine

## 2012-05-09 MED ORDER — PANTOPRAZOLE SODIUM 40 MG PO TBEC: 40.0000 mg | DELAYED_RELEASE_TABLET | Freq: Two times a day (BID) | ORAL | Status: DC

## 2012-05-09 NOTE — Telephone Encounter (Signed)
Pt is aware.  

## 2012-05-09 NOTE — Telephone Encounter (Signed)
I have sent Protonix BID to pharmacy. She needs to review the GERD diet. Exercise and weight loss are key factors here.  Avoid chocolate.    For constipation: I would encourage, as you already have, to fill out the patient assistance forms again. With a new fiscal year, perhaps these companies may look at things differently. If she continues to refuse, the option would be Miralax daily to BID.

## 2012-05-09 NOTE — Addendum Note (Signed)
Addended by: Nira Retort on: 05/09/2012 01:02 PM   Modules accepted: Orders

## 2012-05-09 NOTE — Telephone Encounter (Signed)
Pt called to say that since she can not get Dexilant samples and is refusing to fill out patient assistance papers to help her with cost of prescription she tells me that instead of taking her protonix once a day that she is taking it 3 times a day. I told her that I spoke with nurse and she is NOT to take protonix 3 times a day and maybe we could get [employer] to approve her taking it twice a day. Patient said that was not going to work because she chokes on acid reflux at night and once or twice a day will not help. I again told her that the nurse said not to take the protonix 3 times a day and if she thought the Dexilant worked better for her maybe she should fill out the patient assistance papers so she could see what she would qualify for. She said her husband makes too much money and it would do no good. I transferred call to Hendricks Limes, LPN

## 2012-05-09 NOTE — Telephone Encounter (Signed)
Spoke with pt- she cannot afford her dexilant or linzess rx. Pt stated she didn't fill out patient assistance forms because she has tried to fill them out in the past and her husband makes too much money for her to qualify. She normally takes dexilant qam and protonix qpm, but since she has been out of dexilant samples, she has been taking protonix bid. Occasionally she will take it tid. Advised pt to not take it tid but she said sometimes she has to. Pt is out of linzess samples too. She has taken amitiza in the past and it doesn't help. She stated linzess didn't help that much either but it was better than the amitiza. We are out of linzess samples.      Pt is almost out of protonix and is requesting new rx for bid sent in.  Pt also wants to know what she can take for constipation that is not so expensive.   Please advise.

## 2012-05-29 ENCOUNTER — Ambulatory Visit (INDEPENDENT_AMBULATORY_CARE_PROVIDER_SITE_OTHER): Payer: Medicare Other | Admitting: Orthopedic Surgery

## 2012-05-29 VITALS — BP 114/84 | Ht 64.0 in | Wt 220.0 lb

## 2012-05-29 DIAGNOSIS — M179 Osteoarthritis of knee, unspecified: Secondary | ICD-10-CM

## 2012-05-29 DIAGNOSIS — M1710 Unilateral primary osteoarthritis, unspecified knee: Secondary | ICD-10-CM

## 2012-05-29 DIAGNOSIS — M25469 Effusion, unspecified knee: Secondary | ICD-10-CM

## 2012-05-29 MED ORDER — PREDNISONE 10 MG PO KIT: 10.0000 mg | PACK | ORAL | Status: DC

## 2012-05-29 NOTE — Patient Instructions (Addendum)
Start prednisone   Take for 12 days and come back in 2 weeks

## 2012-05-29 NOTE — Progress Notes (Signed)
Patient ID: Katelyn Thompson, female   DOB: 07/27/1965, 47 y.o.   MRN: [id number] Chief Complaint  Patient presents with  . Follow-up    Recheck right knee. DOS 02-26-12, SARK, microfracture trochlea.    Patient comes back today with continued pain in her right knee swelling in her left knee status post microfracture trochlea with chondral lesion grade 2 medial femoral condyle operative note as noted below  Chondral lesions were noted in the medial femoral condyle lateral femoral condyle and trochlea. After debridement of these lesions to a stable rim and border it was determined that the medial femoral condyle lesion was grade 2 needed no further treatment. The lateral lesion was grade 3 lesion needed no further treatment. The trochlear lesion was grade 4. An accessory portal was made with a spinal needle and a drilling microfracture type procedure was performed on the trochlea. Bleeding bone bed was confirmed. Coagulation of the soft tissues were performed to decrease bleeding.   Evaluation the right knee painful flexion passive range of motion is more than then active 125 versus 100 knee comes to full extension mild effusion  I think at this point she is heading towards total knee land.  It's 3 months since her surgery she's not making any improvement she says "I can't live with this"  She's also having swelling in fusion of the left knee not as much pain  Norco not working for pain doubt she would benefit from more narcotics  Recommend prednisone Dosepak and she'll let me know when she comes back with a she's at the end of her rope  Note she's also in a brace not controlling her pain

## 2012-06-12 ENCOUNTER — Encounter: Payer: Self-pay | Admitting: Orthopedic Surgery

## 2012-06-12 ENCOUNTER — Ambulatory Visit (INDEPENDENT_AMBULATORY_CARE_PROVIDER_SITE_OTHER): Payer: Medicare Other | Admitting: Orthopedic Surgery

## 2012-06-12 VITALS — BP 120/90 | Ht 64.0 in | Wt 220.0 lb

## 2012-06-12 DIAGNOSIS — M1710 Unilateral primary osteoarthritis, unspecified knee: Secondary | ICD-10-CM

## 2012-06-12 DIAGNOSIS — M1711 Unilateral primary osteoarthritis, right knee: Secondary | ICD-10-CM

## 2012-06-12 NOTE — Patient Instructions (Addendum)
Pending insurance phone call   Planning Right tka

## 2012-06-12 NOTE — Progress Notes (Signed)
Patient ID: Katelyn Thompson, female   DOB: [birth year], 47 y.o.   MRN: [id number] Chief Complaint  Patient presents with  . Follow-up    SARK and response to medication DOS 02/26/12    History patient is still complaining of pain and swelling in right knee even after prednisone Dosepak  She is considering knee replacement surgery  Review of systems no back pain no numbness no tingling  Exam shows  BP 120/90  Ht 5\' 4"  (1.626 m)  Wt 220 lb (99.791 kg)  BMI 37.74 kg/m2 General appearance normal oriented x3 mood and affect normal ambulation with a cane and a brace knee flexion ARC 125 swelling tenderness neurovascular exam intact  Patient is concerned about her postoperative cost including postoperative in-house rehabilitation at home she'll call her insurance company if she is satisfied with cost she'll call us back to schedule right 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

## 2012-06-13 ENCOUNTER — Other Ambulatory Visit: Payer: Self-pay | Admitting: *Deleted

## 2012-06-13 ENCOUNTER — Telehealth: Payer: Self-pay | Admitting: Orthopedic Surgery

## 2012-06-13 NOTE — Telephone Encounter (Signed)
Scheduled patient for surgery 07/01/12

## 2012-06-13 NOTE — Telephone Encounter (Signed)
Patient called in regard to information about insurance and home care, as discussed at her office visit yesterday, as to the scheduling of total knee replacement surgery.  She does wish to schedule it for as soon as possible.  She is aware of the need for pre-authorization by her insurance, [employer].  She said she called her insurance about home therapy, and that it is covered.  She said she was also told that insurance would cover a nurse to come to her home, up to 35 hours per week, if order would be written.    I relayed to patient that I will send a copy of her insurance card to one of our home care providers, [facility], to have them provide additional information as to how the home care benefits works with her plan.  Please advise patient about a surgery date, possibly late March?  She also mentioned "on a Friday" if possible.  Her home ph# is [phone] (Home)

## 2012-06-18 ENCOUNTER — Telehealth: Payer: Self-pay | Admitting: Orthopedic Surgery

## 2012-06-18 NOTE — Telephone Encounter (Signed)
Contacted insurer, [employer], ph# [phone], regarding in-patient/admit for total knee arthroscopy, CPT [phone], ICD9 [phone] -per Christiana Fuchs, notification # [phone], status "pending."  Our office will be contacted if clinicals are required for review and approval.

## 2012-06-19 NOTE — Telephone Encounter (Signed)
Received call from [employer], [place], request for clinicals for nurse review for planned surgery.  Clinicals faxed to # [phone]. (Follow up on status by 06/24/12 if no reply)

## 2012-06-25 ENCOUNTER — Other Ambulatory Visit: Payer: Self-pay | Admitting: *Deleted

## 2012-06-25 NOTE — Patient Instructions (Addendum)
Katelyn Thompson  06/25/2012   Your procedure is scheduled on:  07/01/2012  Report to [facility] at  615  AM.  Call this number if you have problems the morning of surgery: [phone]   Remember:   Do not eat food or drink liquids after midnight.   Take these medicines the morning of surgery with A SIP OF WATER: flexaril,dexilant,norco,tegretal,neurontin,lisinopril,oxycodone,protonix,phenergan,prednisone,xanax,elavil,norvasc. Take proventil before you come and bring it with you.   Do not wear jewelry, make-up or nail polish.  Do not wear lotions, powders, or perfumes.   Do not shave 48 hours prior to surgery. Men may shave face and neck.  Do not bring valuables to the hospital.  Contacts, dentures or bridgework may not be worn into surgery.  Leave suitcase in the car. After surgery it may be brought to your room.  For patients admitted to the hospital, checkout time is 11:00 AM the day of discharge.   Patients discharged the day of surgery will not be allowed to drive  home.  Name and phone number of your driver: family  Special Instructions: Shower using CHG 2 nights before surgery and the night before surgery.  If you shower the day of surgery use CHG.  Use special wash - you have one bottle of CHG for all showers.  You should use approximately 1/3 of the bottle for each shower.   Please read over the following fact sheets that you were given: Pain Booklet, Coughing and Deep Breathing, Blood Transfusion Information, Total Joint Packet, MRSA Information, Surgical Site Infection Prevention, Anesthesia Post-op Instructions and Care and Recovery After Surgery Total Knee Replacement Total knee replacement is a procedure to replace your knee joint with an artificial knee joint (prosthetic knee joint). The purpose of this surgery is to reduce pain and improve your knee function. LET YOUR CAREGIVER KNOW ABOUT:   Any allergies you have.  Any medicines you are taking, including vitamins,  herbs, eyedrops, over-the-counter medicines, and creams.  Any problems you have had with the use of anesthetics.  Family history of problems with the use of anesthetics.  Any blood disorders you have, including bleeding problems or clotting problems.  Previous surgeries you have had. RISKS AND COMPLICATIONS  Generally, total knee replacement is a safe procedure. However, as with any surgical procedure, complications can occur. Possible complications associated with total knee replacement include:  Loss of range of motion of the knee or instability.  Loosening of the prosthesis.  Infection.  Persistent pain. BEFORE THE PROCEDURE   Your caregiver will instruct you when you need to stop eating and drinking.  Ask your caregiver if you need to change or stop any regular medicines. PROCEDURE  Just before the procedure you will receive medicine that will make you drowsy (sedative). This will be given through a tube that is inserted into one of your veins (intravenous [IV] tube). Then you will either receive medicine to block pain from the waist down through your legs (spinal block) or medicine to also receive medicine to make you fall asleep (general anesthetic). You may also receive medicine to block feeling in your leg (nerve block) to help ease pain after surgery. An incision will be made in your knee. Your surgeon will take out any damaged cartilage and bone by sawing off the damaged surfaces. Then the surgeon will put a new metal liner over the sawed off portion of your thigh bone (femur) and a plastic liner over the sawed off portion of  one of the bones of your lower leg (tibia). This is to restore alignment and function to your knee. A plastic piece is often used to restore the surface of your knee cap. AFTER THE PROCEDURE  You will be taken to the recovery area. You may have drainage tubes to drain excess fluid from your knee. These tubes attach to a device that removes these fluids.  Once you are awake, stable, and taking fluids well, you will be taken to your hospital room. You will receive physical therapy as prescribed by your caregiver. The length of your stay in the hospital after a knee replacement is 2 4 days. Your surgeon may recommend that you spend time (usually an additional 10 14 days) in an extended-care facility to help you begin walking again and improve your range of motion before you go home. You may also be prescribed blood-thinning medicine to decrease your risk of developing blood clots in your leg. Document Released: 07/03/2000 Document Revised: 09/26/2011 Document Reviewed: 05/07/2011 [facility] Patient Information 2013 [place], [place]. PATIENT INSTRUCTIONS POST-ANESTHESIA  IMMEDIATELY FOLLOWING SURGERY:  Do not drive or operate machinery for the first twenty four hours after surgery.  Do not make any important decisions for twenty four hours after surgery or while taking narcotic pain medications or sedatives.  If you develop intractable nausea and vomiting or a severe headache please notify your doctor immediately.  FOLLOW-UP:  Please make an appointment with your surgeon as instructed. You do not need to follow up with anesthesia unless specifically instructed to do so.  WOUND CARE INSTRUCTIONS (if applicable):  Keep a dry clean dressing on the anesthesia/puncture wound site if there is drainage.  Once the wound has quit draining you may leave it open to air.  Generally you should leave the bandage intact for twenty four hours unless there is drainage.  If the epidural site drains for more than 36-48 hours please call the anesthesia department.  QUESTIONS?:  Please feel free to call your physician or the hospital operator if you have any questions, and they will be happy to assist you.

## 2012-06-26 ENCOUNTER — Encounter (HOSPITAL_COMMUNITY): Payer: Self-pay

## 2012-06-26 ENCOUNTER — Encounter (HOSPITAL_COMMUNITY)
Admission: RE | Admit: 2012-06-26 | Discharge: 2012-06-26 | Disposition: A | Discharge status: Home / Self Care | Payer: Medicare Other | Source: Ambulatory Visit | Attending: Orthopedic Surgery | Admitting: Orthopedic Surgery

## 2012-06-26 ENCOUNTER — Encounter (HOSPITAL_COMMUNITY): Payer: Self-pay | Admitting: Pharmacy Technician

## 2012-06-26 LAB — CBC
Hemoglobin: 14.2 g/dL (ref 12.0–15.0)
MCH: 30.8 pg (ref 26.0–34.0)
MCV: 89.2 fL (ref 78.0–100.0)
RBC: 4.61 MIL/uL (ref 3.87–5.11)

## 2012-06-26 LAB — BASIC METABOLIC PANEL
BUN: 7 mg/dL (ref 6–23)
CO2: 27 mEq/L (ref 19–32)
Calcium: 9.6 mg/dL (ref 8.4–10.5)
Creatinine, Ser: 0.6 mg/dL (ref 0.50–1.10)

## 2012-06-26 LAB — SURGICAL PCR SCREEN
MRSA, PCR: NEGATIVE
Staphylococcus aureus: NEGATIVE

## 2012-06-26 LAB — ABO/RH: ABO/RH(D): A POS

## 2012-06-28 NOTE — Telephone Encounter (Signed)
As per [employer] authorization/notification system, "Approved" status as of 06/27/12 for surgery noted.Marland Kitchen

## 2012-06-30 NOTE — H&P (Signed)
TOTAL KNEE ADMISSION H&P  Patient is being admitted for right total knee arthroplasty.  Subjective:  Chief Complaint:right knee pain.  HPI: Katelyn Thompson, 47 y.o. female, has a history of pain and functional disability in the right knee due to arthritis and has failed non-surgical conservative treatments for greater than 12 weeks to includeNSAID's and/or analgesics, corticosteriod injections, flexibility and strengthening excercises, use of assistive devices and activity modification.  Onset of symptoms was gradual, starting < 1  years ago with gradually worsening course since that time. The patient noted prior procedures on the knee to include  arthroscopy and microfracture  on the right knee(s).  Patient currently rates pain in the right knee(s) at 8 out of 10 with activity. Patient has night pain, worsening of pain with activity and weight bearing, pain that interferes with activities of daily living, pain with passive range of motion and joint swelling.  Patient has evidence of joint space narrowing and multicompartment chodral thinning  by imaging studies.   Patient Active Problem List   Diagnosis Date Noted  . Hypertension 02/20/2012  . Effusion of knee joint 01/31/2012  . Medial meniscus, posterior horn derangement 01/31/2012  . OA (osteoarthritis) of knee 01/31/2012  . Acute exacerbation of chronic obstructive pulmonary disease (COPD) 09/22/2011  . Tobacco abuse 09/22/2011  . Rotator cuff syndrome of left shoulder 01/05/2011  . Herniated disc, cervical 01/05/2011  . Barrett's esophagus without dysplasia 09/26/2010  . Constipation, chronic 09/26/2010  . Rectal bleeding 09/26/2010  . Esophageal dysphagia 06/13/2010  . ABDOMINAL PAIN-EPIGASTRIC 06/13/2010   Past Medical History  Diagnosis Date  . GERD (gastroesophageal reflux disease)   . COPD (chronic obstructive pulmonary disease)   . HTN (hypertension)   . Nerve damage     Severe to left foot  . Bipolar 1 disorder   .  Anxiety and depression     Does not like being around lots of people  . Hypercholesteremia   . MS (multiple sclerosis)     but not offcially disgnosed  . Barrett's esophagus without dysplasia   . Arthritis   . PONV (postoperative nausea and vomiting)   . Depression   . Anxiety     Past Surgical History  Procedure Laterality Date  . S/p hysterectomy    . Tubal ligation    . Sinus surgery with instatrak    . Foot surgery      x 2  . Esophagogastroduodenoscopy  06/2010    diagnosed with Barrett's, small hh, esophagus dilated. Next EGD 06/2011  . Colonoscopy  11/20/2011    RMR: Friable anorectum; single anal canal hemorrhoidal tag. Otherwise normal rectum and colon  . Surgery of lip      biopsy  . Ankle arthroscopy with drilling/microfracture  02/26/2012    Procedure: ANKLE ARTHROSCOPY WITH DRILLING/MICROFRACTURE;  Surgeon: Vickki Hearing, MD;  Location: [facility];  Service: Orthopedics;  Laterality: Right;  . Knee arthroscopy  02/26/2012    Procedure: ARTHROSCOPY KNEE;  Surgeon: Vickki Hearing, MD;  Location: [facility];  Service: Orthopedics;  Laterality: Right;  with three chondroplasties  . Esophagogastroduodenoscopy  Aug 2013    RMR: abnormal distal esophagus consistent with prior dx of SS Barrett;s. s/p dilation. small hiatal hernia. Biopsies: no metaplasia, dysplasia, malignancy. SURVEILLANCE AUG 2016  . Abdominal hysterectomy      No prescriptions prior to admission   Allergies  Allergen Reactions  . Aspirin Other (See Comments)    esophagus disease  . Nitrofurantoin Nausea And Vomiting  .  Relafen (Nabumetone) Nausea And Vomiting    History  Substance Use Topics  . Smoking status: Former Smoker -- 2.00 packs/day for 34 years    Types: Cigarettes    Quit date: 09/17/2011  . Smokeless tobacco: Never Used     Comment: up to 3 ppd  . Alcohol Use: Yes     Comment: rarely    Family History  Problem Relation Age of Onset  . Colon cancer Neg Hx   . Heart disease Mother    . Heart attack Brother 48    deceased  . Diabetes Mother   . Asthma Mother   . Lung disease Mother   . Arthritis Mother   . Allergies Mother      Review of Systems  Musculoskeletal: Positive for joint pain.  All other systems reviewed and are negative.    Objective:  Physical Exam  Vitals reviewed. Constitutional: She is oriented to person, place, and time. She appears well-developed and well-nourished. No distress.  HENT:  Head: Normocephalic and atraumatic.  Eyes: Pupils are equal, round, and reactive to light.  Neck: Normal range of motion. No JVD present. No tracheal deviation present.  Cardiovascular: Normal rate.   Respiratory: Effort normal.  GI: She exhibits no distension.  Musculoskeletal: Normal range of motion.       Right hip: Normal.       Left hip: Normal.       Right knee: She exhibits swelling, effusion and bony tenderness. She exhibits normal range of motion, no ecchymosis, no deformity, no laceration, no erythema, normal alignment, no LCL laxity, normal patellar mobility, normal meniscus and no MCL laxity. Tenderness found. Medial joint line tenderness noted. No lateral joint line, no MCL, no LCL and no patellar tendon tenderness noted.       Left knee: Normal.       Right ankle: Normal.       Left ankle: Normal.       Lumbar back: Normal.       Right lower leg: Normal.       Left lower leg: Normal.       Right foot: Normal.       Left foot: Normal.  Upper extremity exam  The right and left upper extremity:  Inspection and palpation revealed no abnormalities in the upper extremities.  Range of motion is full without contracture. Motor exam is normal with grade 5 strength. The joints are fully reduced without subluxation. There is no atrophy or tremor and muscle tone is normal.  All joints are stable.      Lymphadenopathy:    She has no cervical adenopathy.  Neurological: She is alert and oriented to person, place, and time. She has normal  reflexes. No cranial nerve deficit. She exhibits normal muscle tone. Coordination normal.  Skin: Skin is warm and dry. She is not diaphoretic.  Psychiatric: She has a normal mood and affect. Her behavior is normal. Judgment and thought content normal.    Vital signs in last 24 hours:    Labs:   Estimated body mass index is 37.74 kg/(m^2) as calculated from the following:   Height as of 06/12/12: 5\' 4"  (1.626 m).   Weight as of 06/12/12: 99.791 kg (220 lb).   Imaging Review Plain radiographs demonstrate mild degenerative joint disease of the right knee(s). The overall alignment isneutral. The bone quality appears to be excellent for age and reported activity level.  Assessment/Plan:  End stage arthritis, right knee   The patient  history, physical examination, clinical judgment of the provider and imaging studies are consistent with end stage degenerative joint disease of the right knee(s) and total knee arthroplasty is deemed medically necessary. The treatment options including medical management, injection therapy arthroscopy and arthroplasty were discussed at length. The risks and benefits of total knee arthroplasty were presented and reviewed. The risks due to aseptic loosening, infection, stiffness, patella tracking problems, thromboembolic complications and other imponderables were discussed. The patient acknowledged the explanation, agreed to proceed with the plan and consent was signed. Patient is being admitted for inpatient treatment for surgery, pain control, PT, OT, prophylactic antibiotics, VTE prophylaxis, progressive ambulation and ADL's and discharge planning. The patient is planning to be discharged home with home health services  Right TOTAL KNEE

## 2012-07-01 ENCOUNTER — Encounter (HOSPITAL_COMMUNITY): Payer: Self-pay | Admitting: Anesthesiology

## 2012-07-01 ENCOUNTER — Inpatient Hospital Stay (HOSPITAL_COMMUNITY): Payer: Medicare Other | Admitting: Anesthesiology

## 2012-07-01 ENCOUNTER — Encounter (HOSPITAL_COMMUNITY)
Admission: RE | Disposition: A | Discharge status: Home Health | Payer: Self-pay | Source: Ambulatory Visit | Attending: Orthopedic Surgery

## 2012-07-01 ENCOUNTER — Inpatient Hospital Stay (HOSPITAL_COMMUNITY): Payer: Medicare Other

## 2012-07-01 ENCOUNTER — Inpatient Hospital Stay (HOSPITAL_COMMUNITY)
Admission: RE | Admit: 2012-07-01 | Discharge: 2012-07-04 | DRG: 470 | Disposition: A | Discharge status: Home Health | Payer: Medicare Other | Source: Ambulatory Visit | Attending: Orthopedic Surgery | Admitting: Orthopedic Surgery

## 2012-07-01 DIAGNOSIS — Z8261 Family history of arthritis: Secondary | ICD-10-CM

## 2012-07-01 DIAGNOSIS — K219 Gastro-esophageal reflux disease without esophagitis: Secondary | ICD-10-CM | POA: Diagnosis present

## 2012-07-01 DIAGNOSIS — Z800 Family history of malignant neoplasm of digestive organs: Secondary | ICD-10-CM

## 2012-07-01 DIAGNOSIS — Z825 Family history of asthma and other chronic lower respiratory diseases: Secondary | ICD-10-CM

## 2012-07-01 DIAGNOSIS — J449 Chronic obstructive pulmonary disease, unspecified: Secondary | ICD-10-CM | POA: Diagnosis present

## 2012-07-01 DIAGNOSIS — F3130 Bipolar disorder, current episode depressed, mild or moderate severity, unspecified: Secondary | ICD-10-CM | POA: Diagnosis present

## 2012-07-01 DIAGNOSIS — Z8249 Family history of ischemic heart disease and other diseases of the circulatory system: Secondary | ICD-10-CM

## 2012-07-01 DIAGNOSIS — M1710 Unilateral primary osteoarthritis, unspecified knee: Principal | ICD-10-CM | POA: Diagnosis present

## 2012-07-01 DIAGNOSIS — K5900 Constipation, unspecified: Secondary | ICD-10-CM | POA: Diagnosis present

## 2012-07-01 DIAGNOSIS — Z833 Family history of diabetes mellitus: Secondary | ICD-10-CM

## 2012-07-01 DIAGNOSIS — E7800 Pure hypercholesterolemia, unspecified: Secondary | ICD-10-CM | POA: Diagnosis present

## 2012-07-01 DIAGNOSIS — Z87891 Personal history of nicotine dependence: Secondary | ICD-10-CM

## 2012-07-01 DIAGNOSIS — J4489 Other specified chronic obstructive pulmonary disease: Secondary | ICD-10-CM | POA: Diagnosis present

## 2012-07-01 DIAGNOSIS — F411 Generalized anxiety disorder: Secondary | ICD-10-CM | POA: Diagnosis present

## 2012-07-01 DIAGNOSIS — I10 Essential (primary) hypertension: Secondary | ICD-10-CM | POA: Diagnosis present

## 2012-07-01 DIAGNOSIS — G35 Multiple sclerosis: Secondary | ICD-10-CM | POA: Diagnosis present

## 2012-07-01 HISTORY — PX: TOTAL KNEE ARTHROPLASTY: SHX125

## 2012-07-01 HISTORY — DX: Unilateral primary osteoarthritis, unspecified knee: M17.10

## 2012-07-01 SURGERY — ARTHROPLASTY, KNEE, TOTAL
Anesthesia: Spinal | Site: Knee | Laterality: Right | Wound class: Clean

## 2012-07-01 MED ORDER — ONDANSETRON HCL 4 MG PO TABS
4.0000 mg | ORAL_TABLET | Freq: Four times a day (QID) | ORAL | Status: DC | PRN
  Administered 2012-07-01: 4 mg via ORAL
  Filled 2012-07-01: qty 1

## 2012-07-01 MED ORDER — GABAPENTIN 400 MG PO CAPS
800.0000 mg | ORAL_CAPSULE | Freq: Three times a day (TID) | ORAL | Status: DC
  Administered 2012-07-01 – 2012-07-04 (×9): 800 mg via ORAL
  Filled 2012-07-01: qty 1
  Filled 2012-07-01 (×9): qty 2

## 2012-07-01 MED ORDER — ACETAMINOPHEN 10 MG/ML IV SOLN
INTRAVENOUS | Status: AC
  Filled 2012-07-01: qty 100

## 2012-07-01 MED ORDER — PROPOFOL 10 MG/ML IV EMUL
INTRAVENOUS | Status: AC
  Filled 2012-07-01: qty 20

## 2012-07-01 MED ORDER — LISINOPRIL 10 MG PO TABS
10.0000 mg | ORAL_TABLET | Freq: Every morning | ORAL | Status: DC
  Administered 2012-07-02 – 2012-07-04 (×3): 10 mg via ORAL
  Filled 2012-07-01 (×4): qty 1

## 2012-07-01 MED ORDER — DOCUSATE SODIUM 100 MG PO CAPS
100.0000 mg | ORAL_CAPSULE | Freq: Two times a day (BID) | ORAL | Status: DC
  Administered 2012-07-01 – 2012-07-04 (×7): 100 mg via ORAL
  Filled 2012-07-01 (×7): qty 1

## 2012-07-01 MED ORDER — METHOCARBAMOL 500 MG PO TABS
500.0000 mg | ORAL_TABLET | Freq: Four times a day (QID) | ORAL | Status: DC | PRN
  Administered 2012-07-02 – 2012-07-03 (×3): 500 mg via ORAL
  Filled 2012-07-01 (×3): qty 1

## 2012-07-01 MED ORDER — ALBUTEROL SULFATE (5 MG/ML) 0.5% IN NEBU: 2.5000 mg | INHALATION_SOLUTION | Freq: Four times a day (QID) | RESPIRATORY_TRACT | Status: DC | PRN

## 2012-07-01 MED ORDER — ROFLUMILAST 500 MCG PO TABS
500.0000 ug | ORAL_TABLET | Freq: Every morning | ORAL | Status: DC
  Administered 2012-07-02 – 2012-07-04 (×3): 500 ug via ORAL
  Filled 2012-07-01 (×4): qty 1

## 2012-07-01 MED ORDER — FENTANYL CITRATE 0.05 MG/ML IJ SOLN: 25.0000 ug | INTRAMUSCULAR | Status: DC | PRN

## 2012-07-01 MED ORDER — MOMETASONE FURO-FORMOTEROL FUM 200-5 MCG/ACT IN AERO
2.0000 | INHALATION_SPRAY | Freq: Two times a day (BID) | RESPIRATORY_TRACT | Status: DC
  Administered 2012-07-01 – 2012-07-04 (×6): 2 via RESPIRATORY_TRACT
  Filled 2012-07-01: qty 8.8

## 2012-07-01 MED ORDER — ERGOCALCIFEROL 1.25 MG (50000 UT) PO CAPS
50000.0000 [IU] | ORAL_CAPSULE | ORAL | Status: DC
  Filled 2012-07-01: qty 1

## 2012-07-01 MED ORDER — METOCLOPRAMIDE HCL 10 MG PO TABS
5.0000 mg | ORAL_TABLET | Freq: Three times a day (TID) | ORAL | Status: DC | PRN
  Administered 2012-07-01: 10 mg via ORAL
  Filled 2012-07-01: qty 1

## 2012-07-01 MED ORDER — ONDANSETRON HCL 4 MG/2ML IJ SOLN
INTRAMUSCULAR | Status: AC
  Filled 2012-07-01: qty 2

## 2012-07-01 MED ORDER — CARBAMAZEPINE 100 MG PO CHEW
100.0000 mg | CHEWABLE_TABLET | Freq: Every day | ORAL | Status: DC
  Administered 2012-07-02 – 2012-07-04 (×3): 100 mg via ORAL
  Filled 2012-07-01 (×4): qty 1

## 2012-07-01 MED ORDER — GLYCOPYRROLATE 0.2 MG/ML IJ SOLN
0.2000 mg | Freq: Once | INTRAMUSCULAR | Status: AC
  Administered 2012-07-01: 0.2 mg via INTRAVENOUS

## 2012-07-01 MED ORDER — LACTATED RINGERS IV SOLN
INTRAVENOUS | Status: DC
  Administered 2012-07-01 – 2012-07-02 (×3): via INTRAVENOUS

## 2012-07-01 MED ORDER — MAGNESIUM CITRATE PO SOLN: 1.0000 | Freq: Once | ORAL | Status: AC | PRN

## 2012-07-01 MED ORDER — ALPRAZOLAM 1 MG PO TABS
1.0000 mg | ORAL_TABLET | Freq: Three times a day (TID) | ORAL | Status: DC | PRN
  Administered 2012-07-02 – 2012-07-03 (×6): 1 mg via ORAL
  Filled 2012-07-01 (×6): qty 1

## 2012-07-01 MED ORDER — RIVAROXABAN 10 MG PO TABS: 10.0000 mg | ORAL_TABLET | Freq: Every day | ORAL | Status: DC

## 2012-07-01 MED ORDER — GLYCOPYRROLATE 0.2 MG/ML IJ SOLN
INTRAMUSCULAR | Status: AC
  Filled 2012-07-01: qty 1

## 2012-07-01 MED ORDER — DIPHENHYDRAMINE HCL 12.5 MG/5ML PO ELIX: 12.5000 mg | ORAL_SOLUTION | ORAL | Status: DC | PRN

## 2012-07-01 MED ORDER — CEFAZOLIN SODIUM-DEXTROSE 2-3 GM-% IV SOLR: 2.0000 g | INTRAVENOUS | Status: DC

## 2012-07-01 MED ORDER — ACETAMINOPHEN 10 MG/ML IV SOLN
1000.0000 mg | Freq: Four times a day (QID) | INTRAVENOUS | Status: AC
  Administered 2012-07-01 – 2012-07-02 (×3): 1000 mg via INTRAVENOUS
  Filled 2012-07-01 (×4): qty 100

## 2012-07-01 MED ORDER — AMLODIPINE BESYLATE 5 MG PO TABS
5.0000 mg | ORAL_TABLET | Freq: Every morning | ORAL | Status: DC
  Administered 2012-07-02 – 2012-07-04 (×3): 5 mg via ORAL
  Filled 2012-07-01 (×4): qty 1

## 2012-07-01 MED ORDER — HYDROMORPHONE HCL PF 1 MG/ML IJ SOLN
0.5000 mg | INTRAMUSCULAR | Status: DC | PRN
  Administered 2012-07-01 (×2): 0.5 mg via INTRAVENOUS
  Filled 2012-07-01 (×3): qty 1

## 2012-07-01 MED ORDER — MIDAZOLAM HCL 2 MG/2ML IJ SOLN
1.0000 mg | INTRAMUSCULAR | Status: DC | PRN
  Administered 2012-07-01: 2 mg via INTRAVENOUS

## 2012-07-01 MED ORDER — CELECOXIB 100 MG PO CAPS
ORAL_CAPSULE | ORAL | Status: AC
  Filled 2012-07-01: qty 4

## 2012-07-01 MED ORDER — CEFAZOLIN SODIUM-DEXTROSE 2-3 GM-% IV SOLR
INTRAVENOUS | Status: AC
  Filled 2012-07-01: qty 50

## 2012-07-01 MED ORDER — PANTOPRAZOLE SODIUM 40 MG PO TBEC
40.0000 mg | DELAYED_RELEASE_TABLET | Freq: Two times a day (BID) | ORAL | Status: DC
  Administered 2012-07-01 – 2012-07-04 (×6): 40 mg via ORAL
  Filled 2012-07-01 (×7): qty 1

## 2012-07-01 MED ORDER — SODIUM CHLORIDE 0.9 % IR SOLN
Status: DC | PRN
  Administered 2012-07-01: 1000 mL

## 2012-07-01 MED ORDER — ACETAMINOPHEN 10 MG/ML IV SOLN
1000.0000 mg | Freq: Once | INTRAVENOUS | Status: AC
  Administered 2012-07-01: 1000 mg via INTRAVENOUS

## 2012-07-01 MED ORDER — ONDANSETRON HCL 4 MG/2ML IJ SOLN
4.0000 mg | Freq: Once | INTRAMUSCULAR | Status: AC
  Administered 2012-07-01: 4 mg via INTRAVENOUS

## 2012-07-01 MED ORDER — LINACLOTIDE 290 MCG PO CAPS
1.0000 | ORAL_CAPSULE | Freq: Every day | ORAL | Status: DC
  Filled 2012-07-01 (×4): qty 290

## 2012-07-01 MED ORDER — PHENOL 1.4 % MT LIQD: 1.0000 | OROMUCOSAL | Status: DC | PRN

## 2012-07-01 MED ORDER — MIDAZOLAM HCL 2 MG/2ML IJ SOLN
INTRAMUSCULAR | Status: AC
  Filled 2012-07-01: qty 2

## 2012-07-01 MED ORDER — DM-GUAIFENESIN ER 30-600 MG PO TB12
1.0000 | ORAL_TABLET | Freq: Two times a day (BID) | ORAL | Status: DC
  Administered 2012-07-01 – 2012-07-03 (×6): 1 via ORAL
  Filled 2012-07-01 (×6): qty 1

## 2012-07-01 MED ORDER — FENTANYL CITRATE 0.05 MG/ML IJ SOLN
INTRAMUSCULAR | Status: DC | PRN
  Administered 2012-07-01: 25 ug via INTRATHECAL

## 2012-07-01 MED ORDER — BUPIVACAINE IN DEXTROSE 0.75-8.25 % IT SOLN
INTRATHECAL | Status: DC | PRN
  Administered 2012-07-01: 15 mg via INTRATHECAL

## 2012-07-01 MED ORDER — BUPIVACAINE-EPINEPHRINE (PF) 0.5% -1:200000 IJ SOLN
INTRAMUSCULAR | Status: DC | PRN
  Administered 2012-07-01: 90 mL

## 2012-07-01 MED ORDER — PROPOFOL INFUSION 10 MG/ML OPTIME
INTRAVENOUS | Status: DC | PRN
  Administered 2012-07-01: 40 ug/kg/min via INTRAVENOUS

## 2012-07-01 MED ORDER — METHOCARBAMOL 100 MG/ML IJ SOLN
500.0000 mg | Freq: Four times a day (QID) | INTRAVENOUS | Status: DC | PRN
  Filled 2012-07-01: qty 5

## 2012-07-01 MED ORDER — SENNA 8.6 MG PO TABS
1.0000 | ORAL_TABLET | Freq: Two times a day (BID) | ORAL | Status: DC
  Administered 2012-07-01 – 2012-07-04 (×7): 8.6 mg via ORAL
  Filled 2012-07-01 (×7): qty 1

## 2012-07-01 MED ORDER — CEFAZOLIN SODIUM-DEXTROSE 2-3 GM-% IV SOLR
INTRAVENOUS | Status: DC | PRN
  Administered 2012-07-01: 2 g via INTRAVENOUS

## 2012-07-01 MED ORDER — OXYCODONE HCL 5 MG PO TABS
5.0000 mg | ORAL_TABLET | ORAL | Status: DC
  Administered 2012-07-01 – 2012-07-04 (×18): 5 mg via ORAL
  Filled 2012-07-01 (×18): qty 1

## 2012-07-01 MED ORDER — FAMOTIDINE IN NACL 20-0.9 MG/50ML-% IV SOLN
20.0000 mg | Freq: Once | INTRAVENOUS | Status: DC
  Administered 2012-07-01: 20 mg via INTRAVENOUS
  Filled 2012-07-01: qty 50

## 2012-07-01 MED ORDER — AMITRIPTYLINE HCL 25 MG PO TABS
25.0000 mg | ORAL_TABLET | Freq: Every day | ORAL | Status: DC
  Administered 2012-07-01 – 2012-07-03 (×3): 25 mg via ORAL
  Filled 2012-07-01 (×3): qty 1

## 2012-07-01 MED ORDER — CHLORHEXIDINE GLUCONATE 4 % EX LIQD: 60.0000 mL | Freq: Once | CUTANEOUS | Status: DC

## 2012-07-01 MED ORDER — OXYCODONE HCL 5 MG PO TABS
ORAL_TABLET | ORAL | Status: AC
  Filled 2012-07-01: qty 1

## 2012-07-01 MED ORDER — QUETIAPINE FUMARATE ER 50 MG PO TB24
50.0000 mg | ORAL_TABLET | Freq: Every day | ORAL | Status: DC
Start: 2012-07-01 — End: 2012-07-04
  Administered 2012-07-01 – 2012-07-03 (×3): 50 mg via ORAL
  Filled 2012-07-01 (×4): qty 1

## 2012-07-01 MED ORDER — FENTANYL CITRATE 0.05 MG/ML IJ SOLN
INTRAMUSCULAR | Status: AC
  Filled 2012-07-01: qty 2

## 2012-07-01 MED ORDER — ALUM & MAG HYDROXIDE-SIMETH 200-200-20 MG/5ML PO SUSP: 30.0000 mL | ORAL | Status: DC | PRN

## 2012-07-01 MED ORDER — OXYCODONE HCL 5 MG PO TABS
5.0000 mg | ORAL_TABLET | Freq: Once | ORAL | Status: AC
  Administered 2012-07-01: 5 mg via ORAL

## 2012-07-01 MED ORDER — LAMOTRIGINE 100 MG PO TABS
100.0000 mg | ORAL_TABLET | Freq: Every morning | ORAL | Status: DC
  Administered 2012-07-02 – 2012-07-04 (×3): 100 mg via ORAL
  Filled 2012-07-01 (×4): qty 1

## 2012-07-01 MED ORDER — MENTHOL 3 MG MT LOZG: 1.0000 | LOZENGE | OROMUCOSAL | Status: DC | PRN

## 2012-07-01 MED ORDER — CYCLOBENZAPRINE HCL 10 MG PO TABS: 10.0000 mg | ORAL_TABLET | Freq: Three times a day (TID) | ORAL | Status: DC | PRN

## 2012-07-01 MED ORDER — PREGABALIN 50 MG PO CAPS
50.0000 mg | ORAL_CAPSULE | Freq: Once | ORAL | Status: AC
  Administered 2012-07-01: 50 mg via ORAL

## 2012-07-01 MED ORDER — MUPIROCIN 2 % EX OINT: 1.0000 "application " | TOPICAL_OINTMENT | Freq: Three times a day (TID) | CUTANEOUS | Status: DC

## 2012-07-01 MED ORDER — CELECOXIB 100 MG PO CAPS
200.0000 mg | ORAL_CAPSULE | Freq: Two times a day (BID) | ORAL | Status: DC
  Administered 2012-07-01 – 2012-07-04 (×6): 200 mg via ORAL
  Filled 2012-07-01 (×6): qty 2

## 2012-07-01 MED ORDER — ONDANSETRON HCL 4 MG/2ML IJ SOLN: 4.0000 mg | Freq: Once | INTRAMUSCULAR | Status: DC | PRN

## 2012-07-01 MED ORDER — LACTATED RINGERS IV SOLN
INTRAVENOUS | Status: DC
  Administered 2012-07-01 (×2): via INTRAVENOUS

## 2012-07-01 MED ORDER — CEFAZOLIN SODIUM-DEXTROSE 2-3 GM-% IV SOLR
2.0000 g | Freq: Four times a day (QID) | INTRAVENOUS | Status: AC
  Administered 2012-07-01 (×2): 2 g via INTRAVENOUS
  Filled 2012-07-01 (×2): qty 50

## 2012-07-01 MED ORDER — MIDAZOLAM HCL 5 MG/5ML IJ SOLN
INTRAMUSCULAR | Status: DC | PRN
  Administered 2012-07-01 (×2): 1 mg via INTRAVENOUS

## 2012-07-01 MED ORDER — FENTANYL CITRATE 0.05 MG/ML IJ SOLN
INTRAMUSCULAR | Status: DC | PRN
  Administered 2012-07-01 (×3): 25 ug via INTRAVENOUS

## 2012-07-01 MED ORDER — HYDROMORPHONE HCL PF 1 MG/ML IJ SOLN
1.0000 mg | INTRAMUSCULAR | Status: DC | PRN
  Administered 2012-07-01 – 2012-07-03 (×7): 1 mg via INTRAVENOUS
  Filled 2012-07-01 (×7): qty 1

## 2012-07-01 MED ORDER — SENNOSIDES-DOCUSATE SODIUM 8.6-50 MG PO TABS: 1.0000 | ORAL_TABLET | Freq: Every evening | ORAL | Status: DC | PRN

## 2012-07-01 MED ORDER — BUPIVACAINE-EPINEPHRINE PF 0.5-1:200000 % IJ SOLN
INTRAMUSCULAR | Status: AC
  Filled 2012-07-01: qty 30

## 2012-07-01 MED ORDER — CELECOXIB 100 MG PO CAPS
400.0000 mg | ORAL_CAPSULE | Freq: Once | ORAL | Status: AC
  Administered 2012-07-01: 400 mg via ORAL

## 2012-07-01 MED ORDER — ONDANSETRON HCL 4 MG/2ML IJ SOLN
4.0000 mg | Freq: Four times a day (QID) | INTRAMUSCULAR | Status: DC | PRN
  Administered 2012-07-02 (×2): 4 mg via INTRAVENOUS
  Filled 2012-07-01 (×2): qty 2

## 2012-07-01 MED ORDER — PREGABALIN 50 MG PO CAPS
ORAL_CAPSULE | ORAL | Status: AC
  Filled 2012-07-01: qty 1

## 2012-07-01 MED ORDER — TRAZODONE HCL 50 MG PO TABS
300.0000 mg | ORAL_TABLET | Freq: Every day | ORAL | Status: DC
  Administered 2012-07-01 – 2012-07-03 (×3): 300 mg via ORAL
  Filled 2012-07-01 (×3): qty 6

## 2012-07-01 MED ORDER — BUPIVACAINE IN DEXTROSE 0.75-8.25 % IT SOLN
INTRATHECAL | Status: AC
  Filled 2012-07-01: qty 2

## 2012-07-01 MED ORDER — METOCLOPRAMIDE HCL 5 MG/ML IJ SOLN: 5.0000 mg | Freq: Three times a day (TID) | INTRAMUSCULAR | Status: DC | PRN

## 2012-07-01 MED ORDER — BISACODYL 5 MG PO TBEC: 5.0000 mg | DELAYED_RELEASE_TABLET | Freq: Every day | ORAL | Status: DC | PRN

## 2012-07-01 SURGICAL SUPPLY — 73 items
BAG HAMPER (MISCELLANEOUS) ×2 IMPLANT
BANDAGE ESMARK 6X9 LF (GAUZE/BANDAGES/DRESSINGS) ×1 IMPLANT
BIT DRILL 2.8X128 (BIT) ×2 IMPLANT
BIT DRILL 3.2X128 (BIT) IMPLANT
BLADE HEX COATED 2.75 (ELECTRODE) ×2 IMPLANT
BLADE SAG 18X100X1.27 (BLADE) IMPLANT
BLADE SAGITTAL 25.0X1.27X90 (BLADE) ×2 IMPLANT
BLADE SAW SAG 90X13X1.27 (BLADE) ×2 IMPLANT
BNDG CMPR 9X6 STRL LF SNTH (GAUZE/BANDAGES/DRESSINGS) ×1
BNDG ESMARK 6X9 LF (GAUZE/BANDAGES/DRESSINGS) ×2
BOWL SMART MIX CTS (DISPOSABLE) IMPLANT
CEMENT HV SMART SET (Cement) ×4 IMPLANT
CLOTH BEACON ORANGE TIMEOUT ST (SAFETY) ×2 IMPLANT
COVER LIGHT HANDLE STERIS (MISCELLANEOUS) ×4 IMPLANT
COVER PROBE W GEL 5X96 (DRAPES) ×2 IMPLANT
CUFF TOURNIQUET SINGLE 34IN LL (TOURNIQUET CUFF) ×1 IMPLANT
CUFF TOURNIQUET SINGLE 44IN (TOURNIQUET CUFF) IMPLANT
DECANTER SPIKE VIAL GLASS SM (MISCELLANEOUS) ×4 IMPLANT
DRAPE BACK TABLE (DRAPES) ×2 IMPLANT
DRAPE EXTREMITY T 121X128X90 (DRAPE) ×2 IMPLANT
DRSG MEPILEX BORDER 4X12 (GAUZE/BANDAGES/DRESSINGS) ×2 IMPLANT
DURAPREP 26ML APPLICATOR (WOUND CARE) ×4 IMPLANT
ELECT REM PT RETURN 9FT ADLT (ELECTROSURGICAL) ×2
ELECTRODE REM PT RTRN 9FT ADLT (ELECTROSURGICAL) ×1 IMPLANT
EVACUATOR 3/16  PVC DRAIN (DRAIN) ×1
EVACUATOR 3/16 PVC DRAIN (DRAIN) ×1 IMPLANT
FACESHIELD LNG OPTICON STERILE (SAFETY) IMPLANT
GLOVE ECLIPSE 6.5 STRL STRAW (GLOVE) ×1 IMPLANT
GLOVE EXAM NITRILE MD LF STRL (GLOVE) ×2 IMPLANT
GLOVE INDICATOR 7.0 STRL GRN (GLOVE) ×2 IMPLANT
GLOVE INDICATOR 8.0 STRL GRN (GLOVE) ×1 IMPLANT
GLOVE OPTIFIT SS 8.0 STRL (GLOVE) ×2 IMPLANT
GLOVE SKINSENSE NS SZ8.0 LF (GLOVE) ×2
GLOVE SKINSENSE STRL SZ8.0 LF (GLOVE) ×2 IMPLANT
GLOVE SS BIOGEL STRL SZ 6.5 (GLOVE) IMPLANT
GLOVE SS BIOGEL STRL SZ 8 (GLOVE) IMPLANT
GLOVE SS N UNI LF 8.5 STRL (GLOVE) ×2 IMPLANT
GLOVE SUPERSENSE BIOGEL SZ 6.5 (GLOVE) ×1
GLOVE SUPERSENSE BIOGEL SZ 8 (GLOVE) ×1
GOWN STRL REIN XL XLG (GOWN DISPOSABLE) ×8 IMPLANT
HANDPIECE INTERPULSE COAX TIP (DISPOSABLE) ×2
HOOD W/PEELAWAY (MISCELLANEOUS) ×8 IMPLANT
INST SET MAJOR BONE (KITS) ×2 IMPLANT
IV NS IRRIG 3000ML ARTHROMATIC (IV SOLUTION) ×2 IMPLANT
KIT BLADEGUARD II DBL (SET/KITS/TRAYS/PACK) ×2 IMPLANT
KIT ROOM TURNOVER APOR (KITS) ×2 IMPLANT
MANIFOLD NEPTUNE II (INSTRUMENTS) ×2 IMPLANT
MARKER SKIN DUAL TIP RULER LAB (MISCELLANEOUS) ×2 IMPLANT
NDL HYPO 21X1.5 SAFETY (NEEDLE) ×1 IMPLANT
NEEDLE HYPO 21X1.5 SAFETY (NEEDLE) ×2 IMPLANT
NS IRRIG 1000ML POUR BTL (IV SOLUTION) ×2 IMPLANT
PACK TOTAL JOINT (CUSTOM PROCEDURE TRAY) ×2 IMPLANT
PAD ARMBOARD 7.5X6 YLW CONV (MISCELLANEOUS) ×2 IMPLANT
PAD DANNIFLEX CPM (ORTHOPEDIC SUPPLIES) ×2 IMPLANT
PIN TROCAR 3 INCH (PIN) ×2 IMPLANT
SET BASIN LINEN APH (SET/KITS/TRAYS/PACK) ×2 IMPLANT
SET HNDPC FAN SPRY TIP SCT (DISPOSABLE) ×1 IMPLANT
SPONGE DRAIN TRACH 4X4 STRL 2S (GAUZE/BANDAGES/DRESSINGS) ×1 IMPLANT
SPONGE GAUZE 4X4 12PLY (GAUZE/BANDAGES/DRESSINGS) IMPLANT
STAPLER VISISTAT 35W (STAPLE) ×2 IMPLANT
SUT BRALON NAB BRD #1 30IN (SUTURE) ×3 IMPLANT
SUT MNCRL 0 VIOLET CTX 36 (SUTURE) ×1 IMPLANT
SUT MON AB 2-0 CT1 36 (SUTURE) ×1 IMPLANT
SUT MONOCRYL 0 CTX 36 (SUTURE) ×1
SYR 20CC LL (SYRINGE) IMPLANT
SYR 30ML LL (SYRINGE) ×2 IMPLANT
SYR BULB IRRIGATION 50ML (SYRINGE) ×2 IMPLANT
TAPE CLOTH SURG 4X10 WHT LF (GAUZE/BANDAGES/DRESSINGS) ×1 IMPLANT
TOWEL OR 17X26 4PK STRL BLUE (TOWEL DISPOSABLE) ×2 IMPLANT
TOWER CARTRIDGE SMART MIX (DISPOSABLE) ×2 IMPLANT
TRAY FOLEY CATH 14FR (SET/KITS/TRAYS/PACK) ×2 IMPLANT
WATER STERILE IRR 1000ML POUR (IV SOLUTION) ×8 IMPLANT
YANKAUER SUCT 12FT TUBE ARGYLE (SUCTIONS) ×2 IMPLANT

## 2012-07-01 NOTE — Op Note (Signed)
Preop diagnosis osteoarthritis right  knee Postop diagnosis same Procedure right  total knee arthroplasty Surgeon Romeo Apple Assisted by betty ashely and wayne mcfatter  Anesthesia spinal Findings severe PTF disease , previous microfrature did not take , gr 3 chondral lesion lateral condyle , gr 2 medial condyle  Tourniquet time 73 minutes, pressure 300 mm of mercury  Indications for procedure disabling knee pain, failure to control pain with nonoperative measures  Details of procedure:  In the preop area the patient's right  knee was marked and countersigned by the surgeon, the chart was updated, consent was signed  The patient was taken to the operating room for spinal anesthetic followed by administering 2 g of Ancef based on weight of 104kg kg  A Foley catheter was inserted sterilely, then the operative extremity  was prepped and draped sterilely  The timeout was completed  The limb was then exsanguinated with a six-inch Esmarch with the knee in flexion and the tourniquet was elevated to 300 mm of mercury. A midline incision was made, the subcutaneous tissues were divided down to the extensor mechanism. A medial arthrotomy was performed, the patella was everted,  the fat pad was resected. The medial and  lateral menisci were resected. The medial soft tissue sleeve was elevated to the mid coronal plane. The anterior cruciate ligament and PCL were resected. The distal femur anterior surface was skeletonized with sharp dissection.  A three-eighths inch drill bit was used to enter the femoral canal which was suctioned and irrigated until the fluid was clear;  an intramedullary rod was placed in the femur with a 5 , right, 10 mm setting, the block was pinned in place; then 10mm distal femoral resection was performed. The cut was checked for flatness.  The sizing guide was then placed on the femur, the femur measured a size 86F; the block was pinned in external rotation 3 using the epicondyles  as reference; a  4-in-1 cutting block was placed and the 4 cuts were made with retractors protecting the collateral ligaments. The posterior osteophytes were removed with a curved osteotome. Residual PCL tissue was resected; residual meniscal tissue was resected.  The external tibial alignment instrument was set for tibial resection. The guide was   placed referencing the medial side which was the worn side and the stylus was set at 2 mm resection. Anterior slope was built in to match the patients anatomy; a neutral varus valgus cut was set using the medial third of the tibial tubercle as reference along with the medial portion of the lateral tibial spine. Theguide was stabilized with pins.  A saw was used to resect the anterior tibia. The tibia was sized to a size 2.5 base plate.  We then placed spacer blocks using a  10, 12.5, 15 spacer block;  the knee was balanced  in extension and was balanced in flexion with the 15 spacer block   The box cut was then done using the box cutting guide. We then turned our attention to the patella.  The patella measured 19 mm we set the guide to leave 14 mm of patella.  After resection of the patella,  It was remeasured, and measured 13 mm. The size was 35. We then drilled the 3 peg holes.  We then did a trial reduction. The trial reduction was excellent with full extension, balanced in extension, balance in flexion. Passive flexion equalled 120, patella normal tracking.   We then punched the tibia per technique.   The bone  was then irrigated and dried while cement was mixed on the back table. The implants were checked for accuracy and then cemented in place; excess cement was removed; the cement was allowed to cure. The wound was then irrigated with copious amounts of saline, the posterior capsule was injected with 30 cc of Marcaine with epinephrine followed by 30 cc cin the joint and in the soft tissue. The 15 insert was placed. Range of motion matched trial  reduction  The capsule was closed with #1 Bralon in interrupted and running fashion and then the joint was injected with 30 cc of Marcaine with epinephrine.  The subcutaneous tissues were closed with 0 Monocryl in running fashion  Staples were used to reapproximate the skin edges.  Sterile dressings were applied.  The patient was then taken to the recovery room in stable condition.  Routine postop plan for knee replacement.

## 2012-07-01 NOTE — Anesthesia Preprocedure Evaluation (Signed)
Anesthesia Evaluation  Patient identified by MRN, date of birth, ID band Patient awake    Reviewed: Allergy & Precautions, H&P , NPO status   History of Anesthesia Complications (+) PONV  Airway Mallampati: II    Mouth opening: Limited Mouth Opening  Dental  (+) Teeth Intact   Pulmonary COPD COPD inhaler,    + decreased breath sounds      Cardiovascular hypertension, Pt. on medications Rhythm:Regular Rate:Normal     Neuro/Psych PSYCHIATRIC DISORDERS Anxiety Depression    GI/Hepatic GERD-  Medicated and Controlled,  Endo/Other  Morbid obesity  Renal/GU      Musculoskeletal   Abdominal   Peds  Hematology   Anesthesia Other Findings   Reproductive/Obstetrics negative OB ROS                           Anesthesia Physical Anesthesia Plan  ASA: III  Anesthesia Plan: Spinal   Post-op Pain Management:    Induction:   Airway Management Planned: Nasal Cannula  Additional Equipment:   Intra-op Plan:   Post-operative Plan:   Informed Consent: I have reviewed the patients History and Physical, chart, labs and discussed the procedure including the risks, benefits and alternatives for the proposed anesthesia with the patient or authorized representative who has indicated his/her understanding and acceptance.     Plan Discussed with: CRNA  Anesthesia Plan Comments:         Anesthesia Quick Evaluation

## 2012-07-01 NOTE — Brief Op Note (Signed)
07/01/2012  9:31 AM  PATIENT:  Katelyn Thompson  47 y.o. female  PRE-OPERATIVE DIAGNOSIS:  Degenerative Arthritis of right knee  POST-OPERATIVE DIAGNOSIS:  Degenerative Arthritis of right knee  PROCEDURE:  Procedure(s): TOTAL KNEE ARTHROPLASTY (Right)  depuy 49F 2.5 T 15 INSERT/PS 35 PATELLA  SURGEON:  Surgeon(s) and Role:    * Vickki Hearing, MD - Primary  PHYSICIAN ASSISTANT:   ASSISTANTS: BETTY ASHLEY AND WAYNE MCFATTER   ANESTHESIA:   spinal  EBL:  Total I/O In: 1300 [I.V.:1300] Out: 130 [Urine:100; Blood:30]  BLOOD ADMINISTERED:none  DRAINS: 1 HEMOVAC IN THE JOINT NOT CHARGED   LOCAL MEDICATIONS USED:  MARCAINE 0.5% W/ EPI    , Amount: 90 ml  SPECIMEN:  No Specimen  DISPOSITION OF SPECIMEN:  N/A  COUNTS:  YES  TOURNIQUET:   Total Tourniquet Time Documented: Thigh (Right) - 74 minutes Total: Thigh (Right) - 74 minutes   DICTATION: .Reubin Milan Dictation  PLAN OF CARE: Admit to inpatient   PATIENT DISPOSITION:  PACU - hemodynamically stable.   Delay start of Pharmacological VTE agent (>24hrs) due to surgical blood loss or risk of bleeding: yes

## 2012-07-01 NOTE — Anesthesia Procedure Notes (Signed)
Spinal  Patient location during procedure: OR Start time: 07/01/2012 7:46 AM Staffing CRNA/Resident: Aslee Such J Preanesthetic Checklist Completed: patient identified, site marked, surgical consent, pre-op evaluation, timeout performed, IV checked, risks and benefits discussed and monitors and equipment checked Spinal Block Patient position: right lateral decubitus Prep: Betadine Patient monitoring: heart rate, cardiac monitor, continuous pulse ox and blood pressure Approach: right paramedian Location: L3-4 Injection technique: single-shot Needle Needle type: Spinocan  Needle gauge: 22 G Assessment Sensory level: T10 Additional Notes CSF clear and brisk       [id number]  03/2013

## 2012-07-01 NOTE — Interval H&P Note (Signed)
History and Physical Interval Note:  07/01/2012 7:15 AM  Katelyn Thompson  has presented today for surgery, with the diagnosis of Degenerative Arthritis of right knee  The various methods of treatment have been discussed with the patient and family. After consideration of risks, benefits and other options for treatment, the patient has consented to  Procedure(s): TOTAL KNEE ARTHROPLASTY (Right) as a surgical intervention .  The patient's history has been reviewed, patient examined, no change in status, stable for surgery.  I have reviewed the patient's chart and labs.  Questions were answered to the patient's satisfaction.     [employer]  RIGHT TKA

## 2012-07-01 NOTE — Evaluation (Signed)
Physical Therapy Evaluation Patient Details Name: Katelyn Thompson MRN: [id number] DOB: 30-Jul-1965 Today's Date: 07/01/2012 Time: 6440-3474 PT Time Calculation (min): 38 min  PT Assessment / Plan / Recommendation Clinical Impression  Pt is a 47 year old female admitted to [facility] s/p R TKR and referred to PT for impairments listed below.  At this time she requires min assit to supervision for mobilty.  Able to walk 17 feet with RW w/supervision.  Has reports of nausea, but is able to continue with mobility.    PT Assessment  Patient needs continued PT services    Follow Up Recommendations  Home health PT    Does the patient have the potential to tolerate intense rehabilitation      Barriers to Discharge        Equipment Recommendations  None recommended by PT    Recommendations for Other Services     Frequency 7X/week    Precautions / Restrictions     Pertinent Vitals/Pain Pain: 10/10 at beginning of session - reports relief at end of session.       Mobility  Bed Mobility Bed Mobility: Rolling Right;Rolling Left;Supine to Sit Rolling Right: 6: Modified independent (Device/Increase time) Rolling Left: 6: Modified independent (Device/Increase time) Supine to Sit: 4: Min assist;HOB elevated Transfers Transfers: Sit to Stand;Stand to Sit Sit to Stand: 4: Min assist;From bed;With upper extremity assist Stand to Sit: 5: Supervision;With upper extremity assist;To bed Ambulation/Gait Ambulation/Gait Assistance: 5: Supervision Ambulation Distance (Feet): 17 Feet Assistive device: Rolling walker Gait Pattern: Step-to pattern;Decreased stance time - right;Antalgic;Decreased dorsiflexion - right Gait velocity: decreased    Exercises Total Joint Exercises Ankle Circles/Pumps: AROM;Both;20 reps;Supine Quad Sets: AROM;Right;10 reps Gluteal Sets: AROM;Both;10 reps Heel Slides: AAROM;Right;10 reps;Supine;Seated Long Arc Quad: AAROM;Right;Seated;10 reps Goniometric ROM: 5-70 sitting  EOB   PT Diagnosis: Difficulty walking;Abnormality of gait;Acute pain  PT Problem List: Decreased strength;Decreased range of motion;Decreased mobility;Pain PT Treatment Interventions: Gait training;Stair training;Functional mobility training;Therapeutic activities;Therapeutic exercise;Balance training;Neuromuscular re-education;Patient/family education;Manual techniques   PT Goals Acute Rehab PT Goals PT Goal Formulation: With patient Time For Goal Achievement: 07/08/12 Potential to Achieve Goals: Good Pt will go Sit to Stand: with modified independence;with upper extremity assist PT Goal: Sit to Stand - Progress: Goal set today Pt will go Stand to Sit: with modified independence;with upper extremity assist PT Goal: Stand to Sit - Progress: Goal set today Pt will Ambulate: >150 feet;with modified independence;with least restrictive assistive device PT Goal: Ambulate - Progress: Goal set today Pt will Go Up / Down Stairs: 3-5 stairs;with min assist PT Goal: Up/Down Stairs - Progress: Goal set today Pt will Perform Home Exercise Program: with supervision, verbal cues required/provided PT Goal: Perform Home Exercise Program - Progress: Goal set today  Visit Information  Last PT Received On: 07/01/12    Subjective Data      Prior Functioning  Home Living Lives With: Spouse Available Help at Discharge: Family Type of Home: House Home Access: Stairs to enter [employer] of Steps: 3 at front, 8 at the side Entrance Stairs-Rails: Right Home Layout: One level Bathroom Shower/Tub: Health visitor: Standard Home Adaptive Equipment: Walker - rolling;Crutches;Walker - four wheeled;Straight cane;Shower chair with back;Bedside commode/3-in-1 Prior Function Level of Independence: Independent with assistive device(s) Able to Take Stairs?: Yes (step-to) Driving: Yes Vocation: On disability Comments: she takes care of her 4  grandchildren Communication Communication: No difficulties Dominant Hand: Right    Cognition       Extremity/Trunk Assessment  Balance Balance Balance Assessed: Yes Static Sitting Balance Static Sitting - Balance Support: No upper extremity supported;Feet supported Static Sitting - Level of Assistance: 7: Independent Static Standing Balance Rhomberg - Eyes Opened: 10  End of Session PT - End of Session Equipment Utilized During Treatment: Gait belt Activity Tolerance: Patient tolerated treatment well Patient left: in bed Nurse Communication: Mobility status  GP     Tashera Montalvo, PT 07/01/2012, 4:37 PM

## 2012-07-01 NOTE — Anesthesia Postprocedure Evaluation (Signed)
  Anesthesia Post-op Note  Patient: Katelyn Thompson  Procedure(s) Performed: Procedure(s): TOTAL KNEE ARTHROPLASTY (Right)  Patient Location: PACU  Anesthesia Type:Spinal  Level of Consciousness: awake, alert , oriented and patient cooperative  Airway and Oxygen Therapy: Patient Spontanous Breathing  Post-op Pain: 3 /10, mild  Post-op Assessment: Post-op Vital signs reviewed, Patient's Cardiovascular Status Stable, Respiratory Function Stable, Patent Airway, No signs of Nausea or vomiting and Pain level controlled  Post-op Vital Signs: Reviewed and stable  Complications: No apparent anesthesia complications

## 2012-07-01 NOTE — Transfer of Care (Signed)
Immediate Anesthesia Transfer of Care Note  Patient: Katelyn Thompson  Procedure(s) Performed: Procedure(s): TOTAL KNEE ARTHROPLASTY (Right)  Patient Location: PACU  Anesthesia Type:Spinal  Level of Consciousness: awake and patient cooperative  Airway & Oxygen Therapy: Patient Spontanous Breathing and Patient connected to face mask oxygen  Post-op Assessment: Report given to PACU RN and Post -op Vital signs reviewed and stable  Post vital signs: Reviewed and stable  Complications: No apparent anesthesia complications

## 2012-07-02 ENCOUNTER — Encounter (HOSPITAL_COMMUNITY): Payer: Self-pay | Admitting: Orthopedic Surgery

## 2012-07-02 LAB — BASIC METABOLIC PANEL
BUN: 5 mg/dL — ABNORMAL LOW (ref 6–23)
CO2: 29 mEq/L (ref 19–32)
Chloride: 100 mEq/L (ref 96–112)
Creatinine, Ser: 0.71 mg/dL (ref 0.50–1.10)
GFR calc Af Amer: >90 mL/min (ref >90)
Glucose, Bld: 139 mg/dL — ABNORMAL HIGH (ref 70–99)
Potassium: 4.4 mEq/L (ref 3.5–5.1)

## 2012-07-02 LAB — CBC
HCT: 38.7 % (ref 36.0–46.0)
Hemoglobin: 13.2 g/dL (ref 12.0–15.0)
MCV: 90.2 fL (ref 78.0–100.0)
RBC: 4.29 MIL/uL (ref 3.87–5.11)
WBC: 13.3 10*3/uL — ABNORMAL HIGH (ref 4.0–10.5)

## 2012-07-02 MED ORDER — WARFARIN - PHYSICIAN DOSING INPATIENT
Freq: Every day | Status: DC
  Administered 2012-07-02: 18:00:00

## 2012-07-02 MED ORDER — LINACLOTIDE 145 MCG PO CAPS
290.0000 ug | ORAL_CAPSULE | Freq: Every day | ORAL | Status: DC
  Administered 2012-07-02 – 2012-07-04 (×3): 290 ug via ORAL
  Filled 2012-07-02 (×4): qty 2

## 2012-07-02 MED ORDER — WARFARIN SODIUM 2.5 MG PO TABS
2.5000 mg | ORAL_TABLET | Freq: Every day | ORAL | Status: DC
  Administered 2012-07-02 – 2012-07-03 (×2): 2.5 mg via ORAL
  Filled 2012-07-02 (×2): qty 1

## 2012-07-02 NOTE — Progress Notes (Signed)
Notified by PT that pts O2 sats are in the upper 80's on room air. Pt placed on 2L of O2, O2 came up 94%. Sheryn Bison

## 2012-07-02 NOTE — Anesthesia Postprocedure Evaluation (Signed)
Anesthesia Post Note  Patient: Katelyn Thompson  Procedure(s) Performed: Procedure(s) (LRB): TOTAL KNEE ARTHROPLASTY (Right)  Anesthesia type: Spinal  Patient location: 341  Post pain: Pain level controlled  Post assessment: Post-op Vital signs reviewed, Patient's Cardiovascular Status Stable, Respiratory Function Stable, Patent Airway, No signs of Nausea or vomiting and Pain level controlled  Last Vitals:  Filed Vitals:   07/02/12 0300  BP: 120/81  Pulse: 97  Temp: 36.7 C  Resp: 18    Post vital signs: Reviewed and stable  Level of consciousness: awake and alert   Complications: No apparent anesthesia complications

## 2012-07-02 NOTE — Clinical Social Work Note (Signed)
Pt referred for possible SNF. PT evaluation yesterday recommending home with home health. CSW signing off but can be reconsulted if needed.  Derenda Fennel, [place] [phone]

## 2012-07-02 NOTE — Progress Notes (Signed)
Patient ID: Katelyn Thompson, female   DOB: [birth year], 47 y.o.   MRN: [id number] Postoperative day number 1  Status post left total knee arthroplasty  Vital signs BP 120/81  Pulse 97  Temp(Src) 98 F (36.7 C) (Oral)  Resp 18  Ht 5\' 4"  (1.626 m)  Wt 232 lb 6.4 oz (105.416 kg)  BMI 39.87 kg/m2  SpO2 96%   Pain level last recorded 2-8  Neurovascular exam normal  Dressing/wound scant drainage  Diagnosis osteoarthritis left knee  Plan physical therapy, CPM, weightbearing as tolerated, start Coumadin because of Tegretol contraindication, patient is noted to have esophageal disease and listed aspirin as a contraindication Continue monitor pain control seems to be more throbbing which were require more frequent ice and elevation

## 2012-07-02 NOTE — Progress Notes (Signed)
Physical Therapy Treatment Patient Details Name: Katelyn Thompson MRN: [id number] DOB: 11/02/1965 Today's Date: 07/02/2012 Time: 0815-0900 PT Time Calculation (min): 45 min  PT Assessment / Plan / Recommendation Comments on Treatment Session  Pt complains of increased pain this session as she is sore from yesterday's therapy session. Pt tolerates increased distance with gait training. AROM remains the same. Ice applied at end of session.                      Plan  (Continue per PT POC.)    Precautions / Restrictions Restrictions Weight Bearing Restrictions: Yes RLE Weight Bearing: Weight bearing as tolerated       Mobility  Bed Mobility Supine to Sit: 4: Min guard;HOB elevated Transfers Transfers: Sit to Stand;Stand to Sit Sit to Stand: 4: Min guard;From bed;Without upper extremity assist Stand to Sit: 5: Supervision;With upper extremity assist;To chair/3-in-1 Ambulation/Gait Ambulation/Gait Assistance: 5: Supervision Ambulation Distance (Feet): 25 Feet Assistive device: Rolling walker Gait Pattern: Step-to pattern;Decreased stance time - right;Antalgic;Decreased dorsiflexion - right Gait velocity: decreased    Exercises Total Joint Exercises Ankle Circles/Pumps: AROM;Both;20 reps;Supine Quad Sets: AROM;Right;10 reps Gluteal Sets: AROM;Both;10 reps Heel Slides: AAROM;Right;Supine;Seated;5 reps Long Arc Quad: AAROM;Right;Seated;10 reps Goniometric ROM: 5-70 seated     Visit Information  Last PT Received On: 07/02/12    Subjective Data  Subjective: It hurts to move my knee.         End of Session PT - End of Session Equipment Utilized During Treatment: Gait belt Activity Tolerance: Patient tolerated treatment well Patient left: in chair;with call bell/phone within reach;with chair alarm set   Seth Bake, PTA  07/02/2012, 9:17 AM

## 2012-07-02 NOTE — Progress Notes (Signed)
Physical Therapy Treatment Patient Details Name: Katelyn Thompson MRN: [id number] DOB: [birth year] Today's Date: 07/02/2012 Time: 1310-1430 PT Time Calculation (min): 80 min  PT Assessment / Plan / Recommendation Comments on Treatment Session  Pt continues to have uncontrolled pain in the R knee.  She was found in the bed with her knee in 25 degrees of flexion, so was instructed in maintaining extension at all times in the bed.  She was instructed in bridging of the knee and she only lacked 5 degrees of full knee extension.  She needs full assist to perform a SAQ and is unable to hold isometrically.  We worked on developing R knee felxion but were only able to achieve 58 degrees, AA in supine and sitting.  Severe pain (to the point of yelling out) limited much stretching by PT.  She is improving with gait endurance, however gait is still very antlagic.  She was placed in the CPM set at 0-60 degrees and she tolerated it well.  Prior to PT visit she had vicodin and dilaudid and then had robaxin during PT to try to help decrease her pain level.  2 ice packs were placed on her knee while in CPM and hopefully we can keep these refreshed.             Follow Up Recommendations        Does the patient have the potential to tolerate intense rehabilitation     Barriers to Discharge        Equipment Recommendations       Recommendations for Other Services    Frequency     Plan Discharge plan remains appropriate;Frequency remains appropriate    Precautions / Restrictions     Pertinent Vitals/Pain     Mobility  Bed Mobility Bed Mobility: Sit to Supine Supine to Sit: 4: Min assist Sit to Supine: 4: Min assist Details for Bed Mobility Assistance: Pt needs min assist to move RLE OOB today Transfers Sit to Stand: 4: Min assist;From bed Stand to Sit: 4: Min guard;To bed Details for Transfer Assistance: Pt needs cues as to placement of feet prior to standing,.  She did remember hand placement prior  to standing using teach back method Ambulation/Gait Ambulation/Gait Assistance: 5: Supervision Ambulation Distance (Feet): 50 Feet Assistive device: Rolling walker Gait Pattern: Step-to pattern;Decreased step length - right;Decreased stance time - right;Right flexed knee in stance;Decreased dorsiflexion - right Gait velocity: slow and labored Stairs: No Wheelchair Mobility Wheelchair Mobility: No    Exercises Total Joint Exercises Ankle Circles/Pumps: AROM;Both;10 reps;Supine Quad Sets: AROM;Both;10 reps;Supine Gluteal Sets: AROM;Both;10 reps;Supine Short Arc Quad: AAROM;Right;10 reps;Supine Heel Slides: AAROM;Right;10 reps;Supine Goniometric ROM: R knee = 5-58 degrees, AA supine and sitting   PT Diagnosis:    PT Problem List:   PT Treatment Interventions:     PT Goals Acute Rehab PT Goals PT Goal: Sit to Stand - Progress: Progressing toward goal PT Goal: Stand to Sit - Progress: Progressing toward goal PT Goal: Ambulate - Progress: Progressing toward goal  Visit Information  Last PT Received On: 07/02/12    Subjective Data  Subjective: My knee still hurts a lot   Cognition       Balance     End of Session PT - End of Session Equipment Utilized During Treatment: Gait belt Activity Tolerance: Patient tolerated treatment well (O2 sats in high 80s.Marland KitchenMarland KitchenRN added O2 via cannula at 2L/min) Patient left: in bed;in CPM;with call bell/phone within reach;with bed alarm set Nurse Communication:  Mobility status CPM Right Knee Right Knee Flexion (Degrees): 60 Right Knee Extension (Degrees): 0   GP     Konrad Penta 07/02/2012, 3:43 PM

## 2012-07-02 NOTE — Progress Notes (Signed)
UR Chart Review Completed  

## 2012-07-02 NOTE — Care Management Note (Signed)
    Page 1 of 2   07/04/2012     11:35:16 AM   CARE MANAGEMENT NOTE 07/04/2012  Patient:  Katelyn Thompson, Katelyn Thompson   Account Number:  [id number]  Date Initiated:  07/02/2012  Documentation initiated by:  Sharrie Rothman  Subjective/Objective Assessment:   Pt admitted from home s/p left knee surgery. Pt currently lives with her husband and will return home at discharge. Pt has a cane, walker, and BSC for home use. Pt has arranged for family to take turns coming into the home to help care for .     Action/Plan:   CM will arrange HH and CPM prior to discharge with the pt choice for [facility] agency and DME agency.   Anticipated DC Date:  07/04/2012   Anticipated DC Plan:  HOME W HOME HEALTH SERVICES      DC Planning Services  CM consult      [facility] Choice  HOME HEALTH  DURABLE MEDICAL EQUIPMENT   Choice offered to / List presented to:  C-1 Patient   DME arranged  CPM      DME agency  [facility]     HH arranged  HH-1 RN  HH-2 PT      [facility] agency  [facility]   Status of service:  Completed, signed off Medicare Important Message given?  YES (If response is "NO", the following Medicare IM given date fields will be blank) Date Medicare IM given:  07/04/2012 Date Additional Medicare IM given:    Discharge Disposition:  HOME W HOME HEALTH SERVICES  Per UR Regulation:    If discussed at Long Length of Stay Meetings, dates discussed:    Comments:  07/04/12 1130 Arlyss Queen, RN BSN CM Pt discharged home today with [facility] Rn and PT. Alroy Bailiff of [facility] is aware and will collect the pts information from the chart. CPM will be delivered to pts home from [facility] later today. HH services to start within 48 hours. Pt and pts nurse aware of discharge arrangements.  07/03/12 1425 Arlyss Queen, RN BSN CM Pt for discharge 07/04/12 with HH. Pt chose [facility] for DME and HH. Alroy Bailiff and Tula Nakayama of [facility] is aware and will collect the pts information from the chart.  07/02/12  1450 Arlyss Queen, RN BSN CM

## 2012-07-03 ENCOUNTER — Encounter (HOSPITAL_COMMUNITY): Payer: Self-pay | Admitting: Orthopedic Surgery

## 2012-07-03 LAB — CBC
HCT: 33 % — ABNORMAL LOW (ref 36.0–46.0)
Hemoglobin: 11.3 g/dL — ABNORMAL LOW (ref 12.0–15.0)
MCHC: 34.2 g/dL (ref 30.0–36.0)
MCV: 90.7 fL (ref 78.0–100.0)
RDW: 12.1 % (ref 11.5–15.5)

## 2012-07-03 LAB — PROTIME-INR: INR: 1.07 (ref 0.00–1.49)

## 2012-07-03 MED ORDER — PATIENT'S GUIDE TO USING COUMADIN BOOK
Freq: Once | Status: AC
  Administered 2012-07-03: 18:00:00
  Filled 2012-07-03: qty 1

## 2012-07-03 MED ORDER — WARFARIN VIDEO
Freq: Once | Status: AC
  Administered 2012-07-03: 18:00:00

## 2012-07-03 NOTE — Progress Notes (Signed)
Foley catheter removed without difficulty.

## 2012-07-03 NOTE — Progress Notes (Signed)
Physical Therapy Treatment Patient Details Name: DYNASTY HOLQUIN MRN: [id number] DOB: Mar 21, 1966 Today's Date: 07/03/2012 Time: 5621-3086 PT Time Calculation (min): 46 min  PT Assessment / Plan / Recommendation Comments on Treatment Session  Pt continues to c/o 8/10 pain after having pain medicine (vicodin) about 30 minutes earlier.  AA knee ROM was 5-70 degrees supine and sitting during ther ex.  We are only getting a flicker of vastus medialis during attempt at SAQ.  She is improving with transfer ability and gait endurance is improved.  We will attempt to instruct pt is stair climbing this afternoon or tomorrow AM..    Follow Up Recommendations        Does the patient have the potential to tolerate intense rehabilitation     Barriers to Discharge        Equipment Recommendations       Recommendations for Other Services    Frequency     Plan Discharge plan remains appropriate;Frequency remains appropriate    Precautions / Restrictions Restrictions Weight Bearing Restrictions: Yes RLE Weight Bearing: Weight bearing as tolerated   Pertinent Vitals/Pain     Mobility  Bed Mobility Supine to Sit: 5: Supervision Transfers Sit to Stand: 5: Supervision;From bed Stand to Sit: 5: Supervision;To chair/3-in-1 Ambulation/Gait Ambulation/Gait Assistance: 5: Supervision Ambulation Distance (Feet): 70 Feet Assistive device: Rolling walker Gait Pattern: Step-to pattern;Decreased stance time - right;Decreased step length - right;Decreased dorsiflexion - right General Gait Details: instructed pt in heel stike to foot flat gait but she was unable to perform this...she resists knee extension to achieve heel strike Stairs: No Wheelchair Mobility Wheelchair Mobility: No    Exercises Total Joint Exercises Ankle Circles/Pumps: AROM;Both;10 reps;Supine Quad Sets: AROM;Both;10 reps;Supine Short Arc Quad: AAROM;Right;10 reps;Supine Heel Slides: AAROM;Right;10 reps;Supine   PT Diagnosis:     PT Problem List:   PT Treatment Interventions:     PT Goals Acute Rehab PT Goals PT Goal: Sit to Stand - Progress: Progressing toward goal PT Goal: Stand to Sit - Progress: Progressing toward goal PT Goal: Ambulate - Progress: Progressing toward goal  Visit Information  Last PT Received On: 07/03/12    Subjective Data  Subjective: no change   Cognition       Balance     End of Session PT - End of Session Equipment Utilized During Treatment: Gait belt Activity Tolerance: Patient tolerated treatment well Patient left: in chair;with call bell/phone within reach   GP     Myrlene Broker L 07/03/2012, 12:00 PM

## 2012-07-03 NOTE — Progress Notes (Signed)
Patient ID: Katelyn Thompson, female   DOB: 1966/03/24, 47 y.o.   MRN: [id number] Postoperative day number #2  Status post left total knee arthroplasty  Vital signs BP 146/78  Pulse 138  Temp(Src) 98 F (36.7 C) (Oral)  Resp 18  Ht 5\' 4"  (1.626 m)  Wt 232 lb 6.4 oz (105.416 kg)  BMI 39.87 kg/m2  SpO2 95%   Pain level last recorded 2/10  Neurovascular exam normal  Dressing/wound clean dry and intact with Hemovac removed bloody drainage from the Hemovac site  Labs:  Hemoglobin & Hematocrit     Component Value Date/Time   HGB 11.3* 07/03/2012 0507   HCT 33.0* 07/03/2012 0507    Diagnosis  Osteoarthritis left knee status post left total knee arthroplasty   Plan continue warfarin therapy low dose nontherapeutic with 2.5 mg every night

## 2012-07-03 NOTE — Progress Notes (Signed)
Physical Therapy Treatment Patient Details Name: Katelyn Thompson MRN: [id number] DOB: 1965-08-31 Today's Date: 07/03/2012 Time: 8469-6295 PT Time Calculation (min): 49 min  PT Assessment / Plan / Recommendation Comments on Treatment Session  Pain seems to be better controlled this afternoon.  she is now independent with transfers in and out of bed, ROM of R knee is 5-70 degrees AA, she is ambulating 150' with walker and able to place R foot flat during stance.  She was placed in the CPM and flexion increased to 70 degrees.  She is not comfortable in the CPM, primarily because her leg is too short for her knee to line up with the pivot point of the CPM.  I have made her as comfortable as is possible and asked her to tolerate it as much as possible.    Follow Up Recommendations        Does the patient have the potential to tolerate intense rehabilitation     Barriers to Discharge        Equipment Recommendations       Recommendations for Other Services    Frequency     Plan Discharge plan remains appropriate;Frequency remains appropriate    Precautions / Restrictions     Pertinent Vitals/Pain     Mobility  Bed Mobility Supine to Sit: 6: Modified independent (Device/Increase time) Sit to Supine: 6: Modified independent (Device/Increase time) Transfers Sit to Stand: 6: Modified independent (Device/Increase time);From chair/3-in-1;From bed Stand to Sit: 6: Modified independent (Device/Increase time);To chair/3-in-1;To bed Ambulation/Gait Ambulation/Gait Assistance: 5: Supervision Ambulation Distance (Feet): 150 Feet Assistive device: Rolling walker Gait Pattern: Step-to pattern;Decreased stance time - right;Decreased step length - right;Decreased dorsiflexion - right General Gait Details: pt now able to place foot flat on floor during stance Stairs: No Wheelchair Mobility Wheelchair Mobility: No    Exercises Total Joint Exercises Ankle Circles/Pumps: AROM;Both;10  reps;Supine Quad Sets: AROM;Both;10 reps;Supine Short Arc Quad: AAROM;Right;10 reps;Supine (now able to hold isometrically) Heel Slides: AAROM;Right;10 reps;Supine   PT Diagnosis:    PT Problem List:   PT Treatment Interventions:     PT Goals Acute Rehab PT Goals PT Goal: Sit to Stand - Progress: Met PT Goal: Stand to Sit - Progress: Met PT Goal: Ambulate - Progress: Progressing toward goal  Visit Information  Last PT Received On: 07/03/12    Subjective Data  Subjective: no c/o   Cognition       Balance     End of Session PT - End of Session Equipment Utilized During Treatment: Gait belt Activity Tolerance: Patient tolerated treatment well Patient left: in bed;in CPM;with call bell/phone within reach Nurse Communication: Mobility status CPM Right Knee Right Knee Flexion (Degrees): 70 Right Knee Extension (Degrees): 0   GP     Myrlene Broker L 07/03/2012, 2:57 PM

## 2012-07-03 NOTE — Progress Notes (Signed)
UR Chart Review Completed  

## 2012-07-04 LAB — TYPE AND SCREEN: Unit division: 0

## 2012-07-04 LAB — CBC
HCT: 30 % — ABNORMAL LOW (ref 36.0–46.0)
Hemoglobin: 10.2 g/dL — ABNORMAL LOW (ref 12.0–15.0)
MCHC: 34 g/dL (ref 30.0–36.0)
MCV: 90.9 fL (ref 78.0–100.0)
RDW: 12.2 % (ref 11.5–15.5)

## 2012-07-04 LAB — PROTIME-INR: INR: 1.08 (ref 0.00–1.49)

## 2012-07-04 MED ORDER — WARFARIN SODIUM 2.5 MG PO TABS: 2.5000 mg | ORAL_TABLET | Freq: Every day | ORAL | Status: DC

## 2012-07-04 MED ORDER — OXYCODONE-ACETAMINOPHEN 10-325 MG PO TABS: 1.0000 | ORAL_TABLET | ORAL | Status: DC | PRN

## 2012-07-04 MED ORDER — SENNOSIDES-DOCUSATE SODIUM 8.6-50 MG PO TABS: 1.0000 | ORAL_TABLET | Freq: Every evening | ORAL | Status: AC | PRN

## 2012-07-04 NOTE — Discharge Summary (Signed)
Physician Discharge Summary  Patient ID: Katelyn Thompson MRN: [id number] DOB/AGE: 17-Jul-1965 47 y.o.  Admit date: 07/01/2012 Discharge date: 07/04/2012  Admission Diagnoses: Osteoarthritis left knee  Discharge Diagnoses: Osteoarthritis left knee Active Problems:   * No active hospital problems. *   Discharged Condition: good  Hospital Course: The patient was admitted on March 24 Cryo/Cuff did a left total knee arthroplasty tolerated that well. Her pain was controlled with hydromorphone 0.5-1 mg IV and oral Percocet 5-10 mg every 4 when necessary. She was able to tolerate physical therapy including gait training and CPM and there were no complications    Significant Diagnostic Studies: labs: hemoglobin Hemoglobin & Hematocrit     Component Value Date/Time   HGB 10.2* 07/04/2012 0504   HCT 30.0* 07/04/2012 0504    Discharge Exam: Blood pressure 148/80, pulse 116, temperature 98.3 F (36.8 C), temperature source Oral, resp. rate 16, height 5\' 4"  (1.626 m), weight 232 lb 6.4 oz (105.416 kg), SpO2 93.00%. Incision/Wound: clean dry and intact light drainage from the Hemovac site  Disposition: 01-Home or Self Care  Discharge Orders   Future Appointments Provider Department Dept Phone   07/15/2012 2:30 PM Vickki Hearing, MD [facility] [phone]   Future Orders Complete By Expires     CPM  As directed     Comments:      Continuous passive motion machine (CPM):      Use the CPM from 0 to 70 for 6 hours per day.      You may increase by 10 per day.  You may break it up into 2 or 3 sessions per day.      Use CPM for 3 weeks or until you are told to stop.    Call MD / Call 911  As directed     Comments:      If you experience chest pain or shortness of breath, CALL 911 and be transported to the hospital emergency room.  If you develope a fever above 101 F, pus (white drainage) or increased drainage or redness at the wound, or calf pain, call your  surgeon's office.    Change dressing  As directed     Comments:      Change dressing on fri, then change the dressing daily with sterile 4 x 4 inch gauze dressing and apply TED hose.  You may clean the incision with alcohol prior to redressing.    Constipation Prevention  As directed     Comments:      Drink plenty of fluids.  Prune juice may be helpful.  You may use a stool softener, such as Colace (over the counter) 100 mg twice a day.  Use MiraLax (over the counter) for constipation as needed.    Diet - low sodium heart healthy  As directed     Do not put a pillow under the knee. Place it under the heel.  As directed     Driving restrictions  As directed     Comments:      No driving for 2 weeks    Face-to-face encounter (required for Medicare/Medicaid patients)  As directed     Comments:      I Fuller Canada certify that this patient is under my care and that I, or a nurse practitioner or physician's assistant working with me, had a face-to-face encounter that meets the physician face-to-face encounter requirements with this patient on 07/04/2012. The encounter with the patient was in  whole, or in part for the following medical condition(s) which is the primary reason for home health care (List medical condition): oa left knee , knee replacement    Questions:      The encounter with the patient was in whole, or in part, for the following medical condition, which is the primary reason for home health care:  knee replacement    I certify that, based on my findings, the following services are medically necessary home health services:  Physical therapy    Nursing    My clinical findings support the need for the above services:  Unable to leave home safely without assistance and/or assistive device    Further, I certify that my clinical findings support that this patient is homebound due to:  Ambulates short distances less than 300 feet    Reason for Medically Necessary Home Health Services:   Therapy- Investment banker, operational, Patent examiner    Home Health  As directed     Questions:      To provide the following care/treatments:  PT    RN    Increase activity slowly as tolerated  As directed     TED hose  As directed     Comments:      Use stockings (TED hose) for 6 weeks on both leg(s).  You may remove them at night for sleeping.        Medication List    STOP taking these medications       acetaminophen 500 MG tablet  Commonly known as:  TYLENOL     HYDROcodone-acetaminophen 5-325 MG per tablet  Commonly known as:  NORCO/VICODIN     ZITHROMAX Z-PAK 250 MG tablet  Generic drug:  azithromycin      TAKE these medications       ADVANCED FIBER COMPLEX PO  Take 2 capsules by mouth daily.     ALPRAZolam 1 MG tablet  Commonly known as:  XANAX  Take 1 mg by mouth 3 (three) times daily as needed. For anxiety     amitriptyline 25 MG tablet  Commonly known as:  ELAVIL  Take 25 mg by mouth at bedtime.     amLODipine 5 MG tablet  Commonly known as:  NORVASC  Take 5 mg by mouth every morning.     BLACK COHOSH PO  Take 175 mg by mouth 3 (three) times daily.     carbamazepine 100 MG chewable tablet  Commonly known as:  TEGRETOL  Chew 100 mg by mouth daily.     cyclobenzaprine 10 MG tablet  Commonly known as:  FLEXERIL  Take 1 tablet (10 mg total) by mouth 3 (three) times daily as needed. Muscle Spasms     DALIRESP 500 MCG Tabs tablet  Generic drug:  roflumilast  Take 500 mcg by mouth every morning.     dexlansoprazole 60 MG capsule  Commonly known as:  DEXILANT  Take 1 capsule (60 mg total) by mouth daily.     dextromethorphan-guaiFENesin 30-600 MG per 12 hr tablet  Commonly known as:  MUCINEX DM  Take 1 tablet by mouth every 12 (twelve) hours.     DULERA 200-5 MCG/ACT Aero  Generic drug:  mometasone-formoterol  Inhale 2 puffs into the lungs 2 (two) times daily.     ergocalciferol 50000 UNITS capsule  Commonly known as:  VITAMIN D2   Take 50,000 Units by mouth once a week. On fridays     gabapentin 800 MG tablet  Commonly known as:  NEURONTIN  Take 800 mg by mouth 3 (three) times daily.     lamoTRIgine 100 MG tablet  Commonly known as:  LAMICTAL  Take 100 mg by mouth every morning.     Linaclotide 290 MCG Caps  Take 1 capsule by mouth daily. 30 minutes before a meal     lisinopril 10 MG tablet  Commonly known as:  PRINIVIL,ZESTRIL  Take 10 mg by mouth every morning.     mupirocin ointment 2 %  Commonly known as:  BACTROBAN  Apply 1 application topically 3 (three) times daily. Use for 7-10 days to affected area of Lip     oxyCODONE-acetaminophen 10-325 MG per tablet  Commonly known as:  PERCOCET  Take 1 tablet by mouth every 4 (four) hours as needed for pain.     pantoprazole 40 MG tablet  Commonly known as:  PROTONIX  Take 1 tablet (40 mg total) by mouth 2 (two) times daily.     promethazine 12.5 MG tablet  Commonly known as:  PHENERGAN  Take 1 tablet (12.5 mg total) by mouth every 6 (six) hours as needed for nausea.     PROVENTIL (2.5 MG/3ML) 0.083% nebulizer solution  Generic drug:  albuterol  Take 2.5 mg by nebulization every 6 (six) hours as needed. For shortness of breath     senna-docusate 8.6-50 MG per tablet  Commonly known as:  Senokot-S  Take 1 tablet by mouth at bedtime as needed.     SEROQUEL XR 50 MG Tb24  Generic drug:  QUEtiapine  Take 50 mg by mouth at bedtime.     traZODone 150 MG tablet  Commonly known as:  DESYREL  Take 300 mg by mouth at bedtime.     warfarin 2.5 MG tablet  Commonly known as:  COUMADIN  Take 1 tablet (2.5 mg total) by mouth daily at 6 PM.           Follow-up Information   Follow up with 2407.      Follow up with Fuller Canada, MD On 07/15/2012.   Contact information:   [address] [place] [address] [phone]       Signed: Fuller Canada 07/04/2012, 7:08 AM

## 2012-07-04 NOTE — Progress Notes (Signed)
Discharge instructions and prescriptions given, verbalized understanding, out in stable condition via w/c with staff. 

## 2012-07-15 ENCOUNTER — Telehealth: Payer: Self-pay | Admitting: Orthopedic Surgery

## 2012-07-15 ENCOUNTER — Encounter: Payer: Self-pay | Admitting: Orthopedic Surgery

## 2012-07-15 ENCOUNTER — Ambulatory Visit (INDEPENDENT_AMBULATORY_CARE_PROVIDER_SITE_OTHER): Payer: Medicare Other | Admitting: Orthopedic Surgery

## 2012-07-15 VITALS — BP 120/70 | Ht 64.0 in | Wt 220.0 lb

## 2012-07-15 DIAGNOSIS — Z96651 Presence of right artificial knee joint: Secondary | ICD-10-CM

## 2012-07-15 DIAGNOSIS — Z96659 Presence of unspecified artificial knee joint: Secondary | ICD-10-CM

## 2012-07-15 MED ORDER — PREDNISONE 10 MG PO KIT: 10.0000 mg | PACK | ORAL | Status: DC

## 2012-07-15 MED ORDER — OXYCODONE-ACETAMINOPHEN 10-325 MG PO TABS: 1.0000 | ORAL_TABLET | ORAL | Status: DC | PRN

## 2012-07-15 NOTE — Telephone Encounter (Signed)
Yes

## 2012-07-15 NOTE — Telephone Encounter (Signed)
Katelyn Thompson asked if she can get her knee wet now that the staples are out?  Her # [phone]

## 2012-07-15 NOTE — Progress Notes (Signed)
Patient ID: Katelyn Thompson, female   DOB: [birth year], 47 y.o.   MRN: [id number] Chief Complaint  Patient presents with  . Follow-up    Post op 1 Right TKA DOS 07/01/12   Postop visit status post total knee arthroplasty  Diagnosis  Osteoarthritis  Implant Depuy  posterior stabilized total knee replacement DVT prophylaxis Ecotrin twice a day with TED hose for 6 weeks Living situation home  Complaints none  Exam normal incision calf soft  Plan out patient PT  Refill pain medication

## 2012-07-15 NOTE — Patient Instructions (Addendum)
Set up Physical Therapy.

## 2012-07-15 NOTE — Telephone Encounter (Signed)
Advised  patient of doctor's reply °

## 2012-07-23 ENCOUNTER — Ambulatory Visit: Payer: Medicare Other | Attending: Orthopedic Surgery | Admitting: Physical Therapy

## 2012-07-23 DIAGNOSIS — R5381 Other malaise: Secondary | ICD-10-CM | POA: Insufficient documentation

## 2012-07-23 DIAGNOSIS — M25669 Stiffness of unspecified knee, not elsewhere classified: Secondary | ICD-10-CM | POA: Insufficient documentation

## 2012-07-23 DIAGNOSIS — Z96659 Presence of unspecified artificial knee joint: Secondary | ICD-10-CM | POA: Insufficient documentation

## 2012-07-23 DIAGNOSIS — M25569 Pain in unspecified knee: Secondary | ICD-10-CM | POA: Insufficient documentation

## 2012-07-23 DIAGNOSIS — R269 Unspecified abnormalities of gait and mobility: Secondary | ICD-10-CM | POA: Insufficient documentation

## 2012-07-25 ENCOUNTER — Ambulatory Visit: Payer: Medicare Other | Admitting: Physical Therapy

## 2012-07-30 ENCOUNTER — Ambulatory Visit: Payer: Medicare Other | Admitting: Physical Therapy

## 2012-08-01 ENCOUNTER — Ambulatory Visit: Payer: Medicare Other | Admitting: Physical Therapy

## 2012-08-05 ENCOUNTER — Other Ambulatory Visit: Payer: Self-pay | Admitting: Orthopedic Surgery

## 2012-08-05 ENCOUNTER — Telehealth: Payer: Self-pay | Admitting: Orthopedic Surgery

## 2012-08-05 DIAGNOSIS — Z96659 Presence of unspecified artificial knee joint: Secondary | ICD-10-CM

## 2012-08-05 MED ORDER — OXYCODONE-ACETAMINOPHEN 7.5-325 MG PO TABS: 1.0000 | ORAL_TABLET | ORAL | Status: DC | PRN

## 2012-08-05 NOTE — Telephone Encounter (Signed)
Script ready.

## 2012-08-05 NOTE — Telephone Encounter (Signed)
Katelyn Thompson is asking for a new prescription for Percocet, says she only has 3 days left Her # [phone]

## 2012-08-06 ENCOUNTER — Ambulatory Visit: Payer: Medicare Other | Admitting: Physical Therapy

## 2012-08-08 ENCOUNTER — Ambulatory Visit: Payer: Medicare Other | Attending: Orthopedic Surgery | Admitting: Physical Therapy

## 2012-08-08 DIAGNOSIS — Z96659 Presence of unspecified artificial knee joint: Secondary | ICD-10-CM | POA: Insufficient documentation

## 2012-08-08 DIAGNOSIS — M25569 Pain in unspecified knee: Secondary | ICD-10-CM | POA: Insufficient documentation

## 2012-08-08 DIAGNOSIS — R5381 Other malaise: Secondary | ICD-10-CM | POA: Insufficient documentation

## 2012-08-08 DIAGNOSIS — M25669 Stiffness of unspecified knee, not elsewhere classified: Secondary | ICD-10-CM | POA: Insufficient documentation

## 2012-08-08 DIAGNOSIS — R269 Unspecified abnormalities of gait and mobility: Secondary | ICD-10-CM | POA: Insufficient documentation

## 2012-08-13 ENCOUNTER — Ambulatory Visit (INDEPENDENT_AMBULATORY_CARE_PROVIDER_SITE_OTHER): Payer: Medicare Other | Admitting: Orthopedic Surgery

## 2012-08-13 ENCOUNTER — Ambulatory Visit: Payer: Medicare Other | Admitting: Physical Therapy

## 2012-08-13 ENCOUNTER — Encounter: Payer: Self-pay | Admitting: Orthopedic Surgery

## 2012-08-13 VITALS — BP 140/78 | Ht 64.0 in | Wt 220.0 lb

## 2012-08-13 DIAGNOSIS — Z96659 Presence of unspecified artificial knee joint: Secondary | ICD-10-CM | POA: Insufficient documentation

## 2012-08-13 DIAGNOSIS — Z96651 Presence of right artificial knee joint: Secondary | ICD-10-CM

## 2012-08-13 MED ORDER — OXYCODONE-ACETAMINOPHEN 5-325 MG PO TABS: 1.0000 | ORAL_TABLET | ORAL | Status: DC | PRN

## 2012-08-13 NOTE — Patient Instructions (Addendum)
Continue therapy

## 2012-08-13 NOTE — Progress Notes (Signed)
Patient ID: Katelyn Thompson, female   DOB: January 17, 1966, 47 y.o.   MRN: [id number] Chief Complaint  Patient presents with  . Follow-up    Post op 2 Right TKA DOS 07/01/12    Six-week followup knee replacement on the right current range of motion 10-95 I was able to get her to 5-100 passive range of motion  She is progressing slowly  Current medication Percocet 7.5 decreasing to 5 mg a day  I told her if she doesn't improve over the next 4 weeks I recommend manipulation  She was still using a crutch not sure why I've asked her to eliminate that from her rehabilitation

## 2012-08-15 ENCOUNTER — Ambulatory Visit: Payer: Medicare Other | Admitting: Physical Therapy

## 2012-08-20 ENCOUNTER — Other Ambulatory Visit: Payer: Self-pay | Admitting: Orthopedic Surgery

## 2012-08-20 ENCOUNTER — Telehealth: Payer: Self-pay | Admitting: Orthopedic Surgery

## 2012-08-20 ENCOUNTER — Ambulatory Visit: Payer: Medicare Other | Admitting: Physical Therapy

## 2012-08-20 MED ORDER — MORPHINE SULFATE ER 30 MG PO TBCR: 30.0000 mg | EXTENDED_RELEASE_TABLET | Freq: Two times a day (BID) | ORAL | Status: DC

## 2012-08-20 NOTE — Telephone Encounter (Signed)
NO MED CHANGES  BUT I WILL ADD A NEW MEDICATION

## 2012-08-20 NOTE — Telephone Encounter (Signed)
Called patient, she will pick up prescription Wednesday 08/21/12

## 2012-08-20 NOTE — Telephone Encounter (Signed)
SHE WILL HAVE TO PICK UP NEW SCRIPT AT OFFICE

## 2012-08-20 NOTE — Telephone Encounter (Signed)
Katelyn Thompson says since you decreased the strength of the Oxycodone, she has been in pain about all the time, and is not able to sleep because of the Pain.  She says she need something to stop the pain.  Uses [facility] in [place].  Her # [phone]

## 2012-08-22 ENCOUNTER — Ambulatory Visit: Payer: Medicare Other | Admitting: Physical Therapy

## 2012-08-27 ENCOUNTER — Ambulatory Visit: Payer: Medicare Other | Admitting: Physical Therapy

## 2012-08-29 ENCOUNTER — Ambulatory Visit: Payer: Medicare Other | Admitting: Physical Therapy

## 2012-09-04 ENCOUNTER — Ambulatory Visit: Payer: Medicare Other | Admitting: Physical Therapy

## 2012-09-05 ENCOUNTER — Ambulatory Visit: Payer: Medicare Other | Admitting: Physical Therapy

## 2012-09-10 ENCOUNTER — Ambulatory Visit: Payer: Medicare Other | Attending: Orthopedic Surgery | Admitting: Physical Therapy

## 2012-09-10 DIAGNOSIS — R5381 Other malaise: Secondary | ICD-10-CM | POA: Insufficient documentation

## 2012-09-10 DIAGNOSIS — Z96659 Presence of unspecified artificial knee joint: Secondary | ICD-10-CM | POA: Insufficient documentation

## 2012-09-10 DIAGNOSIS — R269 Unspecified abnormalities of gait and mobility: Secondary | ICD-10-CM | POA: Insufficient documentation

## 2012-09-10 DIAGNOSIS — M25569 Pain in unspecified knee: Secondary | ICD-10-CM | POA: Insufficient documentation

## 2012-09-10 DIAGNOSIS — M25669 Stiffness of unspecified knee, not elsewhere classified: Secondary | ICD-10-CM | POA: Insufficient documentation

## 2012-09-12 ENCOUNTER — Other Ambulatory Visit: Payer: Self-pay | Admitting: *Deleted

## 2012-09-12 ENCOUNTER — Ambulatory Visit: Payer: Medicare Other | Admitting: Physical Therapy

## 2012-09-12 ENCOUNTER — Ambulatory Visit (INDEPENDENT_AMBULATORY_CARE_PROVIDER_SITE_OTHER): Payer: Medicare Other | Admitting: Orthopedic Surgery

## 2012-09-12 VITALS — BP 102/64 | Ht 64.0 in | Wt 220.0 lb

## 2012-09-12 DIAGNOSIS — M25669 Stiffness of unspecified knee, not elsewhere classified: Secondary | ICD-10-CM

## 2012-09-12 DIAGNOSIS — M25661 Stiffness of right knee, not elsewhere classified: Secondary | ICD-10-CM

## 2012-09-12 DIAGNOSIS — Z96659 Presence of unspecified artificial knee joint: Secondary | ICD-10-CM

## 2012-09-12 DIAGNOSIS — Z96651 Presence of right artificial knee joint: Secondary | ICD-10-CM

## 2012-09-12 NOTE — Patient Instructions (Signed)
Manipulation right knee sugery

## 2012-09-13 ENCOUNTER — Encounter: Payer: Self-pay | Admitting: Orthopedic Surgery

## 2012-09-13 ENCOUNTER — Telehealth: Payer: Self-pay | Admitting: Orthopedic Surgery

## 2012-09-13 ENCOUNTER — Encounter (HOSPITAL_COMMUNITY): Payer: Self-pay

## 2012-09-13 ENCOUNTER — Encounter (HOSPITAL_COMMUNITY)
Admission: RE | Admit: 2012-09-13 | Discharge: 2012-09-13 | Disposition: A | Discharge status: Home / Self Care | Payer: Medicare Other | Source: Ambulatory Visit | Attending: Orthopedic Surgery | Admitting: Orthopedic Surgery

## 2012-09-13 DIAGNOSIS — M25669 Stiffness of unspecified knee, not elsewhere classified: Secondary | ICD-10-CM | POA: Insufficient documentation

## 2012-09-13 LAB — HEMOGLOBIN AND HEMATOCRIT, BLOOD
HCT: 39.2 % (ref 36.0–46.0)
Hemoglobin: 13.5 g/dL (ref 12.0–15.0)

## 2012-09-13 LAB — BASIC METABOLIC PANEL
BUN: 5 mg/dL — ABNORMAL LOW (ref 6–23)
Creatinine, Ser: 0.64 mg/dL (ref 0.50–1.10)
GFR calc Af Amer: >90 mL/min (ref >90)
GFR calc non Af Amer: >90 mL/min (ref >90)

## 2012-09-13 NOTE — Telephone Encounter (Signed)
Spoke with Rowland Lathe and gave her the pre-op time of 2:15 today 6/6/1, her surgery will be 09/16/12 per Beryle Quant

## 2012-09-13 NOTE — Progress Notes (Signed)
Patient ID: Katelyn Thompson, female   DOB: 11-30-1965, 47 y.o.   MRN: [id number] Chief Complaint  Patient presents with  . Follow-up    4 week recheck right TKA DOS 07/01/12   BP 102/64  Ht 5\' 4"  (1.626 m)  Wt 220 lb (99.791 kg)  BMI 37.74 kg/m2 Encounter Diagnoses  Name Primary?  . S/P total knee replacement, right Yes  . Joint stiffness of knee, right    The patient had a knee replacement on the right she has approximately 80 of knee flexion and 5 flexion contracture. I think we can manipulate his knee and it back to the 120 that she hadn't left the OR. The patient still complains of pain there is some concern that she may have some neuropathy going on in the right leg.  She understands the risks of the procedure including fracture of the limb  We will proceed with manipulation under anesthesia

## 2012-09-13 NOTE — Patient Instructions (Addendum)
    Katelyn Thompson  09/13/2012   Your procedure is scheduled on:  09/16/2012  Report to [facility] at  1000  AM.  Call this number if you have problems the morning of surgery: [phone]   Remember:   Do not eat food or drink liquids after midnight.   Take these medicines the morning of surgery with A SIP OF WATER: xanax,elavil,norvasc,tegretol, flexaril, dexilant, mucinex, neurontin, lamictil, lisinopril, MS Contin or  Percocet, protonix, prednisone, phenergan, daliresp. Take your proventil nebulizer and your [facility] before you come.   Do not wear jewelry, make-up or nail polish.  Do not wear lotions, powders, or perfumes.  Do not shave 48 hours prior to surgery. Men may shave face and neck.  Do not bring valuables to the hospital.  [facility] is not responsible for any belongings or valuables.  Contacts, dentures or bridgework may not be worn into surgery.  Leave suitcase in the car. After surgery it may be brought to your room.  For patients admitted to the hospital, checkout time is 11:00 AM the day of discharge.   Patients discharged the day of surgery will not be allowed to drive  home.  Name and phone number of your driver: family  Special Instructions: Shower using CHG 2 nights before surgery and the night before surgery.  If you shower the day of surgery use CHG.  Use special wash - you have one bottle of CHG for all showers.  You should use approximately 1/3 of the bottle for each shower.   Please read over the following fact sheets that you were given: Pain Booklet, Coughing and Deep Breathing, MRSA Information, Surgical Site Infection Prevention, Anesthesia Post-op Instructions and Care and Recovery After Surgery PATIENT INSTRUCTIONS POST-ANESTHESIA  IMMEDIATELY FOLLOWING SURGERY:  Do not drive or operate machinery for the first twenty four hours after surgery.  Do not make any important decisions for twenty four hours after surgery or while taking narcotic pain medications or  sedatives.  If you develop intractable nausea and vomiting or a severe headache please notify your doctor immediately.  FOLLOW-UP:  Please make an appointment with your surgeon as instructed. You do not need to follow up with anesthesia unless specifically instructed to do so.  WOUND CARE INSTRUCTIONS (if applicable):  Keep a dry clean dressing on the anesthesia/puncture wound site if there is drainage.  Once the wound has quit draining you may leave it open to air.  Generally you should leave the bandage intact for twenty four hours unless there is drainage.  If the epidural site drains for more than 36-48 hours please call the anesthesia department.  QUESTIONS?:  Please feel free to call your physician or the hospital operator if you have any questions, and they will be happy to assist you.

## 2012-09-13 NOTE — Telephone Encounter (Signed)
Regarding surgery scheduled 09/16/12, CPT 27570, ICD-9, 996.77, called insurer, [employer] at [employer] [phone] on 09/12/12; Per [employer], national intake representative,  no pre-authorization required for this procedure for out-patient/observation basis for [employer].  Her name and date 09/12/12 for reference.

## 2012-09-15 NOTE — H&P (Signed)
Katelyn Thompson is an 47 y.o. female.   Chief Complaint: stiff pain right knee  HPI: s/p rt tka uncomplicated, 120 degrees on the table at surgery, poor participation in the initial phase of therapy. She is not progressing in rehab. I recommended MUA to improve ROM  She does complain of continuede pain but exhibits no signs of infection.   Past Medical History  Diagnosis Date  . GERD (gastroesophageal reflux disease)   . COPD (chronic obstructive pulmonary disease)   . HTN (hypertension)   . Nerve damage     Severe to left foot  . Bipolar 1 disorder   . Anxiety and depression     Does not like being around lots of people  . Hypercholesteremia   . MS (multiple sclerosis)     but not offcially disgnosed  . Barrett's esophagus without dysplasia   . Arthritis   . PONV (postoperative nausea and vomiting)   . Depression   . Anxiety   . OA (osteoarthritis) of knee 01/31/2012    Past Surgical History  Procedure Laterality Date  . S/p hysterectomy    . Tubal ligation    . Sinus surgery with instatrak    . Foot surgery      x 2  . Esophagogastroduodenoscopy  06/2010    diagnosed with Barrett's, small hh, esophagus dilated. Next EGD 06/2011  . Colonoscopy  11/20/2011    RMR: Friable anorectum; single anal canal hemorrhoidal tag. Otherwise normal rectum and colon  . Surgery of lip      biopsy  . Ankle arthroscopy with drilling/microfracture  02/26/2012    Procedure: ANKLE ARTHROSCOPY WITH DRILLING/MICROFRACTURE;  Surgeon: Vickki Hearing, MD;  Location: [facility];  Service: Orthopedics;  Laterality: Right;  . Knee arthroscopy  02/26/2012    Procedure: ARTHROSCOPY KNEE;  Surgeon: Vickki Hearing, MD;  Location: [facility];  Service: Orthopedics;  Laterality: Right;  with three chondroplasties  . Esophagogastroduodenoscopy  Aug 2013    RMR: abnormal distal esophagus consistent with prior dx of SS Barrett;s. s/p dilation. small hiatal hernia. Biopsies: no metaplasia, dysplasia, malignancy.  SURVEILLANCE AUG 2016  . Abdominal hysterectomy    . Total knee arthroplasty Right 07/01/2012    Procedure: TOTAL KNEE ARTHROPLASTY;  Surgeon: Vickki Hearing, MD;  Location: [facility];  Service: Orthopedics;  Laterality: Right;    Family History  Problem Relation Age of Onset  . Colon cancer Neg Hx   . Heart disease Mother   . Heart attack Brother 48    deceased  . Diabetes Mother   . Asthma Mother   . Lung disease Mother   . Arthritis Mother   . Allergies Mother    Social History:  reports that she quit smoking about a year ago. Her smoking use included Cigarettes. She has a 68 pack-year smoking history. She has never used smokeless tobacco. She reports that  drinks alcohol. She reports that she does not use illicit drugs.  Allergies:  Allergies  Allergen Reactions  . Aspirin Other (See Comments)    esophagus disease  . Nitrofurantoin Nausea And Vomiting  . Relafen (Nabumetone) Nausea And Vomiting    No prescriptions prior to admission    Results for orders placed during the hospital encounter of 09/13/12 (from the past 48 hour(s))  SURGICAL PCR SCREEN     Status: None   Collection Time    09/13/12  2:18 PM      Result Value Range   MRSA, PCR NEGATIVE  NEGATIVE   Staphylococcus aureus NEGATIVE  NEGATIVE   Comment:            The Xpert SA Assay (FDA     approved for NASAL specimens     in patients over 68 years of age),     is one component of     a comprehensive surveillance     program.  Test performance has     been validated by [employer] for patients greater     than or equal to 60 year old.     It is not intended     to diagnose infection nor to     guide or monitor treatment.  HEMOGLOBIN AND HEMATOCRIT, BLOOD     Status: None   Collection Time    09/13/12  2:58 PM      Result Value Range   Hemoglobin 13.5  12.0 - 15.0 g/dL   HCT 29.5  62.1 - 30.8 %  BASIC METABOLIC PANEL     Status: Abnormal   Collection Time    09/13/12  2:58 PM      Result  Value Range   Sodium 140  135 - 145 mEq/L   Potassium 4.0  3.5 - 5.1 mEq/L   Chloride 101  96 - 112 mEq/L   CO2 28  19 - 32 mEq/L   Glucose, Bld 104 (*) 70 - 99 mg/dL   BUN 5 (*) 6 - 23 mg/dL   Creatinine, Ser 6.57  0.50 - 1.10 mg/dL   Calcium 9.8  8.4 - 84.6 mg/dL   GFR calc non Af Amer >90  >90 mL/min   GFR calc Af Amer >90  >90 mL/min   Comment:            The eGFR has been calculated     using the CKD EPI equation.     This calculation has not been     validated in all clinical     situations.     eGFR's persistently     <90 mL/min signify     possible Chronic Kidney Disease.   No results found.  Review of Systems  Musculoskeletal: Positive for joint pain.  All other systems reviewed and are negative.    There were no vitals taken for this visit. Physical Exam  Constitutional: Vital signs are normal. She appears well-developed and well-nourished. She is cooperative.  Non-toxic appearance. She does not have a sickly appearance. She does not appear ill. No distress.  HENT:  Head: Normocephalic and atraumatic.  Eyes: EOM are normal. Right conjunctiva is not injected. Right conjunctiva has no hemorrhage. Left conjunctiva is not injected. Left conjunctiva has no hemorrhage.  Neck: Trachea normal, normal range of motion and full passive range of motion without pain. Neck supple. No spinous process tenderness present. No mass and no thyromegaly present.  Cardiovascular: Regular rhythm and intact distal pulses.   Respiratory: Effort normal. No respiratory distress.  GI: Soft. Normal appearance.  Musculoskeletal:       Right shoulder: Normal.       Left shoulder: Normal.       Right knee: She exhibits decreased range of motion. She exhibits no swelling, no effusion, no ecchymosis, no deformity, no laceration, no erythema, normal alignment, no LCL laxity, normal patellar mobility, normal meniscus and no MCL laxity. Tenderness found. No MCL, no LCL and no patellar tendon tenderness  noted.       Left knee: She exhibits  decreased range of motion, swelling and effusion. She exhibits no deformity, no laceration, no erythema, normal alignment, no LCL laxity, normal patellar mobility, no bony tenderness, normal meniscus and no MCL laxity. Tenderness found. Medial joint line tenderness noted. No lateral joint line, no MCL, no LCL and no patellar tendon tenderness noted.       Right upper arm: Normal.       Left upper arm: Normal.       Legs: Lymphadenopathy:       Right: No inguinal adenopathy present.       Left: No inguinal adenopathy present.  Neurological: She is alert. She has normal strength. She displays no atrophy and no tremor. No cranial nerve deficit or sensory deficit. She exhibits normal muscle tone. Gait abnormal.  Skin: Skin is warm, dry and intact. No rash noted.  Psychiatric: She has a normal mood and affect. Her speech is normal and behavior is normal. Judgment and thought content normal. Cognition and memory are normal.     Assessment/Plan Stiff tka after rehab, not progressing She did not participate well in therapy initially and resulted in stiffness MUA RT KNEE   Fuller Canada 09/15/2012, 11:13 AM

## 2012-09-16 ENCOUNTER — Observation Stay (HOSPITAL_COMMUNITY)
Admission: RE | Admit: 2012-09-16 | Discharge: 2012-09-18 | Disposition: A | Discharge status: Home Health | Payer: Medicare Other | Source: Ambulatory Visit | Attending: Orthopedic Surgery | Admitting: Orthopedic Surgery

## 2012-09-16 ENCOUNTER — Encounter (HOSPITAL_COMMUNITY): Payer: Self-pay | Admitting: Anesthesiology

## 2012-09-16 ENCOUNTER — Encounter (HOSPITAL_COMMUNITY): Payer: Self-pay | Admitting: *Deleted

## 2012-09-16 ENCOUNTER — Encounter (HOSPITAL_COMMUNITY)
Admission: RE | Disposition: A | Discharge status: Home Health | Payer: Self-pay | Source: Ambulatory Visit | Attending: Orthopedic Surgery

## 2012-09-16 ENCOUNTER — Ambulatory Visit (HOSPITAL_COMMUNITY): Payer: Medicare Other | Admitting: Anesthesiology

## 2012-09-16 DIAGNOSIS — M25661 Stiffness of right knee, not elsewhere classified: Secondary | ICD-10-CM

## 2012-09-16 DIAGNOSIS — I10 Essential (primary) hypertension: Secondary | ICD-10-CM | POA: Insufficient documentation

## 2012-09-16 DIAGNOSIS — J4489 Other specified chronic obstructive pulmonary disease: Secondary | ICD-10-CM | POA: Insufficient documentation

## 2012-09-16 DIAGNOSIS — M25669 Stiffness of unspecified knee, not elsewhere classified: Secondary | ICD-10-CM

## 2012-09-16 DIAGNOSIS — J449 Chronic obstructive pulmonary disease, unspecified: Secondary | ICD-10-CM | POA: Insufficient documentation

## 2012-09-16 DIAGNOSIS — Z01812 Encounter for preprocedural laboratory examination: Secondary | ICD-10-CM | POA: Insufficient documentation

## 2012-09-16 DIAGNOSIS — Z96651 Presence of right artificial knee joint: Secondary | ICD-10-CM

## 2012-09-16 DIAGNOSIS — Z79899 Other long term (current) drug therapy: Secondary | ICD-10-CM | POA: Insufficient documentation

## 2012-09-16 DIAGNOSIS — T84498A Other mechanical complication of other internal orthopedic devices, implants and grafts, initial encounter: Principal | ICD-10-CM | POA: Insufficient documentation

## 2012-09-16 DIAGNOSIS — Y831 Surgical operation with implant of artificial internal device as the cause of abnormal reaction of the patient, or of later complication, without mention of misadventure at the time of the procedure: Secondary | ICD-10-CM | POA: Insufficient documentation

## 2012-09-16 DIAGNOSIS — Z96659 Presence of unspecified artificial knee joint: Secondary | ICD-10-CM | POA: Insufficient documentation

## 2012-09-16 HISTORY — PX: EXAM UNDER ANESTHESIA WITH MANIPULATION OF KNEE: SHX5816

## 2012-09-16 SURGERY — MANIPULATION, JOINT, KNEE, WITH ANESTHESIA
Anesthesia: General | Site: Knee | Laterality: Right

## 2012-09-16 MED ORDER — MUPIROCIN 2 % EX OINT: 1.0000 "application " | TOPICAL_OINTMENT | Freq: Three times a day (TID) | CUTANEOUS | Status: DC

## 2012-09-16 MED ORDER — MORPHINE SULFATE ER 30 MG PO TBCR
30.0000 mg | EXTENDED_RELEASE_TABLET | Freq: Two times a day (BID) | ORAL | Status: DC
  Administered 2012-09-16 – 2012-09-18 (×4): 30 mg via ORAL
  Filled 2012-09-16 (×4): qty 1

## 2012-09-16 MED ORDER — KETOROLAC TROMETHAMINE 30 MG/ML IJ SOLN
15.0000 mg | Freq: Four times a day (QID) | INTRAMUSCULAR | Status: AC
  Administered 2012-09-16 – 2012-09-17 (×4): 15 mg via INTRAVENOUS
  Filled 2012-09-16 (×8): qty 1

## 2012-09-16 MED ORDER — ASPIRIN EC 325 MG PO TBEC
325.0000 mg | DELAYED_RELEASE_TABLET | Freq: Every day | ORAL | Status: DC
  Administered 2012-09-17 – 2012-09-18 (×2): 325 mg via ORAL
  Filled 2012-09-16 (×2): qty 1

## 2012-09-16 MED ORDER — CEFAZOLIN SODIUM-DEXTROSE 2-3 GM-% IV SOLR
INTRAVENOUS | Status: AC
  Filled 2012-09-16: qty 50

## 2012-09-16 MED ORDER — DEXAMETHASONE SODIUM PHOSPHATE 4 MG/ML IJ SOLN
4.0000 mg | Freq: Once | INTRAMUSCULAR | Status: AC
  Administered 2012-09-16: 4 mg via INTRAVENOUS

## 2012-09-16 MED ORDER — DEXAMETHASONE SODIUM PHOSPHATE 4 MG/ML IJ SOLN
INTRAMUSCULAR | Status: AC
  Filled 2012-09-16: qty 1

## 2012-09-16 MED ORDER — OXYCODONE HCL 5 MG PO TABS
ORAL_TABLET | ORAL | Status: AC
  Filled 2012-09-16: qty 1

## 2012-09-16 MED ORDER — ONDANSETRON HCL 4 MG/2ML IJ SOLN
4.0000 mg | Freq: Once | INTRAMUSCULAR | Status: AC | PRN
  Administered 2012-09-16: 4 mg via INTRAVENOUS

## 2012-09-16 MED ORDER — FENTANYL CITRATE 0.05 MG/ML IJ SOLN
25.0000 ug | INTRAMUSCULAR | Status: DC | PRN
  Administered 2012-09-16 (×4): 50 ug via INTRAVENOUS

## 2012-09-16 MED ORDER — METOCLOPRAMIDE HCL 5 MG/ML IJ SOLN
5.0000 mg | Freq: Three times a day (TID) | INTRAMUSCULAR | Status: DC | PRN
  Administered 2012-09-16 – 2012-09-17 (×2): 10 mg via INTRAVENOUS
  Filled 2012-09-16 (×2): qty 2

## 2012-09-16 MED ORDER — LACTATED RINGERS IV SOLN
INTRAVENOUS | Status: DC | PRN
  Administered 2012-09-16 (×2): via INTRAVENOUS

## 2012-09-16 MED ORDER — MIDAZOLAM HCL 2 MG/2ML IJ SOLN
1.0000 mg | INTRAMUSCULAR | Status: DC | PRN
  Administered 2012-09-16: 2 mg via INTRAVENOUS

## 2012-09-16 MED ORDER — LIDOCAINE HCL (CARDIAC) 20 MG/ML IV SOLN
INTRAVENOUS | Status: DC | PRN
  Administered 2012-09-16: 50 mg via INTRAVENOUS

## 2012-09-16 MED ORDER — CEFAZOLIN SODIUM-DEXTROSE 2-3 GM-% IV SOLR: 2.0000 g | INTRAVENOUS | Status: DC

## 2012-09-16 MED ORDER — PANTOPRAZOLE SODIUM 40 MG PO TBEC
40.0000 mg | DELAYED_RELEASE_TABLET | Freq: Two times a day (BID) | ORAL | Status: DC
  Administered 2012-09-16 – 2012-09-18 (×4): 40 mg via ORAL
  Filled 2012-09-16 (×4): qty 1

## 2012-09-16 MED ORDER — FENTANYL CITRATE 0.05 MG/ML IJ SOLN
INTRAMUSCULAR | Status: AC
  Filled 2012-09-16: qty 2

## 2012-09-16 MED ORDER — MIDAZOLAM HCL 2 MG/2ML IJ SOLN
INTRAMUSCULAR | Status: AC
  Filled 2012-09-16: qty 2

## 2012-09-16 MED ORDER — ALUM & MAG HYDROXIDE-SIMETH 200-200-20 MG/5ML PO SUSP: 30.0000 mL | ORAL | Status: DC | PRN

## 2012-09-16 MED ORDER — MENTHOL 3 MG MT LOZG
1.0000 | LOZENGE | OROMUCOSAL | Status: DC | PRN
  Administered 2012-09-17: 3 mg via ORAL
  Filled 2012-09-16: qty 9

## 2012-09-16 MED ORDER — CYCLOBENZAPRINE HCL 10 MG PO TABS: 10.0000 mg | ORAL_TABLET | Freq: Three times a day (TID) | ORAL | Status: DC | PRN

## 2012-09-16 MED ORDER — LINACLOTIDE 145 MCG PO CAPS
290.0000 ug | ORAL_CAPSULE | Freq: Every day | ORAL | Status: DC
  Administered 2012-09-17 – 2012-09-18 (×2): 290 ug via ORAL
  Filled 2012-09-16 (×4): qty 2

## 2012-09-16 MED ORDER — CEFAZOLIN SODIUM-DEXTROSE 2-3 GM-% IV SOLR
INTRAVENOUS | Status: DC | PRN
  Administered 2012-09-16: 2 g via INTRAVENOUS

## 2012-09-16 MED ORDER — AMITRIPTYLINE HCL 25 MG PO TABS
25.0000 mg | ORAL_TABLET | Freq: Every day | ORAL | Status: DC
  Administered 2012-09-16 – 2012-09-17 (×2): 25 mg via ORAL
  Filled 2012-09-16 (×2): qty 1

## 2012-09-16 MED ORDER — ALBUTEROL SULFATE HFA 108 (90 BASE) MCG/ACT IN AERS
INHALATION_SPRAY | RESPIRATORY_TRACT | Status: DC | PRN
  Administered 2012-09-16: 6 via RESPIRATORY_TRACT

## 2012-09-16 MED ORDER — MOMETASONE FURO-FORMOTEROL FUM 200-5 MCG/ACT IN AERO
2.0000 | INHALATION_SPRAY | Freq: Two times a day (BID) | RESPIRATORY_TRACT | Status: DC
  Administered 2012-09-16 – 2012-09-18 (×4): 2 via RESPIRATORY_TRACT
  Filled 2012-09-16: qty 8.8

## 2012-09-16 MED ORDER — OXYCODONE HCL 5 MG PO TABS
5.0000 mg | ORAL_TABLET | ORAL | Status: DC
  Administered 2012-09-16 – 2012-09-18 (×11): 5 mg via ORAL
  Filled 2012-09-16 (×10): qty 1

## 2012-09-16 MED ORDER — KETOROLAC TROMETHAMINE 30 MG/ML IJ SOLN
INTRAMUSCULAR | Status: AC
  Filled 2012-09-16: qty 1

## 2012-09-16 MED ORDER — ONDANSETRON HCL 4 MG/2ML IJ SOLN
4.0000 mg | Freq: Four times a day (QID) | INTRAMUSCULAR | Status: DC
  Administered 2012-09-16 – 2012-09-18 (×5): 4 mg via INTRAVENOUS
  Filled 2012-09-16 (×5): qty 2

## 2012-09-16 MED ORDER — CHLORHEXIDINE GLUCONATE 4 % EX LIQD: 60.0000 mL | Freq: Once | CUTANEOUS | Status: DC

## 2012-09-16 MED ORDER — ONDANSETRON HCL 4 MG/2ML IJ SOLN
INTRAMUSCULAR | Status: AC
  Filled 2012-09-16: qty 2

## 2012-09-16 MED ORDER — METOCLOPRAMIDE HCL 10 MG PO TABS: 5.0000 mg | ORAL_TABLET | Freq: Three times a day (TID) | ORAL | Status: DC | PRN

## 2012-09-16 MED ORDER — SUCCINYLCHOLINE CHLORIDE 20 MG/ML IJ SOLN
INTRAMUSCULAR | Status: DC | PRN
  Administered 2012-09-16: 120 mg via INTRAVENOUS

## 2012-09-16 MED ORDER — ROFLUMILAST 500 MCG PO TABS
500.0000 ug | ORAL_TABLET | Freq: Every morning | ORAL | Status: DC
  Administered 2012-09-17 – 2012-09-18 (×2): 500 ug via ORAL
  Filled 2012-09-16 (×4): qty 1

## 2012-09-16 MED ORDER — TRAZODONE HCL 50 MG PO TABS
300.0000 mg | ORAL_TABLET | Freq: Every day | ORAL | Status: DC
  Administered 2012-09-16 – 2012-09-17 (×2): 300 mg via ORAL
  Filled 2012-09-16 (×2): qty 6

## 2012-09-16 MED ORDER — PROPOFOL 10 MG/ML IV BOLUS
INTRAVENOUS | Status: DC | PRN
  Administered 2012-09-16: 200 mg via INTRAVENOUS
  Administered 2012-09-16: 100 mg via INTRAVENOUS

## 2012-09-16 MED ORDER — ONDANSETRON HCL 4 MG/2ML IJ SOLN
4.0000 mg | Freq: Four times a day (QID) | INTRAMUSCULAR | Status: DC | PRN
  Administered 2012-09-16: 4 mg via INTRAVENOUS
  Filled 2012-09-16: qty 2

## 2012-09-16 MED ORDER — SUCCINYLCHOLINE CHLORIDE 20 MG/ML IJ SOLN
INTRAMUSCULAR | Status: AC
  Filled 2012-09-16: qty 1

## 2012-09-16 MED ORDER — DM-GUAIFENESIN ER 30-600 MG PO TB12
1.0000 | ORAL_TABLET | Freq: Two times a day (BID) | ORAL | Status: DC
  Administered 2012-09-16 – 2012-09-18 (×4): 1 via ORAL
  Filled 2012-09-16 (×4): qty 1

## 2012-09-16 MED ORDER — HYDROMORPHONE HCL PF 1 MG/ML IJ SOLN: 1.0000 mg | INTRAMUSCULAR | Status: DC | PRN

## 2012-09-16 MED ORDER — LIDOCAINE HCL (PF) 1 % IJ SOLN
INTRAMUSCULAR | Status: AC
  Filled 2012-09-16: qty 5

## 2012-09-16 MED ORDER — FENTANYL CITRATE 0.05 MG/ML IJ SOLN
INTRAMUSCULAR | Status: DC | PRN
  Administered 2012-09-16: 100 ug via INTRAVENOUS
  Administered 2012-09-16: 50 ug via INTRAVENOUS

## 2012-09-16 MED ORDER — VITAMIN D (ERGOCALCIFEROL) 1.25 MG (50000 UNIT) PO CAPS
50000.0000 [IU] | ORAL_CAPSULE | ORAL | Status: DC
  Filled 2012-09-16: qty 1

## 2012-09-16 MED ORDER — QUETIAPINE FUMARATE ER 50 MG PO TB24
50.0000 mg | ORAL_TABLET | Freq: Every day | ORAL | Status: DC
  Administered 2012-09-16 – 2012-09-17 (×2): 50 mg via ORAL
  Filled 2012-09-16 (×4): qty 1

## 2012-09-16 MED ORDER — SENNOSIDES-DOCUSATE SODIUM 8.6-50 MG PO TABS: 1.0000 | ORAL_TABLET | Freq: Every evening | ORAL | Status: DC | PRN

## 2012-09-16 MED ORDER — GABAPENTIN 400 MG PO CAPS
800.0000 mg | ORAL_CAPSULE | Freq: Three times a day (TID) | ORAL | Status: DC
  Administered 2012-09-16 – 2012-09-18 (×6): 800 mg via ORAL
  Filled 2012-09-16 (×6): qty 2

## 2012-09-16 MED ORDER — LINACLOTIDE 290 MCG PO CAPS
1.0000 | ORAL_CAPSULE | Freq: Every day | ORAL | Status: DC
  Filled 2012-09-16 (×2): qty 1

## 2012-09-16 MED ORDER — ALBUTEROL SULFATE (5 MG/ML) 0.5% IN NEBU
2.5000 mg | INHALATION_SOLUTION | RESPIRATORY_TRACT | Status: DC
  Administered 2012-09-16 – 2012-09-17 (×3): 2.5 mg via RESPIRATORY_TRACT
  Filled 2012-09-16 (×3): qty 0.5

## 2012-09-16 MED ORDER — KETOROLAC TROMETHAMINE 30 MG/ML IJ SOLN
30.0000 mg | Freq: Once | INTRAMUSCULAR | Status: AC
  Administered 2012-09-16: 30 mg via INTRAVENOUS

## 2012-09-16 MED ORDER — ALBUTEROL SULFATE HFA 108 (90 BASE) MCG/ACT IN AERS
INHALATION_SPRAY | RESPIRATORY_TRACT | Status: AC
  Filled 2012-09-16: qty 6.7

## 2012-09-16 MED ORDER — FENTANYL CITRATE 0.05 MG/ML IJ SOLN
25.0000 ug | INTRAMUSCULAR | Status: AC | PRN
  Administered 2012-09-16 (×2): 25 ug via INTRAVENOUS

## 2012-09-16 MED ORDER — LAMOTRIGINE 100 MG PO TABS
100.0000 mg | ORAL_TABLET | Freq: Every morning | ORAL | Status: DC
  Administered 2012-09-17 – 2012-09-18 (×2): 100 mg via ORAL
  Filled 2012-09-16 (×4): qty 1

## 2012-09-16 MED ORDER — PANTOPRAZOLE SODIUM 40 MG PO TBEC: 40.0000 mg | DELAYED_RELEASE_TABLET | Freq: Every day | ORAL | Status: DC

## 2012-09-16 MED ORDER — CARBAMAZEPINE 100 MG PO CHEW
100.0000 mg | CHEWABLE_TABLET | Freq: Every day | ORAL | Status: DC
  Administered 2012-09-17 – 2012-09-18 (×2): 100 mg via ORAL
  Filled 2012-09-16 (×4): qty 1

## 2012-09-16 MED ORDER — METHOCARBAMOL 100 MG/ML IJ SOLN
500.0000 mg | Freq: Four times a day (QID) | INTRAVENOUS | Status: DC
  Administered 2012-09-16 – 2012-09-18 (×7): 500 mg via INTRAVENOUS
  Filled 2012-09-16 (×9): qty 5

## 2012-09-16 MED ORDER — OXYCODONE-ACETAMINOPHEN 5-325 MG PO TABS
1.0000 | ORAL_TABLET | ORAL | Status: DC
  Administered 2012-09-16 – 2012-09-18 (×9): 1 via ORAL
  Filled 2012-09-16 (×10): qty 1

## 2012-09-16 MED ORDER — PHENOL 1.4 % MT LIQD: 1.0000 | OROMUCOSAL | Status: DC | PRN

## 2012-09-16 MED ORDER — LACTATED RINGERS IV SOLN
INTRAVENOUS | Status: DC
  Administered 2012-09-16: 1000 mL via INTRAVENOUS

## 2012-09-16 MED ORDER — PROPOFOL 10 MG/ML IV EMUL
INTRAVENOUS | Status: AC
  Filled 2012-09-16: qty 20

## 2012-09-16 MED ORDER — AMLODIPINE BESYLATE 5 MG PO TABS
5.0000 mg | ORAL_TABLET | Freq: Every morning | ORAL | Status: DC
  Administered 2012-09-17 – 2012-09-18 (×2): 5 mg via ORAL
  Filled 2012-09-16 (×2): qty 1

## 2012-09-16 MED ORDER — ALPRAZOLAM 1 MG PO TABS
1.0000 mg | ORAL_TABLET | Freq: Three times a day (TID) | ORAL | Status: DC | PRN
  Administered 2012-09-17 – 2012-09-18 (×3): 1 mg via ORAL
  Filled 2012-09-16 (×2): qty 1
  Filled 2012-09-16: qty 2

## 2012-09-16 MED ORDER — LISINOPRIL 10 MG PO TABS
10.0000 mg | ORAL_TABLET | Freq: Every morning | ORAL | Status: DC
  Administered 2012-09-17 – 2012-09-18 (×2): 10 mg via ORAL
  Filled 2012-09-16 (×2): qty 1

## 2012-09-16 MED ORDER — ONDANSETRON HCL 4 MG/2ML IJ SOLN
4.0000 mg | Freq: Once | INTRAMUSCULAR | Status: AC
  Administered 2012-09-16: 4 mg via INTRAVENOUS

## 2012-09-16 MED ORDER — ONDANSETRON HCL 4 MG PO TABS: 4.0000 mg | ORAL_TABLET | Freq: Four times a day (QID) | ORAL | Status: DC | PRN

## 2012-09-16 SURGICAL SUPPLY — 3 items
CLOTH BEACON ORANGE TIMEOUT ST (SAFETY) ×1 IMPLANT
KIT ROOM TURNOVER APOR (KITS) ×2 IMPLANT
PAD ARMBOARD 7.5X6 YLW CONV (MISCELLANEOUS) ×1 IMPLANT

## 2012-09-16 NOTE — Transfer of Care (Signed)
Immediate Anesthesia Transfer of Care Note  Patient: Katelyn Thompson  Procedure(s) Performed: Procedure(s): EXAM UNDER ANESTHESIA WITH MANIPULATION OF RIGHT KNEE (Right)  Patient Location: PACU  Anesthesia Type:General  Level of Consciousness: awake, alert , oriented and patient cooperative  Airway & Oxygen Therapy: Patient Spontanous Breathing and Patient connected to nasal cannula oxygen  Post-op Assessment: Report given to PACU RN and Post -op Vital signs reviewed and stable  Post vital signs: Reviewed and stable  Complications: No apparent anesthesia complications

## 2012-09-16 NOTE — Brief Op Note (Addendum)
09/16/2012  12:55 PM  PATIENT:  Katelyn Thompson  47 y.o. female  PRE-OPERATIVE DIAGNOSIS:  Stiffness of Right Total Knee Arthoplasty  POST-OPERATIVE DIAGNOSIS:  Stiffness of Right Total Knee Arthoplasty  PROCEDURE:  Procedure(s): EXAM UNDER ANESTHESIA WITH MANIPULATION OF RIGHT KNEE (Right)  80 pre manip 120 post manip   The patient was identified in the preop holding area the right knee was marked as a surgical site and confirmed with the patient. Chart was updated. Patient to the operating room for general anesthesia. The glide a scope was used to obtain anesthesia. Once the patient had anesthesia and was relaxed the knee was examined under anesthesia found to be stable with 80 of passive flexion. Manipulation was performed gentle pressure. Post manipulation knee flexed 120.  SURGEON:  Surgeon(s) and Role:    * Stanley E Harrison, MD - Primary  PHYSICIAN ASSISTANT:   ASSISTANTS: none   ANESTHESIA:   general  EBL:     BLOOD ADMINISTERED:none  DRAINS: none   LOCAL MEDICATIONS USED:  NONE  SPECIMEN:  No Specimen  DISPOSITION OF SPECIMEN:  N/A  COUNTS:  YES  TOURNIQUET:  * No tourniquets in log *  DICTATION: .Dragon Dictation  PLAN OF CARE: Admit to inpatient  observation status  PATIENT DISPOSITION:  PACU - hemodynamically stable.   Delay start of Pharmacological VTE agent (>24hrs) due to surgical blood loss or risk of bleeding: no 

## 2012-09-16 NOTE — Op Note (Signed)
Gave 50 mcg Fentanyl to Amy Andraza,CRNA, to be used iin OR.

## 2012-09-16 NOTE — Preoperative (Signed)
Beta Blockers   Reason not to administer Beta Blockers:Not Applicable 

## 2012-09-16 NOTE — Anesthesia Procedure Notes (Signed)
Procedure Name: Intubation Date/Time: 09/16/2012 1:43 PM Performed by: Carolyne Littles, AMY L Pre-anesthesia Checklist: Emergency Drugs available, Patient identified, Patient being monitored, Suction available and Timeout performed Patient Re-evaluated:Patient Re-evaluated prior to inductionOxygen Delivery Method: Circle system utilized Preoxygenation: Pre-oxygenation with 100% oxygen Intubation Type: IV induction, Rapid sequence and Cricoid Pressure applied Ventilation: Two handed mask ventilation required Grade View: Grade II Tube type: Oral Number of attempts: 2 Placement Confirmation: ETT inserted through vocal cords under direct vision,  positive ETCO2 and breath sounds checked- equal and bilateral Secured at: 21 cm Tube secured with: Tape Dental Injury: Teeth and Oropharynx as per pre-operative assessment  Comments: DL x1 Miller 3 grade 2 view, unable to place tube, Intubated with glidescope; VSS throughout

## 2012-09-16 NOTE — Interval H&P Note (Signed)
History and Physical Interval Note:  09/16/2012 12:01 PM  Katelyn Thompson  has presented today for surgery, with the diagnosis of Stiffness of Total Knee Arthoplasty  The various methods of treatment have been discussed with the patient and family. After consideration of risks, benefits and other options for treatment, the patient has consented to  Procedure(s): EXAM UNDER ANESTHESIA WITH MANIPULATION OF RIGHT KNEE (Right) as a surgical intervention .  The patient's history has been reviewed, patient examined, no change in status, stable for surgery.  I have reviewed the patient's chart and labs.  Questions were answered to the patient's satisfaction.     Fuller Canada

## 2012-09-16 NOTE — Anesthesia Preprocedure Evaluation (Addendum)
Anesthesia Evaluation  Patient identified by MRN, date of birth, ID band Patient awake    Reviewed: Allergy & Precautions, H&P , NPO status   History of Anesthesia Complications (+) PONV  Airway Mallampati: II    Mouth opening: Limited Mouth Opening  Dental  (+) Teeth Intact   Pulmonary COPD COPD inhaler,    + decreased breath sounds      Cardiovascular hypertension, Pt. on medications Rhythm:Regular Rate:Normal     Neuro/Psych PSYCHIATRIC DISORDERS Anxiety Depression    GI/Hepatic GERD-  Medicated and Controlled,  Endo/Other  Morbid obesity  Renal/GU      Musculoskeletal   Abdominal   Peds  Hematology   Anesthesia Other Findings   Reproductive/Obstetrics negative OB ROS                           Anesthesia Physical Anesthesia Plan  ASA: III  Anesthesia Plan: General   Post-op Pain Management:    Induction: Intravenous, Rapid sequence and Cricoid pressure planned  Airway Management Planned: Oral ETT  Additional Equipment:   Intra-op Plan:   Post-operative Plan: Extubation in OR  Informed Consent: I have reviewed the patients History and Physical, chart, labs and discussed the procedure including the risks, benefits and alternatives for the proposed anesthesia with the patient or authorized representative who has indicated his/her understanding and acceptance.     Plan Discussed with:   Anesthesia Plan Comments:         Anesthesia Quick Evaluation

## 2012-09-16 NOTE — Anesthesia Postprocedure Evaluation (Addendum)
  Anesthesia Post-op Note  Patient: Katelyn Thompson  Procedure(s) Performed: Procedure(s): EXAM UNDER ANESTHESIA WITH MANIPULATION OF RIGHT KNEE (Right)  Patient Location: PACU  Anesthesia Type:General  Level of Consciousness: awake, alert , oriented and patient cooperative  Airway and Oxygen Therapy: Patient Spontanous Breathing and Patient connected to nasal cannula oxygen  Post-op Pain: moderate  Post-op Assessment: Post-op Vital signs reviewed, Patient's Cardiovascular Status Stable, Respiratory Function Stable, Patent Airway and No signs of Nausea or vomiting  Post-op Vital Signs: Reviewed and stable  Complications: No apparent anesthesia complications 09/17/12  Sore throat today.  Otherwise no anesthesia related problems.  Taking fluids and foods well.

## 2012-09-16 NOTE — Op Note (Signed)
09/16/2012  12:55 PM  PATIENT:  Katelyn Thompson  47 y.o. female  PRE-OPERATIVE DIAGNOSIS:  Stiffness of Right Total Knee Arthoplasty  POST-OPERATIVE DIAGNOSIS:  Stiffness of Right Total Knee Arthoplasty  PROCEDURE:  Procedure(s): EXAM UNDER ANESTHESIA WITH MANIPULATION OF RIGHT KNEE (Right)  80 pre manip 120 post manip   The patient was identified in the preop holding area the right knee was marked as a surgical site and confirmed with the patient. Chart was updated. Patient to the operating room for general anesthesia. The glide a scope was used to obtain anesthesia. Once the patient had anesthesia and was relaxed the knee was examined under anesthesia found to be stable with 80 of passive flexion. Manipulation was performed gentle pressure. Post manipulation knee flexed 120.  SURGEON:  Surgeon(s) and Role:    * Vickki Hearing, MD - Primary  PHYSICIAN ASSISTANT:   ASSISTANTS: none   ANESTHESIA:   general  EBL:     BLOOD ADMINISTERED:none  DRAINS: none   LOCAL MEDICATIONS USED:  NONE  SPECIMEN:  No Specimen  DISPOSITION OF SPECIMEN:  N/A  COUNTS:  YES  TOURNIQUET:  * No tourniquets in log *  DICTATION: .Dragon Dictation  PLAN OF CARE: Admit to inpatient  observation status  PATIENT DISPOSITION:  PACU - hemodynamically stable.   Delay start of Pharmacological VTE agent (>24hrs) due to surgical blood loss or risk of bleeding: no

## 2012-09-17 ENCOUNTER — Encounter: Payer: Medicare Other | Admitting: Physical Therapy

## 2012-09-17 MED ORDER — ALBUTEROL SULFATE (5 MG/ML) 0.5% IN NEBU
2.5000 mg | INHALATION_SOLUTION | Freq: Four times a day (QID) | RESPIRATORY_TRACT | Status: DC | PRN
  Administered 2012-09-18: 2.5 mg via RESPIRATORY_TRACT

## 2012-09-17 NOTE — Progress Notes (Signed)
UR chart review completed.  

## 2012-09-17 NOTE — Progress Notes (Signed)
Subjective: 1 Day Post-Op Procedure(s) (LRB): EXAM UNDER ANESTHESIA WITH MANIPULATION OF RIGHT KNEE (Right) Patient reports pain as Controlled on current regimen.    Objective: Vital signs in last 24 hours: BP 111/69  Pulse 77  Temp(Src) 98 F (36.7 C) (Axillary)  Resp 18  Ht 5\' 5"  (1.651 m)  Wt 231 lb 7.7 oz (105 kg)  BMI 38.52 kg/m2  SpO2 98%   1 Day Post-Op Procedure(s) (LRB): EXAM UNDER ANESTHESIA WITH MANIPULATION OF RIGHT KNEE (Right) Up with therapy  Fuller Canada 09/17/2012, 10:24 AM

## 2012-09-17 NOTE — Evaluation (Signed)
Physical Therapy Evaluation Patient Details Name: Katelyn Thompson MRN: [id number] DOB: 24-Jun-1965 Today's Date: 09/17/2012 Time: 8413-2440 PT Time Calculation (min): 51 min  PT Assessment / Plan / Recommendation Clinical Impression  Pt is seen for initial eval/tx following manipulation of right knee following TKR in March 2014.  She has been independent at home with all ADLs and ambulates with no assistive device.  Currently, her pain is wlll controlled and AA ROM right knee is 10-105 degrees.  She did use a walker for gait due to knee soreness and she was instructed in developing a heel/toe gait sequence.  She was instructed in the importance of ther ex for maintaining maximal knee ROM.  She appears to have tolerated PT well.    PT Assessment  Patient needs continued PT services    Follow Up Recommendations  Outpatient PT    Does the patient have the potential to tolerate intense rehabilitation      Barriers to Discharge None      Equipment Recommendations  None recommended by PT    Recommendations for Other Services     Frequency 7X/week    Precautions / Restrictions Precautions Precautions: None Restrictions Weight Bearing Restrictions: No   Pertinent Vitals/Pain       Mobility  Bed Mobility Bed Mobility: Supine to Sit Supine to Sit: 7: Independent Transfers Transfers: Sit to Stand;Stand to Sit Sit to Stand: 7: Independent Stand to Sit: 7: Independent Ambulation/Gait Ambulation/Gait Assistance: 6: Modified independent (Device/Increase time) Ambulation Distance (Feet): 300 Feet Assistive device: Rolling walker Ambulation/Gait Assistance Details: instructed in heel/ toe gait sequence and this improves stance time on RLE Gait Pattern: Decreased hip/knee flexion - right;Decreased weight shift to right;Decreased dorsiflexion - right Gait velocity: WNL Stairs: No Wheelchair Mobility Wheelchair Mobility: No    Exercises Total Joint Exercises Ankle Circles/Pumps:  AROM;Both;10 reps;Supine Quad Sets: AROM;Both;10 reps;Supine Short Arc Quad: AROM;Right;10 reps;Supine Heel Slides: AROM;AAROM;Right;10 reps;Supine Straight Leg Raises: AROM;Right;10 reps;Supine Knee Flexion: AROM;AAROM;Right;10 reps;Seated Goniometric ROM: 10-105 right knee, AA   PT Diagnosis: Abnormality of gait  PT Problem List: Decreased range of motion PT Treatment Interventions:     PT Goals Acute Rehab PT Goals PT Goal Formulation: With patient Time For Goal Achievement: 09/24/12 Potential to Achieve Goals: Good Pt will Perform Home Exercise Program: with supervision, verbal cues required/provided PT Goal: Perform Home Exercise Program - Progress: Goal set today  Visit Information  Last PT Received On: 09/17/12    Subjective Data  Subjective: I'm getting tired of being in this bed Patient Stated Goal: full knee ROM, no limp   Prior Functioning  Home Living Lives With: Spouse Available Help at Discharge: Family;Available 24 hours/day Type of Home: House Home Access: Stairs to enter [employer] of Steps: 3 Entrance Stairs-Rails: Right;Left;Can reach both Home Layout: One level Home Adaptive Equipment: Walker - rolling;Straight cane;Bedside commode/3-in-1;Shower chair with back Prior Function Level of Independence: Independent Able to Take Stairs?: Yes Driving: Yes Communication Communication: No difficulties    Cognition  Cognition Arousal/Alertness: Awake/alert Behavior During Therapy: WFL for tasks assessed/performed Overall Cognitive Status: Within Functional Limits for tasks assessed    Extremity/Trunk Assessment Right Lower Extremity Assessment RLE ROM/Strength/Tone: Deficits RLE ROM/Strength/Tone Deficits: ROM of knee= 10-105 degrees AA, strength WNL RLE Sensation: WFL - Light Touch RLE Coordination: WFL - gross motor   Balance Balance Balance Assessed: No (WNL by functional observation)  End of Session PT - End of Session Equipment  Utilized During Treatment: Gait belt Activity Tolerance: Patient tolerated  treatment well Patient left: in chair;with call bell/phone within reach Nurse Communication: Mobility status CPM Right Knee CPM Right Knee: Off Additional Comments: pt to have CPM on a total of 18 hours/day...we have coordinated with RN who will begin CPM within the hour  GP Functional Assessment Tool Used: clinical judgement Functional Limitation: Mobility: Walking and moving around Mobility: Walking and Moving Around Current Status (W2956): 0 percent impaired, limited or restricted Mobility: Walking and Moving Around Goal Status (O1308): 0 percent impaired, limited or restricted Mobility: Walking and Moving Around Discharge Status [phone]): 0 percent impaired, limited or restricted   Konrad Penta 09/17/2012, 10:57 AM

## 2012-09-17 NOTE — Progress Notes (Signed)
Occupational Therapy Screen  OT orders received. Patient's chart reviewed. PT eval states that patient is functioning at Mod I - Independent level. Patient is at baseline for ADL. No further acute OT needs at this time; will sign off.   Limmie Patricia, OTR/L,CBIS  09/19/12 1:52PM

## 2012-09-17 NOTE — Care Management Note (Signed)
    Page 1 of 1   09/18/2012     9:13:53 AM   CARE MANAGEMENT NOTE 09/18/2012  Patient:  EKNOOR, NOVACK   Account Number:  [id number]  Date Initiated:  09/17/2012  Documentation initiated by:  Sharrie Rothman  Subjective/Objective Assessment:   Pt admitted from home s/p MUA for stiffness of total knee. Pt lives with her husband and will return home at discharge. Pt has CPM, walker, cane, crutches, and shower chair.     Action/Plan:   Pt stated that her PT has already been arranged with [facility] for 5 days a week. No other Cm needs noted.   Anticipated DC Date:  09/18/2012   Anticipated DC Plan:  HOME/SELF CARE      DC Planning Services  CM consult      Choice offered to / List presented to:             Status of service:  Completed, signed off Medicare Important Message given?   (If response is "NO", the following Medicare IM given date fields will be blank) Date Medicare IM given:   Date Additional Medicare IM given:    Discharge Disposition:  HOME/SELF CARE  Per UR Regulation:    If discussed at Long Length of Stay Meetings, dates discussed:    Comments:  09/18/12 0915 Arlyss Queen, RN BSN CM Pt discharged home today. No CM needs noted.  09/17/12 1338 Arlyss Queen, RN BSN CM

## 2012-09-17 NOTE — Clinical Social Work Note (Signed)
CSW received consult for possible SNF. Pt worked with PT this morning and recommendation is for outpatient PT. CSW signing off but can be reconsulted if needed.  Derenda Fennel, [place] [phone]

## 2012-09-18 ENCOUNTER — Encounter (HOSPITAL_COMMUNITY): Payer: Self-pay | Admitting: Orthopedic Surgery

## 2012-09-18 ENCOUNTER — Ambulatory Visit: Payer: Medicare Other | Admitting: Physical Therapy

## 2012-09-18 MED ORDER — OXYCODONE-ACETAMINOPHEN 5-325 MG PO TABS: 1.0000 | ORAL_TABLET | ORAL | Status: DC | PRN

## 2012-09-18 NOTE — Discharge Summary (Signed)
Physician Discharge Summary  Patient ID: Katelyn Thompson MRN: [id number] DOB/AGE: September 08, 1965 47 y.o.  Admit date: 09/16/2012 Discharge date: 09/18/2012  Admission Diagnoses:  Discharge Diagnoses:  Active Problems:   * No active hospital problems. *   Discharged Condition: good  Hospital Course: admitted 6.9.14 for MUA after right tka. MUA was succesful. Post op pain was well controlled. PT was well tolerated. ROM 105. AMBULATION STABLE.   Consults: None    Discharge Exam: Blood pressure 139/82, pulse 92, temperature 97.6 F (36.4 C), temperature source Axillary, resp. rate 20, height 5\' 5"  (1.651 m), weight 231 lb 7.7 oz (105 kg), SpO2 94.00%. Extremities: Homans sign is negative, no sign of DVT, no edema, redness or tenderness in the calves or thighs and arom 5-95  Disposition: 06-Home-Health Care Svc  Discharge Orders   Future Appointments Provider Department Dept Phone   09/18/2012 2:00 PM [place] W [place], [facility] [phone]   09/19/2012 3:15 PM Cristal Ford, PT [facility] [phone]   09/19/2012 4:30 PM Vickki Hearing, MD [facility] [phone]   Future Orders Complete By Expires     CPM  As directed     Comments:      Continuous passive motion machine (CPM):      Use the CPM from 0 to 90 for several hours per day as tolerated            Use CPM for 3 weeks or until you are told to stop.    Call MD / Call 911  As directed     Comments:      If you experience chest pain or shortness of breath, CALL 911 and be transported to the hospital emergency room.  If you develope a fever above 101 F, pus (white drainage) or increased drainage or redness at the wound, or calf pain, call your surgeon's office.    Constipation Prevention  As directed     Comments:      Drink plenty of fluids.  Prune juice may be helpful.  You may use a stool softener, such as Colace (over the  counter) 100 mg twice a day.  Use MiraLax (over the counter) for constipation as needed.    Diet - low sodium heart healthy  As directed     Discharge instructions  As directed     Comments:      Continue therapy at home and at outpatient rehab    Increase activity slowly as tolerated  As directed         Medication List    TAKE these medications       ADVANCED FIBER COMPLEX PO  Take 2 capsules by mouth daily.     ALPRAZolam 1 MG tablet  Commonly known as:  XANAX  Take 1 mg by mouth 3 (three) times daily as needed for anxiety.     amitriptyline 25 MG tablet  Commonly known as:  ELAVIL  Take 25 mg by mouth at bedtime.     amLODipine 5 MG tablet  Commonly known as:  NORVASC  Take 5 mg by mouth every morning.     BLACK COHOSH PO  Take 175 mg by mouth 3 (three) times daily.     carbamazepine 100 MG chewable tablet  Commonly known as:  TEGRETOL  Chew 100 mg by mouth daily.     cyclobenzaprine 10 MG tablet  Commonly known as:  FLEXERIL  Take 10 mg by mouth 3 (three)  times daily as needed for muscle spasms.     DALIRESP 500 MCG Tabs tablet  Generic drug:  roflumilast  Take 500 mcg by mouth every morning.     dexlansoprazole 60 MG capsule  Commonly known as:  DEXILANT  Take 1 capsule (60 mg total) by mouth daily.     dextromethorphan-guaiFENesin 30-600 MG per 12 hr tablet  Commonly known as:  MUCINEX DM  Take 1 tablet by mouth every 12 (twelve) hours.     DULERA 200-5 MCG/ACT Aero  Generic drug:  mometasone-formoterol  Inhale 2 puffs into the lungs 2 (two) times daily.     ergocalciferol 50000 UNITS capsule  Commonly known as:  VITAMIN D2  Take 50,000 Units by mouth once a week. On fridays     gabapentin 800 MG tablet  Commonly known as:  NEURONTIN  Take 800 mg by mouth 3 (three) times daily.     lamoTRIgine 100 MG tablet  Commonly known as:  LAMICTAL  Take 100 mg by mouth every morning.     Linaclotide 290 MCG Caps  Take 1 capsule by mouth daily. 30  minutes before a meal     lisinopril 10 MG tablet  Commonly known as:  PRINIVIL,ZESTRIL  Take 10 mg by mouth every morning.     morphine 30 MG 12 hr tablet  Commonly known as:  MS CONTIN  Take 1 tablet (30 mg total) by mouth 2 (two) times daily.     oxyCODONE-acetaminophen 5-325 MG per tablet  Commonly known as:  PERCOCET/ROXICET  Take 1 tablet by mouth every 4 (four) hours as needed for pain.     pantoprazole 40 MG tablet  Commonly known as:  PROTONIX  Take 1 tablet (40 mg total) by mouth 2 (two) times daily.     PROVENTIL (2.5 MG/3ML) 0.083% nebulizer solution  Generic drug:  albuterol  Take 2.5 mg by nebulization every 6 (six) hours as needed for shortness of breath.     senna-docusate 8.6-50 MG per tablet  Commonly known as:  Senokot-S  Take 1 tablet by mouth at bedtime as needed.     SEROQUEL XR 50 MG Tb24  Generic drug:  QUEtiapine  Take 50 mg by mouth at bedtime.     traZODone 150 MG tablet  Commonly known as:  DESYREL  Take 300 mg by mouth at bedtime.           Follow-up Information   Follow up with Fuller Canada, MD On 09/23/2012. (3pm )    Contact information:   [address], [address], [place] [address] [phone]       Signed: Fuller Canada 09/18/2012, 7:46 AM

## 2012-09-18 NOTE — Progress Notes (Addendum)
IV removed, site WNL.  Pt given d/c instructions and new prescriptions.  Discussed home care with patient and discussed home medications, patient verbalizes understanding, teachback completed. Pt has CPM machine at home, outpt PT arranged, pt states she knows how to use CPM machine and is aware she needs to use it several hours a day.  F/U appointment in place, pt states they will keep appointment. Pt is stable at this time.

## 2012-09-18 NOTE — Progress Notes (Signed)
CPM machine placed on patient.

## 2012-09-18 NOTE — Progress Notes (Signed)
Pt taken to main entrance in wheelchair by staff member.

## 2012-09-18 NOTE — Progress Notes (Signed)
UR chart review completed.  

## 2012-09-19 ENCOUNTER — Ambulatory Visit: Payer: Medicare Other | Admitting: Physical Therapy

## 2012-09-19 ENCOUNTER — Ambulatory Visit: Payer: Medicare Other | Admitting: Orthopedic Surgery

## 2012-09-20 ENCOUNTER — Ambulatory Visit: Payer: Medicare Other | Admitting: *Deleted

## 2012-09-23 ENCOUNTER — Ambulatory Visit (INDEPENDENT_AMBULATORY_CARE_PROVIDER_SITE_OTHER): Payer: Medicare Other | Admitting: Orthopedic Surgery

## 2012-09-23 ENCOUNTER — Ambulatory Visit: Payer: Medicare Other | Admitting: Orthopedic Surgery

## 2012-09-23 ENCOUNTER — Encounter: Payer: Self-pay | Admitting: Orthopedic Surgery

## 2012-09-23 ENCOUNTER — Ambulatory Visit: Payer: Medicare Other | Admitting: Physical Therapy

## 2012-09-23 VITALS — BP 140/98 | Ht 64.0 in | Wt 231.0 lb

## 2012-09-23 DIAGNOSIS — Z96659 Presence of unspecified artificial knee joint: Secondary | ICD-10-CM

## 2012-09-23 DIAGNOSIS — Z96651 Presence of right artificial knee joint: Secondary | ICD-10-CM

## 2012-09-23 MED ORDER — OXYCODONE-ACETAMINOPHEN 5-325 MG PO TABS: 1.0000 | ORAL_TABLET | ORAL | Status: DC | PRN

## 2012-09-23 NOTE — Patient Instructions (Addendum)
Take Percoet for pain  Continue therapy  Return in 2 weeks

## 2012-09-23 NOTE — Progress Notes (Signed)
Patient ID: Katelyn Thompson, female   DOB: 1966-02-02, 47 y.o.   MRN: [id number] Chief Complaint  Patient presents with  . Follow-up    Post op Manipulation of right TKA. DOS 09-16-12.    Notes indicate 10-103  Pain med refilled   F/u 2 weeks

## 2012-09-24 ENCOUNTER — Ambulatory Visit: Payer: Medicare Other | Admitting: Physical Therapy

## 2012-09-24 ENCOUNTER — Encounter: Payer: Self-pay | Admitting: Internal Medicine

## 2012-09-25 ENCOUNTER — Ambulatory Visit: Payer: Medicare Other | Admitting: Physical Therapy

## 2012-09-26 ENCOUNTER — Ambulatory Visit: Payer: Medicare Other | Admitting: Physical Therapy

## 2012-09-27 ENCOUNTER — Ambulatory Visit: Payer: Medicare Other

## 2012-09-30 ENCOUNTER — Ambulatory Visit: Payer: Medicare Other | Admitting: Physical Therapy

## 2012-10-01 ENCOUNTER — Ambulatory Visit: Payer: Medicare Other | Admitting: Physical Therapy

## 2012-10-02 ENCOUNTER — Ambulatory Visit: Payer: Medicare Other | Admitting: Physical Therapy

## 2012-10-03 ENCOUNTER — Ambulatory Visit: Payer: Medicare Other | Admitting: Physical Therapy

## 2012-10-04 ENCOUNTER — Encounter: Payer: Medicare Other | Admitting: Physical Therapy

## 2012-10-07 ENCOUNTER — Ambulatory Visit: Payer: Medicare Other | Admitting: *Deleted

## 2012-10-08 ENCOUNTER — Encounter: Payer: Self-pay | Admitting: Orthopedic Surgery

## 2012-10-08 ENCOUNTER — Ambulatory Visit (INDEPENDENT_AMBULATORY_CARE_PROVIDER_SITE_OTHER): Payer: Medicare Other | Admitting: Orthopedic Surgery

## 2012-10-08 ENCOUNTER — Encounter: Payer: Medicare Other | Admitting: Physical Therapy

## 2012-10-08 VITALS — BP 128/88 | Ht 64.0 in | Wt 220.0 lb

## 2012-10-08 DIAGNOSIS — M25661 Stiffness of right knee, not elsewhere classified: Secondary | ICD-10-CM

## 2012-10-08 DIAGNOSIS — Z96659 Presence of unspecified artificial knee joint: Secondary | ICD-10-CM

## 2012-10-08 DIAGNOSIS — M25669 Stiffness of unspecified knee, not elsewhere classified: Secondary | ICD-10-CM

## 2012-10-08 NOTE — Progress Notes (Signed)
Patient ID: Katelyn Thompson, female   DOB: 1965/09/12, 47 y.o.   MRN: [id number] Chief Complaint  Patient presents with  . Follow-up    2 week recheck ROM right knee, post op MUA 09/16/12   History surgery was June 9 over about 3 weeks into this and her range of motion about 90 fast for just plan I recommended that she get one I encouraged her I showed her her pictures 120 range of motion and advised to continue therapy 3 times a week see me in about a month, very concerned.

## 2012-10-08 NOTE — Patient Instructions (Addendum)
PT 3 x a week

## 2012-10-09 ENCOUNTER — Ambulatory Visit: Payer: Medicare Other | Attending: Orthopedic Surgery | Admitting: Physical Therapy

## 2012-10-09 DIAGNOSIS — M25569 Pain in unspecified knee: Secondary | ICD-10-CM | POA: Insufficient documentation

## 2012-10-09 DIAGNOSIS — Z96659 Presence of unspecified artificial knee joint: Secondary | ICD-10-CM | POA: Insufficient documentation

## 2012-10-09 DIAGNOSIS — R5381 Other malaise: Secondary | ICD-10-CM | POA: Insufficient documentation

## 2012-10-09 DIAGNOSIS — M25669 Stiffness of unspecified knee, not elsewhere classified: Secondary | ICD-10-CM | POA: Insufficient documentation

## 2012-10-09 DIAGNOSIS — R269 Unspecified abnormalities of gait and mobility: Secondary | ICD-10-CM | POA: Insufficient documentation

## 2012-10-10 ENCOUNTER — Ambulatory Visit: Payer: Medicare Other | Admitting: Physical Therapy

## 2012-10-15 ENCOUNTER — Ambulatory Visit: Payer: Medicare Other | Admitting: Physical Therapy

## 2012-10-17 ENCOUNTER — Encounter: Payer: Medicare Other | Admitting: Physical Therapy

## 2012-10-18 ENCOUNTER — Ambulatory Visit: Payer: Medicare Other | Admitting: *Deleted

## 2012-10-21 ENCOUNTER — Ambulatory Visit: Payer: Medicare Other | Admitting: *Deleted

## 2012-10-23 ENCOUNTER — Ambulatory Visit: Payer: Medicare Other | Admitting: *Deleted

## 2012-10-25 ENCOUNTER — Ambulatory Visit: Payer: Medicare Other | Admitting: Physical Therapy

## 2012-10-28 ENCOUNTER — Ambulatory Visit: Payer: Medicare Other | Admitting: Physical Therapy

## 2012-10-29 ENCOUNTER — Encounter: Payer: Medicare Other | Admitting: Physical Therapy

## 2012-10-30 ENCOUNTER — Ambulatory Visit: Payer: Medicare Other | Admitting: Physical Therapy

## 2012-11-01 ENCOUNTER — Encounter: Payer: Medicare Other | Admitting: Physical Therapy

## 2012-11-04 ENCOUNTER — Ambulatory Visit: Payer: Medicare Other | Admitting: Physical Therapy

## 2012-11-04 ENCOUNTER — Other Ambulatory Visit: Payer: Self-pay | Admitting: *Deleted

## 2012-11-04 DIAGNOSIS — Z96651 Presence of right artificial knee joint: Secondary | ICD-10-CM

## 2012-11-04 MED ORDER — CYCLOBENZAPRINE HCL 10 MG PO TABS: 10.0000 mg | ORAL_TABLET | Freq: Three times a day (TID) | ORAL | Status: DC | PRN

## 2012-11-05 ENCOUNTER — Ambulatory Visit (INDEPENDENT_AMBULATORY_CARE_PROVIDER_SITE_OTHER): Payer: Medicare Other | Admitting: Orthopedic Surgery

## 2012-11-05 VITALS — BP 123/88 | Ht 64.0 in | Wt 220.0 lb

## 2012-11-05 DIAGNOSIS — Z96659 Presence of unspecified artificial knee joint: Secondary | ICD-10-CM

## 2012-11-05 DIAGNOSIS — Z96651 Presence of right artificial knee joint: Secondary | ICD-10-CM

## 2012-11-05 MED ORDER — OXYCODONE-ACETAMINOPHEN 5-325 MG PO TABS: 1.0000 | ORAL_TABLET | ORAL | Status: DC | PRN

## 2012-11-05 NOTE — Progress Notes (Signed)
Patient ID: Katelyn Thompson, female   DOB: 01-09-1966, 47 y.o.   MRN: [id number] Chief Complaint  Patient presents with  . Follow-up    4 week recheck ROM S/P manipulation DOS 09/16/12    The patient is not really improved as far as I can see she still at 85 of knee flexion still complains of a lot of pain I don't think she'll get any better we can continue therapy to try to maximize her improvement but this looks like a knee replacement with chronic pain and stiffness and a normal film  Unfortunate for her. Continue current medication therapy followup one month

## 2012-11-05 NOTE — Patient Instructions (Addendum)
Return in 1 month

## 2012-11-06 ENCOUNTER — Ambulatory Visit: Payer: Medicare Other | Admitting: Physical Therapy

## 2012-11-07 ENCOUNTER — Ambulatory Visit: Payer: Medicare Other | Admitting: Physical Therapy

## 2012-11-12 ENCOUNTER — Ambulatory Visit: Payer: Medicare Other | Attending: Orthopedic Surgery | Admitting: Physical Therapy

## 2012-11-12 DIAGNOSIS — R269 Unspecified abnormalities of gait and mobility: Secondary | ICD-10-CM | POA: Insufficient documentation

## 2012-11-12 DIAGNOSIS — R5381 Other malaise: Secondary | ICD-10-CM | POA: Insufficient documentation

## 2012-11-12 DIAGNOSIS — M25669 Stiffness of unspecified knee, not elsewhere classified: Secondary | ICD-10-CM | POA: Insufficient documentation

## 2012-11-12 DIAGNOSIS — M25569 Pain in unspecified knee: Secondary | ICD-10-CM | POA: Insufficient documentation

## 2012-11-12 DIAGNOSIS — Z96659 Presence of unspecified artificial knee joint: Secondary | ICD-10-CM | POA: Insufficient documentation

## 2012-11-13 ENCOUNTER — Ambulatory Visit: Payer: Medicare Other | Admitting: Physical Therapy

## 2012-11-15 ENCOUNTER — Ambulatory Visit: Payer: Medicare Other | Admitting: Physical Therapy

## 2012-11-18 ENCOUNTER — Ambulatory Visit: Payer: Medicare Other | Admitting: Physical Therapy

## 2012-11-19 ENCOUNTER — Ambulatory Visit: Payer: Medicare Other | Admitting: Physical Therapy

## 2012-11-20 ENCOUNTER — Ambulatory Visit: Payer: Medicare Other

## 2012-11-22 ENCOUNTER — Telehealth: Payer: Self-pay | Admitting: Orthopedic Surgery

## 2012-11-22 NOTE — Telephone Encounter (Signed)
Routing to Dr. Romeo Apple. Patient is aware she will have to pick up written prescription if you agree to refill.

## 2012-11-25 ENCOUNTER — Ambulatory Visit: Payer: Medicare Other | Admitting: Physical Therapy

## 2012-11-26 ENCOUNTER — Encounter: Payer: Medicare Other | Admitting: Physical Therapy

## 2012-11-27 ENCOUNTER — Ambulatory Visit: Payer: Medicare Other | Admitting: Physical Therapy

## 2012-11-28 ENCOUNTER — Telehealth: Payer: Self-pay | Admitting: Orthopedic Surgery

## 2012-11-28 ENCOUNTER — Other Ambulatory Visit: Payer: Self-pay | Admitting: Orthopedic Surgery

## 2012-11-28 ENCOUNTER — Telehealth: Payer: Self-pay | Admitting: *Deleted

## 2012-11-28 DIAGNOSIS — Z96651 Presence of right artificial knee joint: Secondary | ICD-10-CM

## 2012-11-28 MED ORDER — OXYCODONE-ACETAMINOPHEN 5-325 MG PO TABS: 1.0000 | ORAL_TABLET | ORAL | Status: DC | PRN

## 2012-11-28 MED ORDER — PANTOPRAZOLE SODIUM 40 MG PO TBEC: 40.0000 mg | DELAYED_RELEASE_TABLET | Freq: Two times a day (BID) | ORAL | Status: DC

## 2012-11-28 NOTE — Telephone Encounter (Signed)
Patient aware to pickup prescription 

## 2012-11-28 NOTE — Telephone Encounter (Signed)
rx ready 

## 2012-11-28 NOTE — Telephone Encounter (Signed)
Katelyn Thompson says she only has 2 oxycodone pills left, needs new prescription.  Will call to pick up if done.  Her # [phone]

## 2012-11-28 NOTE — Telephone Encounter (Signed)
Pt called stating [facility] put RX for 30 and she has been getting 60, pt states 30 is not enough, medication is for her acid reflux, medication is cantoroprazole 30mg . Please advise [phone].

## 2012-11-28 NOTE — Telephone Encounter (Signed)
Medication is pantoprazole. Routing to refill box

## 2012-11-28 NOTE — Telephone Encounter (Signed)
Done

## 2012-12-02 ENCOUNTER — Encounter: Payer: Medicare Other | Admitting: Physical Therapy

## 2012-12-03 ENCOUNTER — Other Ambulatory Visit: Payer: Self-pay | Admitting: *Deleted

## 2012-12-03 DIAGNOSIS — Z96651 Presence of right artificial knee joint: Secondary | ICD-10-CM

## 2012-12-03 MED ORDER — CYCLOBENZAPRINE HCL 10 MG PO TABS: 10.0000 mg | ORAL_TABLET | Freq: Three times a day (TID) | ORAL | Status: DC | PRN

## 2012-12-04 ENCOUNTER — Ambulatory Visit: Payer: Medicare Other

## 2012-12-05 ENCOUNTER — Ambulatory Visit: Payer: Medicare Other

## 2012-12-10 ENCOUNTER — Ambulatory Visit (INDEPENDENT_AMBULATORY_CARE_PROVIDER_SITE_OTHER): Payer: Medicare Other | Admitting: Orthopedic Surgery

## 2012-12-10 ENCOUNTER — Encounter: Payer: Self-pay | Admitting: Orthopedic Surgery

## 2012-12-10 VITALS — BP 121/84 | Ht 65.0 in | Wt 214.0 lb

## 2012-12-10 DIAGNOSIS — Z96659 Presence of unspecified artificial knee joint: Secondary | ICD-10-CM

## 2012-12-10 DIAGNOSIS — Z96651 Presence of right artificial knee joint: Secondary | ICD-10-CM

## 2012-12-10 MED ORDER — OXYCODONE-ACETAMINOPHEN 5-325 MG PO TABS: 1.0000 | ORAL_TABLET | ORAL | Status: DC | PRN

## 2012-12-10 NOTE — Progress Notes (Signed)
Patient ID: Katelyn Thompson, female   DOB: Sep 22, 1965, 47 y.o.   MRN: [id number] Chief Complaint  Patient presents with  . Follow-up    1 month recheck right knee s/p MUA DOS 09/16/12    Therapy notes indicate that she has range of motion -10-100 I see 0-80 in the office she says she can make it in better after she's worked with the little bit more so we will continue therapy continue the JAS splint followup with me in about 6-8 weeks

## 2012-12-10 NOTE — Patient Instructions (Addendum)
Continue PT x 2 weeks Continue JAS splint until you come back

## 2012-12-11 ENCOUNTER — Ambulatory Visit: Payer: Medicare Other | Attending: Orthopedic Surgery | Admitting: Physical Therapy

## 2012-12-11 DIAGNOSIS — R269 Unspecified abnormalities of gait and mobility: Secondary | ICD-10-CM | POA: Insufficient documentation

## 2012-12-11 DIAGNOSIS — Z96659 Presence of unspecified artificial knee joint: Secondary | ICD-10-CM | POA: Insufficient documentation

## 2012-12-11 DIAGNOSIS — R5381 Other malaise: Secondary | ICD-10-CM | POA: Insufficient documentation

## 2012-12-11 DIAGNOSIS — M25669 Stiffness of unspecified knee, not elsewhere classified: Secondary | ICD-10-CM | POA: Insufficient documentation

## 2012-12-11 DIAGNOSIS — M25569 Pain in unspecified knee: Secondary | ICD-10-CM | POA: Insufficient documentation

## 2012-12-12 ENCOUNTER — Ambulatory Visit: Payer: Medicare Other | Admitting: Physical Therapy

## 2012-12-16 ENCOUNTER — Ambulatory Visit: Payer: Medicare Other

## 2012-12-18 ENCOUNTER — Ambulatory Visit: Payer: Medicare Other

## 2012-12-19 ENCOUNTER — Ambulatory Visit: Payer: Medicare Other

## 2013-01-15 ENCOUNTER — Telehealth: Payer: Self-pay | Admitting: Orthopedic Surgery

## 2013-01-15 ENCOUNTER — Other Ambulatory Visit: Payer: Self-pay | Admitting: Orthopedic Surgery

## 2013-01-15 DIAGNOSIS — Z96651 Presence of right artificial knee joint: Secondary | ICD-10-CM

## 2013-01-15 MED ORDER — OXYCODONE-ACETAMINOPHEN 5-325 MG PO TABS: 1.0000 | ORAL_TABLET | ORAL | Status: DC | PRN

## 2013-01-15 NOTE — Telephone Encounter (Signed)
PICKUP

## 2013-01-15 NOTE — Telephone Encounter (Signed)
Cecelia Byars wants a new Percocet prescription  Her # [phone]

## 2013-01-21 NOTE — Telephone Encounter (Signed)
Patient picked up the prescription.

## 2013-02-11 ENCOUNTER — Ambulatory Visit (INDEPENDENT_AMBULATORY_CARE_PROVIDER_SITE_OTHER): Payer: Medicare Other | Admitting: Orthopedic Surgery

## 2013-02-11 VITALS — BP 124/87 | Ht 65.0 in | Wt 214.0 lb

## 2013-02-11 DIAGNOSIS — M1710 Unilateral primary osteoarthritis, unspecified knee: Secondary | ICD-10-CM

## 2013-02-11 DIAGNOSIS — M25661 Stiffness of right knee, not elsewhere classified: Secondary | ICD-10-CM

## 2013-02-11 DIAGNOSIS — Z96651 Presence of right artificial knee joint: Secondary | ICD-10-CM

## 2013-02-11 DIAGNOSIS — M25669 Stiffness of unspecified knee, not elsewhere classified: Secondary | ICD-10-CM

## 2013-02-11 DIAGNOSIS — Z96659 Presence of unspecified artificial knee joint: Secondary | ICD-10-CM

## 2013-02-11 DIAGNOSIS — M179 Osteoarthritis of knee, unspecified: Secondary | ICD-10-CM

## 2013-02-11 NOTE — Patient Instructions (Signed)
Make appointment for ASAP left knee 8:30 02/12/13

## 2013-02-12 ENCOUNTER — Encounter: Payer: Self-pay | Admitting: Orthopedic Surgery

## 2013-02-12 ENCOUNTER — Ambulatory Visit (INDEPENDENT_AMBULATORY_CARE_PROVIDER_SITE_OTHER): Payer: Medicare Other | Admitting: Orthopedic Surgery

## 2013-02-12 ENCOUNTER — Ambulatory Visit (INDEPENDENT_AMBULATORY_CARE_PROVIDER_SITE_OTHER): Payer: Medicare Other

## 2013-02-12 VITALS — BP 140/89 | Ht 65.0 in | Wt 214.0 lb

## 2013-02-12 DIAGNOSIS — M25562 Pain in left knee: Secondary | ICD-10-CM

## 2013-02-12 DIAGNOSIS — Z96651 Presence of right artificial knee joint: Secondary | ICD-10-CM

## 2013-02-12 DIAGNOSIS — M25469 Effusion, unspecified knee: Secondary | ICD-10-CM

## 2013-02-12 DIAGNOSIS — M25569 Pain in unspecified knee: Secondary | ICD-10-CM

## 2013-02-12 DIAGNOSIS — Z96659 Presence of unspecified artificial knee joint: Secondary | ICD-10-CM

## 2013-02-12 DIAGNOSIS — M25462 Effusion, left knee: Secondary | ICD-10-CM

## 2013-02-12 DIAGNOSIS — G8929 Other chronic pain: Secondary | ICD-10-CM | POA: Insufficient documentation

## 2013-02-12 MED ORDER — OXYCODONE-ACETAMINOPHEN 5-325 MG PO TABS: 1.0000 | ORAL_TABLET | ORAL | Status: DC | PRN

## 2013-02-12 NOTE — Progress Notes (Signed)
Patient ID: Katelyn Thompson, female   DOB: 1965-09-26, 47 y.o.   MRN: [id number] Chief Complaint  Patient presents with  . Follow-up    2 month recheck on right knee MUA. DOS 09-16-12.    The patient is status post manipulation of the right knee in June for surgery done in March 2014 At this point it is clear that she will not get more than 95 of knee flexion.  She will probably have chronic pain  Review of systems left knee pain swelling  Today's exam BP 124/87  Ht 5\' 5"  (1.651 m)  Wt 214 lb (97.07 kg)  BMI 35.61 kg/m2 General appearance is normal, the patient is alert and oriented x3 with normal mood and affect.  Her knee range of motion today 0-95 she does have good extension and good extension contraction. Her neurovascular exam is intact  Impression status post manipulation right knee status post total knee  Complains of posterior knee pain mild anterior knee pain   She's complaining about her left knee with swelling and we'll see her tomorrow for that.

## 2013-02-12 NOTE — Progress Notes (Signed)
  Subjective:    Patient ID: Katelyn Thompson, female    DOB: 1965/05/12, 47 y.o.   MRN: [id number]  HPI Comments: The patient is having catching swelling of the knee joint pain with walking  Knee Pain  The incident occurred more than 1 week ago. The incident occurred at home. There was no injury mechanism. The pain is present in the left knee. The pain is at a severity of 8/10. The pain has been worsening since onset. Associated symptoms include a loss of motion. Pertinent negatives include no inability to bear weight, loss of sensation, muscle weakness, numbness or tingling.      Review of Systems  Respiratory: Positive for cough, shortness of breath and wheezing.   Cardiovascular: Negative.   Gastrointestinal: Positive for constipation.  Neurological: Negative for tingling and numbness.  Psychiatric/Behavioral: The patient is nervous/anxious.        Objective:   Physical Exam BP 140/89  Ht 5\' 5"  (1.651 m)  Wt 214 lb (97.07 kg)  BMI 35.61 kg/m2 General appearance is normal, the patient is alert and oriented x3 with normal mood and affect. The patient is having difficulty ambulating and is using a cane she is limping the left lower extremity  Her left knee has a large joint effusion which blocks her range of motion limiting it to 90 of flexion. She has difficulty with extending the knee. The knee feels stable motor exam is normal the skin is intact she has good distal pulse and normal sensation  The x-ray shows no degenerative changes but joint effusion       Assessment & Plan:  Joint effusion  Recommend aspiration injection  Ice elevation limit activity  Followup if not improved  Aspirate LEFT knee.  Informed consent was obtained. Timeout was completed.  Knee prepped with alcohol and Ethyl chloride  LEFT knee aspiration  100  cc of clear synovial fluid.  Followed by injection of Depo-Medrol 40 mg and lidocaine 1% 4 cc.  No complications

## 2013-02-12 NOTE — Patient Instructions (Signed)
Ice  Ace wrap x 1 week

## 2013-02-13 ENCOUNTER — Ambulatory Visit: Payer: Medicare Other | Admitting: Orthopedic Surgery

## 2013-03-18 ENCOUNTER — Telehealth: Payer: Self-pay | Admitting: Orthopedic Surgery

## 2013-03-18 ENCOUNTER — Other Ambulatory Visit: Payer: Self-pay | Admitting: Orthopedic Surgery

## 2013-03-18 MED ORDER — MORPHINE SULFATE ER 15 MG PO TBCR: 15.0000 mg | EXTENDED_RELEASE_TABLET | Freq: Two times a day (BID) | ORAL | Status: DC

## 2013-03-18 NOTE — Telephone Encounter (Signed)
Katelyn Thompson wants a prescription for Oxycodone

## 2013-03-19 NOTE — Telephone Encounter (Signed)
Katelyn Thompson picked up the prescription

## 2013-04-16 ENCOUNTER — Other Ambulatory Visit: Payer: Self-pay | Admitting: *Deleted

## 2013-04-16 ENCOUNTER — Telehealth: Payer: Self-pay | Admitting: Orthopedic Surgery

## 2013-04-16 DIAGNOSIS — Z96651 Presence of right artificial knee joint: Secondary | ICD-10-CM

## 2013-04-16 MED ORDER — MORPHINE SULFATE ER 15 MG PO TBCR: 15.0000 mg | EXTENDED_RELEASE_TABLET | Freq: Two times a day (BID) | ORAL | Status: DC

## 2013-04-16 NOTE — Telephone Encounter (Signed)
refill 

## 2013-04-16 NOTE — Telephone Encounter (Signed)
Routing to Dr Harrison 

## 2013-04-16 NOTE — Telephone Encounter (Signed)
Refilled per Dr. Romeo Apple, and called patient to pick up prescription tomorrow 04/17/13, after DR. Harrison signs it.

## 2013-04-16 NOTE — Telephone Encounter (Signed)
Patient called, requests refill on pain medication, which she states "runs out Sunday, 04/20/13" - medication is: morphine (MS CONTIN) 15 MG 12 hr tablet [[id number]][[id number]] Her next scheduled appointment here is 07/01/13.  Please advise.  Patient ph# [phone].

## 2013-04-16 NOTE — Telephone Encounter (Signed)
I was refilling prescription for MS Contin per DR. Romeo AppleHarrison, and the prescription printed twice due to system wide delayed printing problem in [facility]. I have shredded the one prescription with Jacklynn GanongBrenda Freeman and Cammie Sicklearol Foltz as witnesses.

## 2013-04-29 ENCOUNTER — Ambulatory Visit (INDEPENDENT_AMBULATORY_CARE_PROVIDER_SITE_OTHER): Payer: Medicare Other | Admitting: Orthopedic Surgery

## 2013-04-29 ENCOUNTER — Encounter: Payer: Self-pay | Admitting: Orthopedic Surgery

## 2013-04-29 VITALS — BP 127/85 | Ht 65.0 in | Wt 214.0 lb

## 2013-04-29 DIAGNOSIS — M7630 Iliotibial band syndrome, unspecified leg: Secondary | ICD-10-CM

## 2013-04-29 DIAGNOSIS — M629 Disorder of muscle, unspecified: Secondary | ICD-10-CM

## 2013-04-29 DIAGNOSIS — M25462 Effusion, left knee: Secondary | ICD-10-CM

## 2013-04-29 DIAGNOSIS — M25469 Effusion, unspecified knee: Secondary | ICD-10-CM

## 2013-04-29 NOTE — Progress Notes (Signed)
Patient ID: Tandy GawJalene W Thompson, female   DOB: 1965/09/10, 48 y.o.   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  Verbal consent was obtained, timeout was completed.  Sterile prep of the LEFT knee.  Lateral approach, suprapatellar  Aspiration of 60 cc of clear, yellow fluid.  Injection of Depo-Medrol 40 mg per cc, 1 cc, and 4 cc 1% plain lidocaine.  No complications.  Right knee tenderness is noted over the iliotibial band. The joint is without effusion. The knee joint is stable. Muscle tone is normal. The skin is without erythema. The knee flexion ARC is 90. She does come to full extension. Neurovascular exam is normal.General appearance is normal, the patient is alert and oriented x3 with normal mood and affect. BP 127/85  Ht 5\' 5"  (1.651 m)  Wt 214 lb (97.07 kg)  BMI 35.61 kg/m2   We injected the iliotibial band.  Followup as scheduled  Coding [id number] injection and aspiration left knee  [id number], 20610 injection iliotibial band right knee

## 2013-04-29 NOTE — Patient Instructions (Signed)

## 2013-05-19 ENCOUNTER — Telehealth: Payer: Self-pay | Admitting: Orthopedic Surgery

## 2013-05-19 NOTE — Telephone Encounter (Signed)
Routing to Dr Harrison 

## 2013-05-19 NOTE — Telephone Encounter (Signed)
yes

## 2013-05-19 NOTE — Telephone Encounter (Signed)
Cecelia ByarsJalene Consuegra wants a prescription for Morphine 15 mg  Her # [phone]

## 2013-05-20 ENCOUNTER — Other Ambulatory Visit: Payer: Self-pay | Admitting: *Deleted

## 2013-05-20 DIAGNOSIS — M25569 Pain in unspecified knee: Secondary | ICD-10-CM

## 2013-05-20 MED ORDER — MORPHINE SULFATE ER 15 MG PO TBCR: 15.0000 mg | EXTENDED_RELEASE_TABLET | Freq: Two times a day (BID) | ORAL | Status: DC

## 2013-05-20 NOTE — Telephone Encounter (Signed)
Refilled per Dr. Harrison 

## 2013-05-29 ENCOUNTER — Other Ambulatory Visit: Payer: Self-pay

## 2013-05-29 MED ORDER — PANTOPRAZOLE SODIUM 40 MG PO TBEC: 40.0000 mg | DELAYED_RELEASE_TABLET | Freq: Two times a day (BID) | ORAL | Status: AC

## 2013-06-10 ENCOUNTER — Other Ambulatory Visit: Payer: Self-pay | Admitting: *Deleted

## 2013-06-10 DIAGNOSIS — M62838 Other muscle spasm: Secondary | ICD-10-CM

## 2013-06-10 MED ORDER — CYCLOBENZAPRINE HCL 10 MG PO TABS: 10.0000 mg | ORAL_TABLET | Freq: Three times a day (TID) | ORAL | Status: DC | PRN

## 2013-06-18 ENCOUNTER — Other Ambulatory Visit: Payer: Self-pay | Admitting: *Deleted

## 2013-06-18 ENCOUNTER — Telehealth: Payer: Self-pay | Admitting: Orthopedic Surgery

## 2013-06-18 DIAGNOSIS — M25569 Pain in unspecified knee: Secondary | ICD-10-CM

## 2013-06-18 MED ORDER — MORPHINE SULFATE ER 15 MG PO TBCR: 15.0000 mg | EXTENDED_RELEASE_TABLET | Freq: Two times a day (BID) | ORAL | Status: DC

## 2013-06-18 NOTE — Telephone Encounter (Signed)
yes

## 2013-06-18 NOTE — Telephone Encounter (Signed)
Routing to DR. Harrison  

## 2013-06-18 NOTE — Telephone Encounter (Signed)
Cecelia ByarsJalene Probasco wants a prescription for Morphine 15mg .  Said she will be out before the weekend. New # [phone]

## 2013-06-19 NOTE — Telephone Encounter (Signed)
Refilled and left message for patient to pick up. 

## 2013-07-01 ENCOUNTER — Ambulatory Visit (INDEPENDENT_AMBULATORY_CARE_PROVIDER_SITE_OTHER): Payer: Medicare Other

## 2013-07-01 ENCOUNTER — Ambulatory Visit (INDEPENDENT_AMBULATORY_CARE_PROVIDER_SITE_OTHER): Payer: Medicare Other | Admitting: Orthopedic Surgery

## 2013-07-01 VITALS — BP 127/87 | Ht 65.0 in | Wt 212.0 lb

## 2013-07-01 DIAGNOSIS — Z96659 Presence of unspecified artificial knee joint: Secondary | ICD-10-CM

## 2013-07-01 DIAGNOSIS — M25462 Effusion, left knee: Secondary | ICD-10-CM

## 2013-07-01 DIAGNOSIS — M25569 Pain in unspecified knee: Secondary | ICD-10-CM

## 2013-07-01 DIAGNOSIS — M25469 Effusion, unspecified knee: Secondary | ICD-10-CM

## 2013-07-01 DIAGNOSIS — Z96651 Presence of right artificial knee joint: Secondary | ICD-10-CM

## 2013-07-01 MED ORDER — MORPHINE SULFATE ER 15 MG PO TBCR: 15.0000 mg | EXTENDED_RELEASE_TABLET | Freq: Two times a day (BID) | ORAL | Status: DC

## 2013-07-01 NOTE — Patient Instructions (Signed)
You have received a steroid shot. 15% of patients experience increased pain at the injection site with in the next 24 hours. This is best treated with ice and tylenol extra strength 2 tabs every 8 hours. If you are still having pain please call the office.    

## 2013-07-01 NOTE — Progress Notes (Signed)
Patient ID: Katelyn Thompson, female   DOB: [birth year], 48 y.o.   MRN: [id number]  Chief Complaint  Patient presents with  . Follow-up    Yearly recheck on right knee replacement. DOS 07-01-12.    One year followup right total knee complicated by postop arthrofibrosis treated with manipulation she does regain flexion to 95. No problems with her right knee complains of chronic swelling left knee she's had multiple aspirations  Review of systems no neurologic complaints. Stiffness swelling left knee.  Vital signs:  BP 127/87  Ht 5\' 5"  (1.651 m)  Wt 212 lb (96.163 kg)  BMI 35.28 kg/m2  General the patient is well-developed and well-nourished grooming and hygiene are normal Oriented x3 Mood and affect normal Ambulation is not what I would consider normal she favors both legs actually pending which one is more comfortable at the time. Inspection of the right knee shows a well-healed incision 95 of flexion joint stability confirmed sagittal and coronal motor exam normal skin clean dry and intact incision healed nicely neurovascular exam normal  Left knee fusion flexion limited by pain and swelling motor exam normal skin clean dry and intact no swelling of the lower extremity at the ankle area with normal pulse and normal sensation  Left knee effusion aspiration  Right total knee stable  Knee  Injection and aspiration Procedure Note  Pre-operative Diagnosis: left knee oa, effusion  Post-operative Diagnosis: same  Indications: pain, swelling  Anesthesia: ethyl chloride   Procedure Details   Verbal consent was obtained for the procedure. Time out was completed.The joint was prepped with alcohol, followed by  Ethyl chloride spray and The 18-gauge needle was inserted into the joint via lateral approach and we aspirated approximately 50cc of clear yellow fluid  This was followed by the injection of 4ml 1% lidocaine and 1 ml of depomedrol  was then injected into the joint . The needle was  removed and the area cleansed and dressed.  Complications:  None; patient tolerated the procedure well.

## 2013-08-18 ENCOUNTER — Other Ambulatory Visit: Payer: Self-pay | Admitting: Orthopedic Surgery

## 2013-08-18 ENCOUNTER — Telehealth: Payer: Self-pay | Admitting: Orthopedic Surgery

## 2013-08-18 DIAGNOSIS — M25569 Pain in unspecified knee: Secondary | ICD-10-CM

## 2013-08-18 MED ORDER — MORPHINE SULFATE ER 15 MG PO TBCR: 15.0000 mg | EXTENDED_RELEASE_TABLET | Freq: Two times a day (BID) | ORAL | Status: DC

## 2013-08-18 NOTE — Telephone Encounter (Signed)
Katelyn Thompson wants a prescription for Morphine 15 mg Her # [phone]

## 2013-08-18 NOTE — Telephone Encounter (Signed)
Routing to Dr Harrison 

## 2013-08-18 NOTE — Telephone Encounter (Signed)
Left message prescription was ready to be picked up.

## 2013-09-23 ENCOUNTER — Ambulatory Visit (INDEPENDENT_AMBULATORY_CARE_PROVIDER_SITE_OTHER): Payer: Medicare Other | Admitting: Orthopedic Surgery

## 2013-09-23 ENCOUNTER — Encounter: Payer: Self-pay | Admitting: Orthopedic Surgery

## 2013-09-23 ENCOUNTER — Ambulatory Visit (INDEPENDENT_AMBULATORY_CARE_PROVIDER_SITE_OTHER): Payer: Medicare Other

## 2013-09-23 VITALS — BP 141/100 | Ht 65.0 in | Wt 212.0 lb

## 2013-09-23 DIAGNOSIS — S8000XA Contusion of unspecified knee, initial encounter: Secondary | ICD-10-CM

## 2013-09-23 DIAGNOSIS — M23329 Other meniscus derangements, posterior horn of medial meniscus, unspecified knee: Secondary | ICD-10-CM

## 2013-09-23 DIAGNOSIS — M25569 Pain in unspecified knee: Secondary | ICD-10-CM

## 2013-09-23 DIAGNOSIS — S8001XA Contusion of right knee, initial encounter: Secondary | ICD-10-CM

## 2013-09-23 DIAGNOSIS — M25561 Pain in right knee: Secondary | ICD-10-CM | POA: Insufficient documentation

## 2013-09-23 MED ORDER — DIAZEPAM 10 MG PO TABS: ORAL_TABLET | ORAL | Status: DC

## 2013-09-23 MED ORDER — MORPHINE SULFATE ER 15 MG PO TBCR: 15.0000 mg | EXTENDED_RELEASE_TABLET | Freq: Two times a day (BID) | ORAL | Status: DC

## 2013-09-23 NOTE — Progress Notes (Signed)
Patient ID: Katelyn Thompson, female   DOB: 1965/07/24, 48 y.o.   MRN: [id number]  Chief Complaint  Patient presents with  . Follow-up    left knee swelling and right knee pain// 06-2012 right tka and f/b MUA     History this is a 48 year old female had a right total knee back in 2013 followed by manipulation 3 months later in June of 2014 presents after her granddaughter bumped into her right knee and she underwent or had a hyperflexion injury. She also comes in with pain and swelling in her left knee no trauma. This is one of many and multiple recurrent episodes of effusion which have been previously treated with activity modification, ice, anti-inflammatories which she is no longer tolerant of, pain medications, injection of steroid and multiple aspirations. Mechanical symptoms include a catching popping sensation in the left knee.  X-rays show minimal arthritic changes in the left knee  Past Surgical History  Procedure Laterality Date  . S/p hysterectomy    . Tubal ligation    . Sinus surgery with instatrak    . Foot surgery      x 2  . Esophagogastroduodenoscopy  06/2010    diagnosed with Barrett's, small hh, esophagus dilated. Next EGD 06/2011  . Colonoscopy  11/20/2011    RMR: Friable anorectum; single anal canal hemorrhoidal tag. Otherwise normal rectum and colon  . Surgery of lip      biopsy  . Ankle arthroscopy with drilling/microfracture  02/26/2012    Procedure: ANKLE ARTHROSCOPY WITH DRILLING/MICROFRACTURE;  Surgeon: Vickki HearingStanley E Harrison, MD;  Location: [facility];  Service: Orthopedics;  Laterality: Right;  . Knee arthroscopy  02/26/2012    Procedure: ARTHROSCOPY KNEE;  Surgeon: Vickki HearingStanley E Harrison, MD;  Location: [facility];  Service: Orthopedics;  Laterality: Right;  with three chondroplasties  . Esophagogastroduodenoscopy  Aug 2013    RMR: abnormal distal esophagus consistent with prior dx of SS Barrett;s. s/p dilation. small hiatal hernia. Biopsies: no metaplasia, dysplasia, malignancy.  SURVEILLANCE AUG 2016  . Abdominal hysterectomy    . Total knee arthroplasty Right 07/01/2012    Procedure: TOTAL KNEE ARTHROPLASTY;  Surgeon: Vickki HearingStanley E Harrison, MD;  Location: [facility];  Service: Orthopedics;  Laterality: Right;  . Exam under anesthesia with manipulation of knee Right 09/16/2012    Procedure: EXAM UNDER ANESTHESIA WITH MANIPULATION OF RIGHT KNEE;  Surgeon: Vickki HearingStanley E Harrison, MD;  Location: [facility];  Service: Orthopedics;  Laterality: Right;    Review of systems no chest pain no shortness of breath no fever chills or fatigue no rashes over the knee or body to suggest systemic illness   Vital signs are stable as recorded BP 141/100  Ht 5\' 5"  (1.651 m)  Wt 212 lb (96.163 kg)  BMI 35.28 kg/m2   General appearance is normal, body habitus overweight  The patient is alert and oriented x 3  The patient's mood and affect are normal  Gait assessment: Antalgic gait left side  The cardiovascular exam reveals normal pulses and temperature without edema or  swelling.  The lymphatic system is negative for palpable lymph nodes  The sensory exam is normal.  There are no pathologic reflexes.  Balance is normal.   Exam of the left knee shows a moderate joint effusion with painful range of motion medial joint line tenderness without instability. Manual muscle testing grade 5 extension power. Skin normal, no rash, or laceration. Provocative tests McMurray is positive  Right knee tenderness over the anterolateral aspect of the right  knee with a stable knee in extension as well as flexion swelling and tenderness over the anterolateral knee    A/P X-rays were normal of the right knee previous x-rays left knee mild arthritis  Contusion right knee Derangement of posterior horn medial meniscus left knee with recurrent effusions  Aspiration injection left knee again  MRI left knee to evaluate persistent and recurrent effusions. Surgical intervention necessary to evaluate knee  arthroscopically as well.  Knee  Injection and aspiration Procedure Note  Pre-operative Diagnosis: left knee oa, effusion  Post-operative Diagnosis: same  Indications: pain, swelling  Anesthesia: ethyl chloride   Procedure Details   Verbal consent was obtained for the procedure. Time out was completed.The joint was prepped with alcohol, followed by  Ethyl chloride spray and The 18-gauge needle was inserted into the joint via lateral approach and we aspirated approximately 30 cc of clear yellow fluid  This was followed by the injection of 4ml 1% lidocaine and 1 ml of depomedrol  was then injected into the joint . The needle was removed and the area cleansed and dressed.  Complications:  None; patient tolerated the procedure well.

## 2013-09-23 NOTE — Patient Instructions (Signed)
MRI ordered left knee

## 2013-10-07 ENCOUNTER — Ambulatory Visit (HOSPITAL_COMMUNITY)
Admission: RE | Admit: 2013-10-07 | Discharge: 2013-10-07 | Disposition: A | Discharge status: Home / Self Care | Payer: Medicare Other | Source: Ambulatory Visit | Attending: Orthopedic Surgery | Admitting: Orthopedic Surgery

## 2013-10-07 DIAGNOSIS — M899 Disorder of bone, unspecified: Secondary | ICD-10-CM | POA: Insufficient documentation

## 2013-10-07 DIAGNOSIS — M25569 Pain in unspecified knee: Secondary | ICD-10-CM | POA: Insufficient documentation

## 2013-10-07 DIAGNOSIS — X58XXXA Exposure to other specified factors, initial encounter: Secondary | ICD-10-CM | POA: Insufficient documentation

## 2013-10-07 DIAGNOSIS — S83289A Other tear of lateral meniscus, current injury, unspecified knee, initial encounter: Secondary | ICD-10-CM | POA: Insufficient documentation

## 2013-10-07 DIAGNOSIS — M23329 Other meniscus derangements, posterior horn of medial meniscus, unspecified knee: Secondary | ICD-10-CM

## 2013-10-07 DIAGNOSIS — M949 Disorder of cartilage, unspecified: Secondary | ICD-10-CM

## 2013-10-27 ENCOUNTER — Ambulatory Visit (INDEPENDENT_AMBULATORY_CARE_PROVIDER_SITE_OTHER): Payer: Medicare Other | Admitting: Orthopedic Surgery

## 2013-10-27 VITALS — BP 139/96 | Ht 65.0 in | Wt 212.0 lb

## 2013-10-27 DIAGNOSIS — M25562 Pain in left knee: Secondary | ICD-10-CM

## 2013-10-27 DIAGNOSIS — M23301 Other meniscus derangements, unspecified lateral meniscus, left knee: Secondary | ICD-10-CM

## 2013-10-27 DIAGNOSIS — M23302 Other meniscus derangements, unspecified lateral meniscus, unspecified knee: Secondary | ICD-10-CM

## 2013-10-27 DIAGNOSIS — M1712 Unilateral primary osteoarthritis, left knee: Secondary | ICD-10-CM | POA: Insufficient documentation

## 2013-10-27 DIAGNOSIS — M25462 Effusion, left knee: Secondary | ICD-10-CM

## 2013-10-27 DIAGNOSIS — M1710 Unilateral primary osteoarthritis, unspecified knee: Secondary | ICD-10-CM

## 2013-10-27 DIAGNOSIS — M25469 Effusion, unspecified knee: Secondary | ICD-10-CM

## 2013-10-27 DIAGNOSIS — M25569 Pain in unspecified knee: Secondary | ICD-10-CM

## 2013-10-27 MED ORDER — MORPHINE SULFATE ER 15 MG PO TBCR: 15.0000 mg | EXTENDED_RELEASE_TABLET | Freq: Two times a day (BID) | ORAL | Status: DC

## 2013-10-27 NOTE — Patient Instructions (Addendum)
Please call office when you are ready to schedule surgery  Scheduled for arthroscopy of the knee  You've been scheduled for arthroscopic surgery of your knee.  All surgeries carry some risks. General risks of surgery include bleeding infection and blood clots.  Specific risks related to knee arthroscopy include continued pain, persistent swelling, mechanical symptoms of catching locking and giving way and persistent weakness in the knee.   If these risks are not worth taking please let me know so that we can consider other nonoperative treatments which include but are not limited to medication, injection, physical therapy.   Please stop all blood thinners and anti-inflammatories before surgery This includes but is not limited to Coumadin, Lovenox, warfarin, Motrin, ibuprofen, diclofenac, Ticlid, xarelto, Plavix.

## 2013-10-27 NOTE — Progress Notes (Signed)
Patient ID: Katelyn Thompson, female   DOB: Jul 16, 1965, 48 y.o.   MRN: [id number] Chief Complaint   Patient presents with   .  Follow-up       left knee swelling and right knee pain// 06-2012 right tka and f/b MUA      History this is a 48 year old female had a right total knee back in 2013 followed by manipulation 3 months later in June of 2014 presents after her granddaughter bumped into her right knee and she underwent or had a hyperflexion injury. She also comes in with pain and swelling in her left knee no trauma. This is one of many and multiple recurrent episodes of effusion which have been previously treated with activity modification, ice, anti-inflammatories which she is no longer tolerant of, pain medications, injection of steroid and multiple aspirations. Mechanical symptoms include a catching popping sensation in the left knee.  History as noted above  Review of systems pain right knee, repeat effusion left knee. No neurovascular complaints  MRI as noted below and reviewed.  Past Medical History  Diagnosis Date  . GERD (gastroesophageal reflux disease)   . COPD (chronic obstructive pulmonary disease)   . HTN (hypertension)   . Nerve damage     Severe to left foot  . Bipolar 1 disorder   . Anxiety and depression     Does not like being around lots of people  . Hypercholesteremia   . MS (multiple sclerosis)     but not offcially disgnosed  . Barrett's esophagus without dysplasia   . Arthritis   . PONV (postoperative nausea and vomiting)   . Depression   . Anxiety   . OA (osteoarthritis) of knee 01/31/2012     Patellofemoral: Focal area of full-thickness cartilage loss of the medial patellar facet with a small area of adjacent delamination   measuring 3.8 mm.   Medial: Cartilage irregularity of the medial femoral condyle and medial tibial plateau with small areas of full-thickness cartilage loss involving the medial femoral condyle.   Lateral: Cartilage  irregularity with high-grade partial thickness cartilage loss of the lateral femoral condyle and medial tibial plateau. There is an area of full-thickness cartilage loss of the lateral femoral condyle.  IMPRESSION:  1. Tricompartmental cartilage abnormality as described above.  2. Small vertical tear of the free edge of the body of the lateral  meniscus.  BP 139/96  Ht  (1.651 m)  Wt 212 lb (96.163 kg)  BMI 35.28 kg/m2 General appearance is normal, the patient is alert and oriented x3 with normal mood and affect. No significant gait disturbance.  Inspection left knee effusion. Blocks range of motion which is limited to 105. He remained stable. Diffuse tenderness. Motor exam is normal. Skin without rash or laceration. Pulses good sensation normal no lymphadenopathy.  Right knee tender painful  Chronic right knee pain after knee replacement and manipulation  Encounter Diagnoses  Name Primary?  . Effusion of knee joint, left Yes  . Primary osteoarthritis of left knee   . Meniscus, lateral, derangement, left     I discussed arthroscopic surgery left knee the patient like to think about it because she had so much difficulty with her right knee. She wants her pain medication refilled.  We also aspirated the left knee we injected it with cortisone. (IM shot for the pain in her right.)  Knee  Injection and aspiration Procedure Note  Pre-operative Diagnosis: left knee oa, effusion  Post-operative Diagnosis: same  Indications: pain, swelling  Anesthesia: ethyl chloride   Procedure Details   Verbal consent was obtained for the procedure. Time out was completed.The joint was prepped with alcohol, followed by  Ethyl chloride spray and The 18-gauge needle was inserted into the joint via lateral approach and we aspirated approximately 35 cc of clear yellow fluid  This was followed by the injection of 4ml 1% lidocaine and 1 ml of depomedrol  was then injected into the joint . The  needle was removed and the area cleansed and dressed.  Complications:  None; patient tolerated the procedure well.  Right knee pain after knee replacement. 40 mg IM Depo-Medrol injected right hip   Meds ordered this encounter  Medications  . morphine (MS CONTIN) 15 MG 12 hr tablet    Sig: Take 1 tablet (15 mg total) by mouth every 12 (twelve) hours.    Dispense:  56 tablet    Refill:  0

## 2013-10-29 ENCOUNTER — Telehealth: Payer: Self-pay | Admitting: Orthopedic Surgery

## 2013-10-29 NOTE — Telephone Encounter (Signed)
Advised patient that surgery is outpatient and out of work 4-6 weeks, patient requests surgery to be scheduled after August 1

## 2013-10-29 NOTE — Telephone Encounter (Addendum)
Sha Bourdon wants to know: 1.  Is the surgery Dr. Romeo Apple is talking about outpatient?  2.  How long will she be off her feet?

## 2013-10-31 ENCOUNTER — Other Ambulatory Visit: Payer: Self-pay | Admitting: *Deleted

## 2013-11-06 ENCOUNTER — Telehealth: Payer: Self-pay | Admitting: Orthopedic Surgery

## 2013-11-06 NOTE — Telephone Encounter (Signed)
Duplicate note

## 2013-11-06 NOTE — Telephone Encounter (Signed)
Rescheduled to this date

## 2013-11-06 NOTE — Telephone Encounter (Signed)
Reschedule to the state

## 2013-11-06 NOTE — Telephone Encounter (Signed)
Katelyn Thompson wants to postpone her surgery until 11/21/13. (Sorry if this is a duplicate note) Her # [phone]

## 2013-11-06 NOTE — Telephone Encounter (Signed)
Katelyn Thompson wants to know if she can postpone her surgery until 11/21/13

## 2013-11-10 ENCOUNTER — Other Ambulatory Visit: Payer: Self-pay

## 2013-11-10 ENCOUNTER — Encounter (HOSPITAL_COMMUNITY): Payer: Self-pay

## 2013-11-10 ENCOUNTER — Encounter (HOSPITAL_COMMUNITY): Payer: Self-pay | Admitting: Pharmacy Technician

## 2013-11-10 ENCOUNTER — Encounter (HOSPITAL_COMMUNITY)
Admission: RE | Admit: 2013-11-10 | Discharge: 2013-11-10 | Disposition: A | Discharge status: Home / Self Care | Payer: Medicare Other | Source: Ambulatory Visit | Attending: Orthopedic Surgery | Admitting: Orthopedic Surgery

## 2013-11-10 DIAGNOSIS — Z01818 Encounter for other preprocedural examination: Secondary | ICD-10-CM | POA: Diagnosis not present

## 2013-11-10 DIAGNOSIS — Z01810 Encounter for preprocedural cardiovascular examination: Secondary | ICD-10-CM | POA: Diagnosis not present

## 2013-11-10 LAB — BASIC METABOLIC PANEL
Anion gap: 12 (ref 5–15)
BUN: 5 mg/dL — ABNORMAL LOW (ref 6–23)
CHLORIDE: 102 meq/L (ref 96–112)
CO2: 28 mEq/L (ref 19–32)
Calcium: 9.3 mg/dL (ref 8.4–10.5)
Creatinine, Ser: 0.6 mg/dL (ref 0.50–1.10)
GFR calc Af Amer: >90 mL/min (ref >90)
GFR calc non Af Amer: >90 mL/min (ref >90)
GLUCOSE: 107 mg/dL — AB (ref 70–99)
POTASSIUM: 4.1 meq/L (ref 3.7–5.3)
Sodium: 142 mEq/L (ref 137–147)

## 2013-11-10 LAB — HEMOGLOBIN AND HEMATOCRIT, BLOOD
HCT: 45.6 % (ref 36.0–46.0)
Hemoglobin: 15.8 g/dL — ABNORMAL HIGH (ref 12.0–15.0)

## 2013-11-10 NOTE — Telephone Encounter (Signed)
Surgery rescheduled as requested, day surgery and patient aware

## 2013-11-10 NOTE — Pre-Procedure Instructions (Signed)
Patient given instructions with web site and code to sign up for "My Chart"  

## 2013-11-10 NOTE — Patient Instructions (Signed)
Your procedure is scheduled on: 11/14/2013  [facility] to [facility] at 6:15    AM.  Call this number if you have problems the morning of surgery: [phone]   Remember:   Do not drink or eat food:After Midnight.  :  Take these medicines the morning of surgery with A SIP OF WATER: Amlodipine, Tegretol, Dexilant, Lisinopril and use Albuterol inhaler   Do not wear jewelry, make-up or nail polish.  Do not wear lotions, powders, or perfumes. You may wear deodorant.  Do not shave 48 hours prior to surgery. Men may shave face and neck.  Do not bring valuables to the hospital.  Contacts, dentures or bridgework may not be worn into surgery.  Leave suitcase in the car. After surgery it may be brought to your room.  For patients admitted to the hospital, checkout time is 11:00 AM the day of discharge.   Patients discharged the day of surgery will not be allowed to drive home.    Special Instructions: Shower using CHG night before surgery and shower the day of surgery use CHG.  Use special wash - you have one bottle of CHG for all showers.  You should use approximately 1/2 of the bottle for each shower.   Please read over the following fact sheets that you were given: Pain Booklet, MRSA Information, Surgical Site Infection Prevention and Care and Recovery After Surgery  Arthroscopic Procedure, Knee, Care After Refer to this sheet in the next few weeks. These discharge instructions provide you with general information on caring for yourself after you leave the hospital. Your health care provider may also give you specific instructions. Your treatment has been planned according to the most current medical practices available, but unavoidable complications sometimes occur. If you have any problems or questions after discharge, please call your health care provider. HOME CARE INSTRUCTIONS   It is normal to be sore for a couple days after surgery. See your health care provider if this seems to be getting worse  rather than better.  Only take over-the-counter or prescription medicines for pain, discomfort, or fever as directed by your health care provider.  Take showers rather than baths, or as directed by your health care provider.  Change bandages (dressings) if necessary or as directed.  You may resume normal diet and activities as directed or allowed.  Avoid lifting and driving until you are directed otherwise.  Make an appointment to see your health care provider for stitches (suture) or staple removal as directed.  You may put ice on the area.  Put the ice in a plastic bag. Place a towel between your skin and the bag.  Leave the ice on for 15-20 minutes, three to four times per day for the first 2 days.  Elevate the knee above the level of your heart to reduce swelling, and avoid dangling the leg.  Do 10-15 ankle pumps (pointing your toes toward you and then away from you) two to three times daily.  If you are given compression stockings to wear after surgery, use them for as long as your surgeon tells you (around 10-14 days).  Avoid smoking and exposure to secondhand smoke. SEEK MEDICAL CARE IF:   You have increased bleeding from your wounds.  You see redness or swelling or you have increasing pain in your wounds.  You have pus coming from your wound.  You have a fever or persistent symptoms for more than 2-3 days.  You notice a bad smell coming from  the wound or dressing.  You have severe pain with any motion of your knee. SEEK IMMEDIATE MEDICAL CARE IF:   You develop a rash.  You have difficulty breathing.  You develop any reaction or side effects to medicines taken.  You develop pain in the calves or back of the knee.  You develop chest pain, shortness of breath, or difficulty breathing.  You develop numbness or tingling in the leg or foot. MAKE SURE YOU:   Understand these instructions.  Will watch your condition.  Will get help right away if you are not  doing well or you get worse. Document Released: 10/14/2004 Document Revised: 04/01/2013 Document Reviewed: 08/22/2012 [facility] Patient Information 2015 [place], [place]. This information is not intended to replace advice given to you by your health care provider. Make sure you discuss any questions you have with your health care provider. General Anesthesia, Care After Refer to this sheet in the next few weeks. These instructions provide you with information on caring for yourself after your procedure. Your health care provider may also give you more specific instructions. Your treatment has been planned according to current medical practices, but problems sometimes occur. Call your health care provider if you have any problems or questions after your procedure. WHAT TO EXPECT AFTER THE PROCEDURE After the procedure, it is typical to experience:  Sleepiness.  Nausea and vomiting. HOME CARE INSTRUCTIONS  For the first 24 hours after general anesthesia:  Have a responsible person with you.  Do not drive a car. If you are alone, do not take public transportation.  Do not drink alcohol.  Do not take medicine that has not been prescribed by your health care provider.  Do not sign important papers or make important decisions.  You may resume a normal diet and activities as directed by your health care provider.  Change bandages (dressings) as directed.  If you have questions or problems that seem related to general anesthesia, call the hospital and ask for the anesthetist or anesthesiologist on call. SEEK MEDICAL CARE IF:  You have nausea and vomiting that continue the day after anesthesia.  You develop a rash. SEEK IMMEDIATE MEDICAL CARE IF:   You have difficulty breathing.  You have chest pain.  You have any allergic problems. Document Released: 07/03/2000 Document Revised: 04/01/2013 Document Reviewed: 10/10/2012 [facility] Patient Information 2015 [place], [place]. This  information is not intended to replace advice given to you by your health care provider. Make sure you discuss any questions you have with your health care provider.

## 2013-11-17 ENCOUNTER — Ambulatory Visit: Payer: Medicare Other | Admitting: Orthopedic Surgery

## 2013-11-18 ENCOUNTER — Telehealth: Payer: Self-pay | Admitting: Orthopedic Surgery

## 2013-11-18 NOTE — Telephone Encounter (Signed)
ROUTING TO DR HARRISON 

## 2013-11-18 NOTE — Telephone Encounter (Signed)
That can be handled at the day of surgery

## 2013-11-19 NOTE — Telephone Encounter (Signed)
PATIENT AWARE

## 2013-11-20 NOTE — H&P (Signed)
Katelyn Thompson is an 48 y.o. female.   Chief Complaint: Pain left knee  HPI: 48 year-old female presented with pain swelling of the left knee had recurrent effusions and multiple aspirations injections with on again an MRI of her knee shows a lateral meniscal tear with multicompartment degenerative arthritis  She is unhappy with the current treatment plan of injections and aspirations we decided to go ahead and perform arthroscopic surgery on the left knee    Past Medical History  Diagnosis Date  . GERD (gastroesophageal reflux disease)   . COPD (chronic obstructive pulmonary disease)   . HTN (hypertension)   . Nerve damage     Severe to left foot  . Bipolar 1 disorder   . Anxiety and depression     Does not like being around lots of people  . Hypercholesteremia   . MS (multiple sclerosis)     but not offcially disgnosed  . Barrett's esophagus without dysplasia   . Arthritis   . PONV (postoperative nausea and vomiting)   . Depression   . Anxiety   . OA (osteoarthritis) of knee 01/31/2012    Past Surgical History  Procedure Laterality Date  . S/p hysterectomy    . Tubal ligation    . Sinus surgery with instatrak    . Foot surgery      x 2  . Esophagogastroduodenoscopy  06/2010    diagnosed with Barrett's, small hh, esophagus dilated. Next EGD 06/2011  . Colonoscopy  11/20/2011    RMR: Friable anorectum; single anal canal hemorrhoidal tag. Otherwise normal rectum and colon  . Surgery of lip      biopsy  . Ankle arthroscopy with drilling/microfracture  02/26/2012    Procedure: ANKLE ARTHROSCOPY WITH DRILLING/MICROFRACTURE;  Surgeon: Vickki HearingStanley E Harrison, MD;  Location: [facility];  Service: Orthopedics;  Laterality: Right;  . Knee arthroscopy  02/26/2012    Procedure: ARTHROSCOPY KNEE;  Surgeon: Vickki HearingStanley E Harrison, MD;  Location: [facility];  Service: Orthopedics;  Laterality: Right;  with three chondroplasties  . Esophagogastroduodenoscopy  Aug 2013    RMR: abnormal distal esophagus  consistent with prior dx of SS Barrett;s. s/p dilation. small hiatal hernia. Biopsies: no metaplasia, dysplasia, malignancy. SURVEILLANCE AUG 2016  . Abdominal hysterectomy    . Total knee arthroplasty Right 07/01/2012    Procedure: TOTAL KNEE ARTHROPLASTY;  Surgeon: Vickki HearingStanley E Harrison, MD;  Location: [facility];  Service: Orthopedics;  Laterality: Right;  . Exam under anesthesia with manipulation of knee Right 09/16/2012    Procedure: EXAM UNDER ANESTHESIA WITH MANIPULATION OF RIGHT KNEE;  Surgeon: Vickki HearingStanley E Harrison, MD;  Location: [facility];  Service: Orthopedics;  Laterality: Right;    Family History  Problem Relation Age of Onset  . Colon cancer Neg Hx   . Heart disease Mother   . Heart attack Brother 48    deceased  . Diabetes Mother   . Asthma Mother   . Lung disease Mother   . Arthritis Mother   . Allergies Mother    Social History:  reports that she quit smoking about 2 years ago. Her smoking use included Cigarettes. She has a 68 pack-year smoking history. She has never used smokeless tobacco. She reports that she drinks alcohol. She reports that she does not use illicit drugs.  Allergies:  Allergies  Allergen Reactions  . Aspirin Other (See Comments)    esophagus disease  . Nitrofurantoin Nausea And Vomiting  . Relafen [Nabumetone] Nausea And Vomiting    No prescriptions  prior to admission    No results found for this or any previous visit (from the past 48 hour(s)). No results found.  ROS Running pain no weight loss or vision disturbance no headache chest pain shortness of breath or bloody diarrhea. No frequency she does have joint pain swelling. Denies poor healing denies dizziness denies hallucinations easy bleeding excessive thirst denies environmental allergy problems  There were no vitals taken for this visit. Physical Exam  Stable vital signs appears normal she is oriented x3 mood and affect are normal she is altered gait and station segment left knee problem in  previous moderately successful right total knee.  Upper extremity exam  The right and left upper extremity:   Inspection and palpation revealed no abnormalities in the upper extremities.   Range of motion is full without contracture.  Motor exam is normal with grade 5 strength.  The joints are fully reduced without subluxation.  There is no atrophy or tremor and muscle tone is normal.  All joints are stable.   Right knee range of motion limited to about 85 of flexion and lacks 5 of extension. Otherwise normal  Left knee lateral joint line tenderness joint effusion passive flexion 110 knee stable strength normal skin intact pulse and temperature normal no edema no lymph node swelling normal sensation no pathologic reflexes normal coordination   Assessment/Plan Osteoarthritis left knee lateral meniscal tear  Arthroscopy left knee partial lateral meniscectomy  Katelyn Thompson 11/20/2013, 5:12 PM

## 2013-11-21 ENCOUNTER — Encounter (HOSPITAL_COMMUNITY)
Admission: RE | Disposition: A | Discharge status: Home / Self Care | Payer: Self-pay | Source: Ambulatory Visit | Attending: Orthopedic Surgery

## 2013-11-21 ENCOUNTER — Encounter (HOSPITAL_COMMUNITY): Payer: Self-pay | Admitting: *Deleted

## 2013-11-21 ENCOUNTER — Ambulatory Visit (HOSPITAL_COMMUNITY)
Admission: RE | Admit: 2013-11-21 | Discharge: 2013-11-21 | Disposition: A | Discharge status: Home / Self Care | Payer: Medicare Other | Source: Ambulatory Visit | Attending: Orthopedic Surgery | Admitting: Orthopedic Surgery

## 2013-11-21 ENCOUNTER — Encounter (HOSPITAL_COMMUNITY): Payer: Medicare Other | Admitting: Anesthesiology

## 2013-11-21 ENCOUNTER — Ambulatory Visit (HOSPITAL_COMMUNITY): Payer: Medicare Other | Admitting: Anesthesiology

## 2013-11-21 DIAGNOSIS — F329 Major depressive disorder, single episode, unspecified: Secondary | ICD-10-CM | POA: Diagnosis not present

## 2013-11-21 DIAGNOSIS — M1710 Unilateral primary osteoarthritis, unspecified knee: Secondary | ICD-10-CM

## 2013-11-21 DIAGNOSIS — M2240 Chondromalacia patellae, unspecified knee: Secondary | ICD-10-CM | POA: Insufficient documentation

## 2013-11-21 DIAGNOSIS — Z79899 Other long term (current) drug therapy: Secondary | ICD-10-CM | POA: Insufficient documentation

## 2013-11-21 DIAGNOSIS — J449 Chronic obstructive pulmonary disease, unspecified: Secondary | ICD-10-CM | POA: Insufficient documentation

## 2013-11-21 DIAGNOSIS — K219 Gastro-esophageal reflux disease without esophagitis: Secondary | ICD-10-CM | POA: Insufficient documentation

## 2013-11-21 DIAGNOSIS — J4489 Other specified chronic obstructive pulmonary disease: Secondary | ICD-10-CM | POA: Insufficient documentation

## 2013-11-21 DIAGNOSIS — M1712 Unilateral primary osteoarthritis, left knee: Secondary | ICD-10-CM

## 2013-11-21 DIAGNOSIS — Z90710 Acquired absence of both cervix and uterus: Secondary | ICD-10-CM | POA: Diagnosis not present

## 2013-11-21 DIAGNOSIS — F3289 Other specified depressive episodes: Secondary | ICD-10-CM | POA: Diagnosis not present

## 2013-11-21 DIAGNOSIS — Z87891 Personal history of nicotine dependence: Secondary | ICD-10-CM | POA: Insufficient documentation

## 2013-11-21 DIAGNOSIS — F411 Generalized anxiety disorder: Secondary | ICD-10-CM | POA: Diagnosis not present

## 2013-11-21 DIAGNOSIS — I10 Essential (primary) hypertension: Secondary | ICD-10-CM | POA: Diagnosis not present

## 2013-11-21 DIAGNOSIS — M25569 Pain in unspecified knee: Secondary | ICD-10-CM | POA: Diagnosis present

## 2013-11-21 DIAGNOSIS — S83289A Other tear of lateral meniscus, current injury, unspecified knee, initial encounter: Secondary | ICD-10-CM | POA: Diagnosis present

## 2013-11-21 HISTORY — PX: KNEE ARTHROTOMY: SHX5881

## 2013-11-21 SURGERY — ARTHROTOMY, KNEE
Anesthesia: Spinal | Site: Knee | Laterality: Left

## 2013-11-21 MED ORDER — DEXAMETHASONE SODIUM PHOSPHATE 4 MG/ML IJ SOLN
4.0000 mg | Freq: Once | INTRAMUSCULAR | Status: AC
  Administered 2013-11-21: 4 mg via INTRAVENOUS
  Filled 2013-11-21: qty 1

## 2013-11-21 MED ORDER — ONDANSETRON HCL 4 MG/2ML IJ SOLN
4.0000 mg | Freq: Once | INTRAMUSCULAR | Status: AC
  Administered 2013-11-21: 4 mg via INTRAVENOUS
  Filled 2013-11-21: qty 2

## 2013-11-21 MED ORDER — LIDOCAINE IN DEXTROSE 5-7.5 % IV SOLN
INTRAVENOUS | Status: DC | PRN
  Administered 2013-11-21: 100 mg via INTRATHECAL

## 2013-11-21 MED ORDER — OXYCODONE-ACETAMINOPHEN 5-325 MG PO TABS: 1.0000 | ORAL_TABLET | ORAL | Status: DC | PRN

## 2013-11-21 MED ORDER — PROPOFOL INFUSION 10 MG/ML OPTIME
INTRAVENOUS | Status: DC | PRN
  Administered 2013-11-21: 35 ug/kg/min via INTRAVENOUS

## 2013-11-21 MED ORDER — GLYCOPYRROLATE 0.2 MG/ML IJ SOLN
0.2000 mg | Freq: Once | INTRAMUSCULAR | Status: AC
  Administered 2013-11-21: 0.2 mg via INTRAVENOUS
  Filled 2013-11-21: qty 1

## 2013-11-21 MED ORDER — MIDAZOLAM HCL 2 MG/2ML IJ SOLN
1.0000 mg | INTRAMUSCULAR | Status: DC | PRN
  Administered 2013-11-21: 2 mg via INTRAVENOUS
  Filled 2013-11-21: qty 2

## 2013-11-21 MED ORDER — BUPIVACAINE-EPINEPHRINE (PF) 0.5% -1:200000 IJ SOLN
INTRAMUSCULAR | Status: DC | PRN
  Administered 2013-11-21: 60 mL

## 2013-11-21 MED ORDER — SODIUM CHLORIDE 0.9 % IR SOLN
Status: DC | PRN
  Administered 2013-11-21 (×3)

## 2013-11-21 MED ORDER — FENTANYL CITRATE 0.05 MG/ML IJ SOLN
INTRAMUSCULAR | Status: DC | PRN
  Administered 2013-11-21 (×3): 25 ug via INTRAVENOUS

## 2013-11-21 MED ORDER — PROPOFOL 10 MG/ML IV BOLUS
INTRAVENOUS | Status: AC
Start: 2013-11-21 — End: 2013-11-21
  Filled 2013-11-21: qty 20

## 2013-11-21 MED ORDER — FENTANYL CITRATE 0.05 MG/ML IJ SOLN
25.0000 ug | INTRAMUSCULAR | Status: AC
  Administered 2013-11-21 (×2): 25 ug via INTRAVENOUS
  Filled 2013-11-21: qty 2

## 2013-11-21 MED ORDER — LIDOCAINE HCL (PF) 1 % IJ SOLN
INTRAMUSCULAR | Status: AC
  Filled 2013-11-21: qty 5

## 2013-11-21 MED ORDER — LACTATED RINGERS IV SOLN
INTRAVENOUS | Status: DC
  Administered 2013-11-21: 07:00:00 via INTRAVENOUS

## 2013-11-21 MED ORDER — CEFAZOLIN SODIUM-DEXTROSE 2-3 GM-% IV SOLR
2.0000 g | INTRAVENOUS | Status: AC
Start: 2013-11-21 — End: 2013-11-21
  Administered 2013-11-21: 2 g via INTRAVENOUS
  Filled 2013-11-21: qty 50

## 2013-11-21 MED ORDER — MIDAZOLAM HCL 2 MG/2ML IJ SOLN
INTRAMUSCULAR | Status: AC
  Filled 2013-11-21: qty 2

## 2013-11-21 MED ORDER — FENTANYL CITRATE 0.05 MG/ML IJ SOLN: 25.0000 ug | INTRAMUSCULAR | Status: DC | PRN

## 2013-11-21 MED ORDER — ONDANSETRON HCL 4 MG/2ML IJ SOLN
4.0000 mg | Freq: Once | INTRAMUSCULAR | Status: AC | PRN
  Administered 2013-11-21: 4 mg via INTRAVENOUS
  Filled 2013-11-21: qty 2

## 2013-11-21 MED ORDER — EPHEDRINE SULFATE 50 MG/ML IJ SOLN
INTRAMUSCULAR | Status: AC
  Filled 2013-11-21: qty 1

## 2013-11-21 MED ORDER — EPINEPHRINE HCL 1 MG/ML IJ SOLN
INTRAMUSCULAR | Status: AC
  Filled 2013-11-21: qty 5

## 2013-11-21 MED ORDER — CHLORHEXIDINE GLUCONATE 4 % EX LIQD: 60.0000 mL | Freq: Once | CUTANEOUS | Status: DC

## 2013-11-21 MED ORDER — LIDOCAINE IN DEXTROSE 5-7.5 % IV SOLN
INTRAVENOUS | Status: AC
  Filled 2013-11-21: qty 2

## 2013-11-21 MED ORDER — FENTANYL CITRATE 0.05 MG/ML IJ SOLN
INTRAMUSCULAR | Status: DC | PRN
  Administered 2013-11-21: 25 ug via INTRATHECAL

## 2013-11-21 MED ORDER — MIDAZOLAM HCL 5 MG/5ML IJ SOLN
INTRAMUSCULAR | Status: DC | PRN
  Administered 2013-11-21: 2 mg via INTRAVENOUS

## 2013-11-21 MED ORDER — ALBUTEROL SULFATE (2.5 MG/3ML) 0.083% IN NEBU
2.5000 mg | INHALATION_SOLUTION | Freq: Once | RESPIRATORY_TRACT | Status: AC
  Administered 2013-11-21: 2.5 mg via RESPIRATORY_TRACT

## 2013-11-21 MED ORDER — SODIUM CHLORIDE 0.9 % IJ SOLN
INTRAMUSCULAR | Status: AC
  Filled 2013-11-21: qty 10

## 2013-11-21 MED ORDER — EPHEDRINE SULFATE 50 MG/ML IJ SOLN
INTRAMUSCULAR | Status: DC | PRN
  Administered 2013-11-21: 10 mg via INTRAVENOUS

## 2013-11-21 MED ORDER — PROPOFOL 10 MG/ML IV BOLUS
INTRAVENOUS | Status: AC
  Filled 2013-11-21: qty 20

## 2013-11-21 MED ORDER — MORPHINE SULFATE ER 15 MG PO TBCR
15.0000 mg | EXTENDED_RELEASE_TABLET | Freq: Once | ORAL | Status: AC
  Administered 2013-11-21: 15 mg via ORAL
  Filled 2013-11-21: qty 1

## 2013-11-21 MED ORDER — ALBUTEROL SULFATE (2.5 MG/3ML) 0.083% IN NEBU
INHALATION_SOLUTION | RESPIRATORY_TRACT | Status: AC
  Filled 2013-11-21: qty 3

## 2013-11-21 MED ORDER — BUPIVACAINE-EPINEPHRINE (PF) 0.5% -1:200000 IJ SOLN
INTRAMUSCULAR | Status: AC
  Filled 2013-11-21: qty 60

## 2013-11-21 MED ORDER — 0.9 % SODIUM CHLORIDE (POUR BTL) OPTIME
TOPICAL | Status: DC | PRN
  Administered 2013-11-21: 1000 mL

## 2013-11-21 MED ORDER — FENTANYL CITRATE 0.05 MG/ML IJ SOLN
INTRAMUSCULAR | Status: AC
  Filled 2013-11-21: qty 2

## 2013-11-21 SURGICAL SUPPLY — 63 items
ARTHROWAND PARAGON T2 (SURGICAL WAND)
BAG HAMPER (MISCELLANEOUS) ×4 IMPLANT
BANDAGE ELASTIC 6 VELCRO NS (GAUZE/BANDAGES/DRESSINGS) ×4 IMPLANT
BIT DRILL 2.0MX128MM (BIT) ×4 IMPLANT
BLADE 11 SAFETY STRL DISP (BLADE) ×4 IMPLANT
BLADE AGGRESSIVE PLUS 4.0 (BLADE) ×4 IMPLANT
CHLORAPREP W/TINT 26ML (MISCELLANEOUS) ×8 IMPLANT
CLOTH BEACON ORANGE TIMEOUT ST (SAFETY) ×4 IMPLANT
COOLER CRYO IC GRAV AND TUBE (ORTHOPEDIC SUPPLIES) ×4 IMPLANT
COVER PROBE W GEL 5X96 (DRAPES) ×1 IMPLANT
CUFF CRYO KNEE LG 20X31 COOLER (ORTHOPEDIC SUPPLIES) IMPLANT
CUFF CRYO KNEE18X23 MED (MISCELLANEOUS) ×3 IMPLANT
CUFF TOURNIQUET SINGLE 34IN LL (TOURNIQUET CUFF) ×3 IMPLANT
CUFF TOURNIQUET SINGLE 44IN (TOURNIQUET CUFF) IMPLANT
CUTTER ANGLED DBL BITE 4.5 (BURR) IMPLANT
DECANTER SPIKE VIAL GLASS SM (MISCELLANEOUS) ×8 IMPLANT
DRSG XEROFORM 1X8 (GAUZE/BANDAGES/DRESSINGS) ×3 IMPLANT
ELECT REM PT RETURN 9FT ADLT (ELECTROSURGICAL) ×4
ELECTRODE REM PT RTRN 9FT ADLT (ELECTROSURGICAL) ×1 IMPLANT
GAUZE SPONGE 4X4 12PLY STRL (GAUZE/BANDAGES/DRESSINGS) ×4 IMPLANT
GAUZE SPONGE 4X4 12PLY STRL LF (GAUZE/BANDAGES/DRESSINGS) ×3 IMPLANT
GAUZE SPONGE 4X4 16PLY XRAY LF (GAUZE/BANDAGES/DRESSINGS) ×4 IMPLANT
GAUZE XEROFORM 5X9 LF (GAUZE/BANDAGES/DRESSINGS) ×4 IMPLANT
GLOVE BIOGEL PI IND STRL 7.5 (GLOVE) ×1 IMPLANT
GLOVE BIOGEL PI INDICATOR 7.5 (GLOVE) ×2
GLOVE ECLIPSE 7.5 STRL STRAW (GLOVE) ×3 IMPLANT
GLOVE SKINSENSE NS SZ8.0 LF (GLOVE) ×2
GLOVE SKINSENSE STRL SZ8.0 LF (GLOVE) ×2 IMPLANT
GLOVE SS N UNI LF 8.5 STRL (GLOVE) ×4 IMPLANT
GOWN PREVENTION PLUS XLARGE (GOWN DISPOSABLE) ×4 IMPLANT
GOWN STRL REUS W/TWL LRG LVL3 (GOWN DISPOSABLE) ×8 IMPLANT
GOWN STRL REUS W/TWL XL LVL3 (GOWN DISPOSABLE) ×1 IMPLANT
HLDR LEG FOAM (MISCELLANEOUS) ×2 IMPLANT
IV NS IRRIG 3000ML ARTHROMATIC (IV SOLUTION) ×11 IMPLANT
KIT BLADEGUARD II DBL (SET/KITS/TRAYS/PACK) ×4 IMPLANT
KIT ROOM TURNOVER AP CYSTO (KITS) ×4 IMPLANT
LEG HOLDER FOAM (MISCELLANEOUS) ×2
MANIFOLD NEPTUNE II (INSTRUMENTS) ×4 IMPLANT
MARKER SKIN DUAL TIP RULER LAB (MISCELLANEOUS) ×4 IMPLANT
NDL HYPO 18GX1.5 BLUNT FILL (NEEDLE) ×1 IMPLANT
NDL HYPO 21X1.5 SAFETY (NEEDLE) ×1 IMPLANT
NDL SPNL 18GX3.5 QUINCKE PK (NEEDLE) ×1 IMPLANT
NEEDLE HYPO 18GX1.5 BLUNT FILL (NEEDLE) ×4 IMPLANT
NEEDLE HYPO 21X1.5 SAFETY (NEEDLE) ×4 IMPLANT
NEEDLE SPNL 18GX3.5 QUINCKE PK (NEEDLE) ×4 IMPLANT
NS IRRIG 1000ML POUR BTL (IV SOLUTION) ×4 IMPLANT
PACK ARTHRO LIMB DRAPE STRL (MISCELLANEOUS) ×4 IMPLANT
PAD ABD 5X9 TENDERSORB (GAUZE/BANDAGES/DRESSINGS) ×4 IMPLANT
PAD ABD 8X10 STRL (GAUZE/BANDAGES/DRESSINGS) ×3 IMPLANT
PAD ARMBOARD 7.5X6 YLW CONV (MISCELLANEOUS) ×4 IMPLANT
PADDING CAST COTTON 6X4 STRL (CAST SUPPLIES) ×4 IMPLANT
PADDING WEBRIL 6 STERILE (GAUZE/BANDAGES/DRESSINGS) ×3 IMPLANT
SET ARTHROSCOPY INST (INSTRUMENTS) ×4 IMPLANT
SET ARTHROSCOPY PUMP TUBE (IRRIGATION / IRRIGATOR) ×4 IMPLANT
SET BASIN LINEN APH (SET/KITS/TRAYS/PACK) ×4 IMPLANT
SUT ETHILON 3 0 FSL (SUTURE) ×4 IMPLANT
SYR 30ML LL (SYRINGE) ×4 IMPLANT
SYRINGE 12CC LL (MISCELLANEOUS) ×4 IMPLANT
WAND 50 DEG COVAC W/CORD (SURGICAL WAND) IMPLANT
WAND 90 DEG TURBOVAC W/CORD (SURGICAL WAND) ×3 IMPLANT
WAND ARTHRO PARAGON T2 (SURGICAL WAND) IMPLANT
WATER STERILE IRR 1000ML POUR (IV SOLUTION) ×4 IMPLANT
YANKAUER SUCT BULB TIP 10FT TU (MISCELLANEOUS) ×12 IMPLANT

## 2013-11-21 NOTE — Anesthesia Preprocedure Evaluation (Signed)
Anesthesia Evaluation  Patient identified by MRN, date of birth, ID band Patient awake    Reviewed: Allergy & Precautions, H&P , NPO status   History of Anesthesia Complications (+) PONV and history of anesthetic complications  Airway Mallampati: II    Mouth opening: Limited Mouth Opening  Dental  (+) Teeth Intact   Pulmonary COPD COPD inhaler, former smoker,    + decreased breath sounds+ wheezing      Cardiovascular hypertension, Pt. on medications Rhythm:Regular Rate:Normal     Neuro/Psych PSYCHIATRIC DISORDERS Anxiety Depression    GI/Hepatic GERD-  Medicated and Poorly Controlled,  Endo/Other  Morbid obesity  Renal/GU      Musculoskeletal   Abdominal   Peds  Hematology   Anesthesia Other Findings   Reproductive/Obstetrics negative OB ROS                           Anesthesia Physical Anesthesia Plan  ASA: III  Anesthesia Plan: Spinal   Post-op Pain Management:    Induction:   Airway Management Planned: Nasal Cannula  Additional Equipment:   Intra-op Plan:   Post-operative Plan:   Informed Consent: I have reviewed the patients History and Physical, chart, labs and discussed the procedure including the risks, benefits and alternatives for the proposed anesthesia with the patient or authorized representative who has indicated his/her understanding and acceptance.     Plan Discussed with: CRNA  Anesthesia Plan Comments: (Albuterol jet neb pre-op.)        Anesthesia Quick Evaluation

## 2013-11-21 NOTE — Anesthesia Procedure Notes (Addendum)
Spinal  Patient location during procedure: OR Start time: 11/21/2013 7:47 AM Staffing CRNA/Resident: Joycelyn ManIDACAVAGE, Bev Drennen J Preanesthetic Checklist Completed: patient identified, site marked, surgical consent, pre-op evaluation, timeout performed, IV checked, risks and benefits discussed and monitors and equipment checked Spinal Block Patient position: left lateral decubitus Prep: Betadine Patient monitoring: heart rate, cardiac monitor, continuous pulse ox and blood pressure Approach: midline Location: L3-4 Injection technique: single-shot Needle Needle type: Spinocan  Needle length: 5 cm Assessment Sensory level: T10  #[id number]     12/2014      CSF brisk and clear

## 2013-11-21 NOTE — Discharge Instructions (Signed)
Arthroscopic Procedure, Knee, Care After °Refer to this sheet in the next few weeks. These discharge instructions provide you with general information on caring for yourself after you leave the hospital. Your health care provider may also give you specific instructions. Your treatment has been planned according to the most current medical practices available, but unavoidable complications sometimes occur. If you have any problems or questions after discharge, please call your health care provider. °HOME CARE INSTRUCTIONS  °· It is normal to be sore for a couple days after surgery. See your health care provider if this seems to be getting worse rather than better. °· Only take over-the-counter or prescription medicines for pain, discomfort, or fever as directed by your health care provider. °· Take showers rather than baths, or as directed by your health care provider. °· Change bandages (dressings) if necessary or as directed. °· You may resume normal diet and activities as directed or allowed. °· Avoid lifting and driving until you are directed otherwise. °· Make an appointment to see your health care provider for stitches (suture) or staple removal as directed. °· You may put ice on the area. °· Put the ice in a plastic bag. Place a towel between your skin and the bag. °· Leave the ice on for 15-20 minutes, three to four times per day for the first 2 days. °· Elevate the knee above the level of your heart to reduce swelling, and avoid dangling the leg. °· Do 10-15 ankle pumps (pointing your toes toward you and then away from you) two to three times daily. °· If you are given compression stockings to wear after surgery, use them for as long as your surgeon tells you (around 10-14 days). °· Avoid smoking and exposure to secondhand smoke. °SEEK MEDICAL CARE IF:  °· You have increased bleeding from your wounds. °· You see redness or swelling or you have increasing pain in your wounds. °· You have pus coming from your  wound. °· You have a fever or persistent symptoms for more than 2-3 days. °· You notice a bad smell coming from the wound or dressing. °· You have severe pain with any motion of your knee. °SEEK IMMEDIATE MEDICAL CARE IF:  °· You develop a rash. °· You have difficulty breathing. °· You develop any reaction or side effects to medicines taken. °· You develop pain in the calves or back of the knee. °· You develop chest pain, shortness of breath, or difficulty breathing. °· You develop numbness or tingling in the leg or foot. °MAKE SURE YOU:  °· Understand these instructions. °· Will watch your condition. °· Will get help right away if you are not doing well or you get worse. °Document Released: 10/14/2004 Document Revised: 04/01/2013 Document Reviewed: 08/22/2012 °[employer]® Patient Information ©2015 [employer]. This information is not intended to replace advice given to you by your health care provider. Make sure you discuss any questions you have with your health care provider. ° °

## 2013-11-21 NOTE — Op Note (Signed)
11/21/2013  8:42 AM  PATIENT:  Katelyn Thompson  48 y.o. 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   Delay start of Pharmacological VTE agent (>24hrs) due to surgical blood loss or risk of bleeding: not applicable  Details of procedure  Patient is reevaluated in the preoperative area. Left knee confirmed as a surgical site marked. Chart review completed. Patient taken to operating room for spinal anesthesia. Placed supine on the operating table. Left leg placed in an arthroscopic leg holder. Left leg prepped and draped with DuraPrep.  And timeout completed.  Lateral and medial portals were established. The scope was placed into the knee joint. Diagnostic arthroscopy was completed with circumferential viewing of the joint. A chondroplasty was performed of the medial femoral condyle. Lateral femoral condyle. Trochlea and patella. Use a combination of arthroscopic shavers and ArthroCare 90  wand. Synovitis were performed to improve visualization were needed.  Thorough irrigation was performed and the wash mode of the arthroscopic pump. The joint was injected with Marcaine with epinephrine 60 cc  3-0 nylon sutures were placed in the arthroscopic portal.  The patient returned to recovery in stable condition to be discharged    

## 2013-11-21 NOTE — Interval H&P Note (Signed)
History and Physical Interval Note:  11/21/2013 7:23 AM  Katelyn Thompson  has presented today for surgery, with the diagnosis of left lateral meniscus tear  The various methods of treatment have been discussed with the patient and family. After consideration of risks, benefits and other options for treatment, the patient has consented to  Procedure(s): KNEE ARTHROSCOPY WITH LATERAL MENISECTOMY (Left) as a surgical intervention .  The patient's history has been reviewed, patient examined, no change in status, stable for surgery.  I have reviewed the patient's chart and labs.  Questions were answered to the patient's satisfaction.     Fuller Canada

## 2013-11-21 NOTE — Brief Op Note (Addendum)
11/21/2013  8:42 AM  PATIENT:  Katelyn Thompson  48 y.o. 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   Delay start of Pharmacological VTE agent (>24hrs) due to surgical blood loss or risk of bleeding: not applicable  Details of procedure  Patient is reevaluated in the preoperative area. Left knee confirmed as a surgical site marked. Chart review completed. Patient taken to operating room for spinal anesthesia. Placed supine on the operating table. Left leg placed in an arthroscopic leg holder. Left leg prepped and draped with DuraPrep.  And timeout completed.  Lateral and medial portals were established. The scope was placed into the knee joint. Diagnostic arthroscopy was completed with circumferential viewing of the joint. A chondroplasty was performed of the medial femoral condyle. Lateral femoral condyle. Trochlea and patella. Use a combination of arthroscopic shavers and ArthroCare 90  wand. Synovitis were performed to improve visualization were needed.  Thorough irrigation was performed and the wash mode of the arthroscopic pump. The joint was injected with Marcaine with epinephrine 60 cc  3-0 nylon sutures were placed in the arthroscopic portal.  The patient returned to recovery in stable condition to be discharged

## 2013-11-21 NOTE — Anesthesia Postprocedure Evaluation (Signed)
  Anesthesia Post-op Note  Patient: Katelyn Thompson  Procedure(s) Performed: Procedure(s): KNEE ARTHROSCOPY  WITH MEDIAL, LATERAL, AND PATELLA  FEMORAL CHONDROPLASTY (Left)  Patient Location: PACU  Anesthesia Type:Spinal  Level of Consciousness: awake, alert , oriented and patient cooperative  Airway and Oxygen Therapy: Patient Spontanous Breathing  Post-op Pain: 3 /10, mild  Post-op Assessment: Post-op Vital signs reviewed, Patient's Cardiovascular Status Stable, Respiratory Function Stable, Patent Airway and Pain level controlled  Post-op Vital Signs: Reviewed and stable  Last Vitals:  Filed Vitals:   11/21/13 0639  BP: 129/83  Pulse: 88  Temp: 36.7 C    Complications: No apparent anesthesia complications

## 2013-11-21 NOTE — Transfer of Care (Signed)
Immediate Anesthesia Transfer of Care Note  Patient: Katelyn Thompson  Procedure(s) Performed: Procedure(s): KNEE ARTHROSCOPY  WITH MEDIAL, LATERAL, AND PATELLA  FEMORAL CHONDROPLASTY (Left)  Patient Location: PACU  Anesthesia Type:Spinal  Level of Consciousness: awake and patient cooperative  Airway & Oxygen Therapy: Patient Spontanous Breathing and Patient connected to face mask oxygen  Post-op Assessment: Report given to PACU RN and Post -op Vital signs reviewed and stable  Post vital signs: Reviewed and stable  Complications: No apparent anesthesia complications

## 2013-11-24 ENCOUNTER — Encounter (HOSPITAL_COMMUNITY): Payer: Self-pay | Admitting: Orthopedic Surgery

## 2013-11-25 ENCOUNTER — Ambulatory Visit (INDEPENDENT_AMBULATORY_CARE_PROVIDER_SITE_OTHER): Payer: Self-pay | Admitting: Orthopedic Surgery

## 2013-11-25 VITALS — BP 107/74 | Ht 64.0 in | Wt 210.0 lb

## 2013-11-25 DIAGNOSIS — Z9889 Other specified postprocedural states: Secondary | ICD-10-CM

## 2013-11-25 DIAGNOSIS — M25562 Pain in left knee: Secondary | ICD-10-CM

## 2013-11-25 DIAGNOSIS — M25569 Pain in unspecified knee: Secondary | ICD-10-CM

## 2013-11-25 MED ORDER — MORPHINE SULFATE ER 15 MG PO TBCR
15.0000 mg | EXTENDED_RELEASE_TABLET | Freq: Two times a day (BID) | ORAL | Status: DC
Start: 2013-11-25 — End: 2014-01-26

## 2013-11-25 NOTE — Progress Notes (Signed)
Chief Complaint  Patient presents with  . Follow-up    post op #1, SALK DOS 11/21/13    Issues at this point doing well we will refill her pain medicine. Her portal sites look good we took the sutures out. Her knee flexion is about 80 she is walking with a walker. Followup in 4 weeks she should start therapy twice a week for 4 weeks

## 2013-11-25 NOTE — Patient Instructions (Signed)
Call to arrange therapy 

## 2013-12-01 ENCOUNTER — Other Ambulatory Visit: Payer: Self-pay | Admitting: *Deleted

## 2013-12-01 ENCOUNTER — Telehealth: Payer: Self-pay | Admitting: Orthopedic Surgery

## 2013-12-01 MED ORDER — OXYCODONE-ACETAMINOPHEN 5-325 MG PO TABS: 1.0000 | ORAL_TABLET | ORAL | Status: DC | PRN

## 2013-12-01 NOTE — Telephone Encounter (Signed)
Katelyn Thompson wants a prescription for Oxycodone 5/325

## 2013-12-01 NOTE — Telephone Encounter (Signed)
Prescription available and patient aware 

## 2013-12-02 ENCOUNTER — Ambulatory Visit: Payer: Medicare Other | Attending: Orthopedic Surgery | Admitting: Physical Therapy

## 2013-12-02 DIAGNOSIS — M25669 Stiffness of unspecified knee, not elsewhere classified: Secondary | ICD-10-CM | POA: Insufficient documentation

## 2013-12-02 DIAGNOSIS — R262 Difficulty in walking, not elsewhere classified: Secondary | ICD-10-CM | POA: Diagnosis not present

## 2013-12-02 DIAGNOSIS — IMO0001 Reserved for inherently not codable concepts without codable children: Secondary | ICD-10-CM | POA: Insufficient documentation

## 2013-12-02 DIAGNOSIS — M25569 Pain in unspecified knee: Secondary | ICD-10-CM | POA: Insufficient documentation

## 2013-12-02 DIAGNOSIS — R5381 Other malaise: Secondary | ICD-10-CM | POA: Insufficient documentation

## 2013-12-04 ENCOUNTER — Ambulatory Visit: Payer: Medicare Other | Admitting: *Deleted

## 2013-12-04 DIAGNOSIS — IMO0001 Reserved for inherently not codable concepts without codable children: Secondary | ICD-10-CM | POA: Diagnosis not present

## 2013-12-09 ENCOUNTER — Ambulatory Visit: Payer: Medicare Other | Attending: Orthopedic Surgery | Admitting: Physical Therapy

## 2013-12-09 DIAGNOSIS — R5381 Other malaise: Secondary | ICD-10-CM | POA: Diagnosis not present

## 2013-12-09 DIAGNOSIS — M899 Disorder of bone, unspecified: Secondary | ICD-10-CM | POA: Insufficient documentation

## 2013-12-09 DIAGNOSIS — IMO0001 Reserved for inherently not codable concepts without codable children: Secondary | ICD-10-CM | POA: Insufficient documentation

## 2013-12-09 DIAGNOSIS — M1990 Unspecified osteoarthritis, unspecified site: Secondary | ICD-10-CM | POA: Diagnosis not present

## 2013-12-09 DIAGNOSIS — M949 Disorder of cartilage, unspecified: Secondary | ICD-10-CM

## 2013-12-09 DIAGNOSIS — G309 Alzheimer's disease, unspecified: Secondary | ICD-10-CM | POA: Diagnosis not present

## 2013-12-09 DIAGNOSIS — M25669 Stiffness of unspecified knee, not elsewhere classified: Secondary | ICD-10-CM | POA: Insufficient documentation

## 2013-12-09 DIAGNOSIS — M25569 Pain in unspecified knee: Secondary | ICD-10-CM | POA: Diagnosis not present

## 2013-12-09 DIAGNOSIS — F0280 Dementia in other diseases classified elsewhere without behavioral disturbance: Secondary | ICD-10-CM | POA: Insufficient documentation

## 2013-12-10 ENCOUNTER — Telehealth: Payer: Self-pay | Admitting: Orthopedic Surgery

## 2013-12-10 ENCOUNTER — Other Ambulatory Visit: Payer: Self-pay | Admitting: *Deleted

## 2013-12-10 MED ORDER — OXYCODONE-ACETAMINOPHEN 5-325 MG PO TABS: 1.0000 | ORAL_TABLET | ORAL | Status: DC | PRN

## 2013-12-10 NOTE — Telephone Encounter (Signed)
Katelyn Thompson wants a prescription for Oxycodone 5/325 °

## 2013-12-11 ENCOUNTER — Ambulatory Visit: Payer: Medicare Other | Admitting: Physical Therapy

## 2013-12-11 DIAGNOSIS — IMO0001 Reserved for inherently not codable concepts without codable children: Secondary | ICD-10-CM | POA: Diagnosis not present

## 2013-12-11 NOTE — Telephone Encounter (Signed)
Prescription available for pick up, patient aware 

## 2013-12-16 ENCOUNTER — Encounter: Payer: Medicare Other | Admitting: Physical Therapy

## 2013-12-19 ENCOUNTER — Ambulatory Visit: Payer: Medicare Other | Admitting: Physical Therapy

## 2013-12-19 DIAGNOSIS — IMO0001 Reserved for inherently not codable concepts without codable children: Secondary | ICD-10-CM | POA: Diagnosis not present

## 2013-12-23 ENCOUNTER — Encounter: Payer: Medicare Other | Admitting: Physical Therapy

## 2013-12-24 ENCOUNTER — Ambulatory Visit: Payer: Medicare Other | Admitting: Physical Therapy

## 2013-12-24 DIAGNOSIS — IMO0001 Reserved for inherently not codable concepts without codable children: Secondary | ICD-10-CM | POA: Diagnosis not present

## 2013-12-25 ENCOUNTER — Encounter: Payer: Self-pay | Admitting: Orthopedic Surgery

## 2013-12-25 ENCOUNTER — Ambulatory Visit (INDEPENDENT_AMBULATORY_CARE_PROVIDER_SITE_OTHER): Payer: Self-pay | Admitting: Orthopedic Surgery

## 2013-12-25 VITALS — BP 131/91 | Ht 64.0 in | Wt 210.0 lb

## 2013-12-25 DIAGNOSIS — M25569 Pain in unspecified knee: Secondary | ICD-10-CM

## 2013-12-25 DIAGNOSIS — M25562 Pain in left knee: Secondary | ICD-10-CM

## 2013-12-25 DIAGNOSIS — Z9889 Other specified postprocedural states: Secondary | ICD-10-CM

## 2013-12-25 DIAGNOSIS — M25561 Pain in right knee: Secondary | ICD-10-CM

## 2013-12-25 MED ORDER — OXYCODONE-ACETAMINOPHEN 5-325 MG PO TABS: 1.0000 | ORAL_TABLET | ORAL | Status: DC | PRN

## 2013-12-25 MED ORDER — MORPHINE SULFATE ER 15 MG PO TBCR: 15.0000 mg | EXTENDED_RELEASE_TABLET | Freq: Two times a day (BID) | ORAL | Status: AC

## 2013-12-25 NOTE — Patient Instructions (Signed)
Home exercises  

## 2013-12-25 NOTE — Progress Notes (Signed)
Postop appointment  Status post left knee arthroscopy on August 14. She's doing well. She's finished her physical therapy regained full range of motion his mild discomfort  Her knee looks good she has no effusion she has full range of motion excellent quadriceps strength on straight leg raise testing she ablated assistive device  Addendum someone pushed on her right knee which is contracted and has flexion only to 90 status post total knee is stable to full extension stable collateral ligament stable on the anteroposterior plane just stretching of the knee nothing needs to be done  Followup routine for right total knee x-rays yearly otherwise followup as needed left knee.

## 2014-01-26 ENCOUNTER — Telehealth: Payer: Self-pay | Admitting: Orthopedic Surgery

## 2014-01-26 ENCOUNTER — Other Ambulatory Visit: Payer: Self-pay | Admitting: *Deleted

## 2014-01-26 DIAGNOSIS — M25562 Pain in left knee: Secondary | ICD-10-CM

## 2014-01-26 MED ORDER — MORPHINE SULFATE ER 15 MG PO TBCR: 15.0000 mg | EXTENDED_RELEASE_TABLET | Freq: Two times a day (BID) | ORAL | Status: DC

## 2014-01-26 MED ORDER — OXYCODONE-ACETAMINOPHEN 5-325 MG PO TABS: 1.0000 | ORAL_TABLET | ORAL | Status: DC | PRN

## 2014-01-26 NOTE — Telephone Encounter (Signed)
Prescription available, patient aware  

## 2014-01-26 NOTE — Telephone Encounter (Signed)
Patient is calling asking for a refill on pain medicationsoxyCODONE-acetaminophen (ROXICET) 5-325 MG per tablet and Morphine, Please advise?

## 2014-02-18 ENCOUNTER — Telehealth: Payer: Self-pay | Admitting: Radiology

## 2014-02-18 NOTE — Telephone Encounter (Signed)
Approved.  

## 2014-02-18 NOTE — Telephone Encounter (Signed)
Patient needs her Morphine and Oxycontin refilled.

## 2014-02-23 ENCOUNTER — Other Ambulatory Visit: Payer: Self-pay | Admitting: *Deleted

## 2014-02-23 DIAGNOSIS — M25562 Pain in left knee: Secondary | ICD-10-CM

## 2014-02-23 MED ORDER — OXYCODONE-ACETAMINOPHEN 5-325 MG PO TABS: 1.0000 | ORAL_TABLET | ORAL | Status: DC | PRN

## 2014-02-23 MED ORDER — MORPHINE SULFATE ER 15 MG PO TBCR: 15.0000 mg | EXTENDED_RELEASE_TABLET | Freq: Two times a day (BID) | ORAL | Status: DC

## 2014-02-23 NOTE — Telephone Encounter (Signed)
Prescription available for pick up, called patient, no answer, left vm 

## 2014-02-23 NOTE — Telephone Encounter (Signed)
PATIENT PICKED UP RX 

## 2014-03-16 ENCOUNTER — Other Ambulatory Visit: Payer: Self-pay | Admitting: *Deleted

## 2014-03-16 ENCOUNTER — Telehealth: Payer: Self-pay | Admitting: Orthopedic Surgery

## 2014-03-16 DIAGNOSIS — M25562 Pain in left knee: Secondary | ICD-10-CM

## 2014-03-16 MED ORDER — OXYCODONE-ACETAMINOPHEN 5-325 MG PO TABS: 1.0000 | ORAL_TABLET | ORAL | Status: DC | PRN

## 2014-03-16 MED ORDER — MORPHINE SULFATE ER 15 MG PO TBCR: 15.0000 mg | EXTENDED_RELEASE_TABLET | Freq: Two times a day (BID) | ORAL | Status: AC

## 2014-03-16 NOTE — Telephone Encounter (Signed)
Prescription available, patient aware  

## 2014-03-16 NOTE — Telephone Encounter (Signed)
Patient called to relay she is running out of pain medications: (1) Morphine 15 mg,12-hour tablet; (2) Oxycodone 5-325; her phone # is [phone].

## 2014-03-30 ENCOUNTER — Telehealth: Payer: Self-pay | Admitting: Orthopedic Surgery

## 2014-04-01 ENCOUNTER — Other Ambulatory Visit: Payer: Self-pay | Admitting: *Deleted

## 2014-04-01 DIAGNOSIS — M62838 Other muscle spasm: Secondary | ICD-10-CM

## 2014-04-01 MED ORDER — CYCLOBENZAPRINE HCL 10 MG PO TABS: 10.0000 mg | ORAL_TABLET | Freq: Three times a day (TID) | ORAL | Status: DC | PRN

## 2014-04-15 ENCOUNTER — Other Ambulatory Visit: Payer: Self-pay | Admitting: *Deleted

## 2014-04-15 MED ORDER — OXYCODONE-ACETAMINOPHEN 5-325 MG PO TABS: 1.0000 | ORAL_TABLET | ORAL | Status: DC | PRN

## 2014-04-15 NOTE — Telephone Encounter (Signed)
Patient is calling asking for a refill on pain medication   oxyCODONE-acetaminophen (ROXICET) 5-325 MG per tablet please advise?

## 2014-04-16 NOTE — Telephone Encounter (Signed)
PRESCRIPTION AVAILABLE, CALLED PATIENT, NO ANSWER

## 2014-04-21 NOTE — Telephone Encounter (Signed)
PATIENT PICKED UP RX 

## 2014-04-30 ENCOUNTER — Other Ambulatory Visit: Payer: Self-pay | Admitting: *Deleted

## 2014-04-30 ENCOUNTER — Telehealth: Payer: Self-pay | Admitting: Orthopedic Surgery

## 2014-04-30 DIAGNOSIS — M25562 Pain in left knee: Secondary | ICD-10-CM

## 2014-04-30 MED ORDER — OXYCODONE-ACETAMINOPHEN 5-325 MG PO TABS: 1.0000 | ORAL_TABLET | ORAL | Status: DC | PRN

## 2014-04-30 MED ORDER — MORPHINE SULFATE ER 15 MG PO TBCR: 15.0000 mg | EXTENDED_RELEASE_TABLET | Freq: Two times a day (BID) | ORAL | Status: DC

## 2014-04-30 NOTE — Telephone Encounter (Signed)
Patient called to request pain medication refill - Morphine.  Aware it will be reviewed on our next clinic day, Monday, 05/04/14.  Patient's ph#s: [phone] [facility]) [phone] [facility])

## 2014-05-04 ENCOUNTER — Other Ambulatory Visit: Payer: Self-pay | Admitting: Orthopedic Surgery

## 2014-05-04 NOTE — Telephone Encounter (Signed)
Prescription available, called patient, no answer 

## 2014-05-07 ENCOUNTER — Other Ambulatory Visit: Payer: Self-pay | Admitting: Orthopedic Surgery

## 2014-05-07 NOTE — Telephone Encounter (Signed)
Patient picked up Rx

## 2014-05-27 ENCOUNTER — Telehealth: Payer: Self-pay | Admitting: Orthopedic Surgery

## 2014-05-27 ENCOUNTER — Other Ambulatory Visit: Payer: Self-pay | Admitting: *Deleted

## 2014-05-27 DIAGNOSIS — M25562 Pain in left knee: Secondary | ICD-10-CM

## 2014-05-27 DIAGNOSIS — M62838 Other muscle spasm: Secondary | ICD-10-CM

## 2014-05-27 MED ORDER — CYCLOBENZAPRINE HCL 10 MG PO TABS: 10.0000 mg | ORAL_TABLET | Freq: Three times a day (TID) | ORAL | Status: DC | PRN

## 2014-05-27 MED ORDER — MORPHINE SULFATE ER 15 MG PO TBCR: 15.0000 mg | EXTENDED_RELEASE_TABLET | Freq: Two times a day (BID) | ORAL | Status: DC

## 2014-05-27 MED ORDER — OXYCODONE-ACETAMINOPHEN 5-325 MG PO TABS: 1.0000 | ORAL_TABLET | ORAL | Status: DC | PRN

## 2014-05-27 NOTE — Telephone Encounter (Signed)
Prescription available, called patient, no answer 

## 2014-05-27 NOTE — Telephone Encounter (Signed)
Patient called to request refills on 3 medications:  States running out of her 2 pain medications: Oxycodone-acetaminophen and soon to be out of Morphine(mS-contin)15mg .  She also states that her pharmacy has faxed 2 or 3 request for refill on her "muscle relaxer": cyclobenzaprine (FLEXERIL) 10 MG tablet [[id number]] - please advise.  Patient's ph# is [phone]

## 2014-05-29 NOTE — Telephone Encounter (Signed)
Patient picked up Rx's.

## 2014-06-16 ENCOUNTER — Telehealth: Payer: Self-pay | Admitting: Orthopedic Surgery

## 2014-06-16 ENCOUNTER — Other Ambulatory Visit: Payer: Self-pay | Admitting: *Deleted

## 2014-06-16 DIAGNOSIS — M25562 Pain in left knee: Secondary | ICD-10-CM

## 2014-06-16 MED ORDER — MORPHINE SULFATE ER 15 MG PO TBCR: 15.0000 mg | EXTENDED_RELEASE_TABLET | Freq: Two times a day (BID) | ORAL | Status: DC

## 2014-06-16 MED ORDER — OXYCODONE-ACETAMINOPHEN 5-325 MG PO TABS: 1.0000 | ORAL_TABLET | ORAL | Status: DC | PRN

## 2014-06-16 NOTE — Telephone Encounter (Addendum)
Patient is requesting a refill on both pain medications oxyCODONE-acetaminophen (ROXICET) 5-325 MG per tablet [[id number]] and  morphine (MS CONTIN) 15 MG 12 hr tablet please advise?

## 2014-06-18 NOTE — Telephone Encounter (Signed)
Patient picked up Rx

## 2014-06-18 NOTE — Telephone Encounter (Signed)
Prescription available, patient aware  

## 2014-06-29 ENCOUNTER — Other Ambulatory Visit: Payer: Self-pay | Admitting: Orthopedic Surgery

## 2014-07-07 ENCOUNTER — Ambulatory Visit: Payer: Medicare Other | Admitting: Orthopedic Surgery

## 2014-07-16 ENCOUNTER — Ambulatory Visit (INDEPENDENT_AMBULATORY_CARE_PROVIDER_SITE_OTHER): Payer: Medicare Other | Admitting: Orthopedic Surgery

## 2014-07-16 ENCOUNTER — Ambulatory Visit (INDEPENDENT_AMBULATORY_CARE_PROVIDER_SITE_OTHER): Payer: Medicare Other

## 2014-07-16 VITALS — BP 155/95 | Ht 64.0 in | Wt 210.0 lb

## 2014-07-16 DIAGNOSIS — M1710 Unilateral primary osteoarthritis, unspecified knee: Secondary | ICD-10-CM

## 2014-07-16 DIAGNOSIS — M129 Arthropathy, unspecified: Secondary | ICD-10-CM | POA: Diagnosis not present

## 2014-07-16 DIAGNOSIS — Z96651 Presence of right artificial knee joint: Secondary | ICD-10-CM

## 2014-07-16 MED ORDER — OXYCODONE-ACETAMINOPHEN 5-325 MG PO TABS: 1.0000 | ORAL_TABLET | ORAL | Status: DC | PRN

## 2014-07-16 MED ORDER — TIZANIDINE HCL 4 MG PO TABS: 4.0000 mg | ORAL_TABLET | Freq: Four times a day (QID) | ORAL | Status: DC | PRN

## 2014-07-16 NOTE — Progress Notes (Signed)
Patient ID: Katelyn Thompson, female   DOB: 04-Dec-1965, 49 y.o.   MRN: [id number]  Post op annual TKA   Chief Complaint  Patient presents with  . Follow-up    yearly follow up + xray right TKA, DOS 07/01/12    HPI Katelyn Thompson is a 49 y.o. female.  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 She also has chronic pain which is followed for here.   Past Medical History  Diagnosis Date  . GERD (gastroesophageal reflux disease)   . COPD (chronic obstructive pulmonary disease)   . HTN (hypertension)   . Nerve damage     Severe to left foot  . Bipolar 1 disorder   . Anxiety and depression     Does not like being around lots of people  . Hypercholesteremia   . MS (multiple sclerosis)     but not offcially disgnosed  . Barrett's esophagus without dysplasia   . Arthritis   . PONV (postoperative nausea and vomiting)   . Depression   . Anxiety   . OA (osteoarthritis) of knee 01/31/2012    Past Surgical History  Procedure Laterality Date  . S/p hysterectomy    . Tubal ligation    . Sinus surgery with instatrak    . Foot surgery      x 2  . Esophagogastroduodenoscopy  06/2010    diagnosed with Barrett's, small hh, esophagus dilated. Next EGD 06/2011  . Colonoscopy  11/20/2011    RMR: Friable anorectum; single anal canal hemorrhoidal tag. Otherwise normal rectum and colon  . Surgery of lip      biopsy  . Ankle arthroscopy with drilling/microfracture  02/26/2012    Procedure: ANKLE ARTHROSCOPY WITH DRILLING/MICROFRACTURE;  Surgeon: Vickki Hearing, MD;  Location: [facility];  Service: Orthopedics;  Laterality: Right;  . Knee arthroscopy  02/26/2012    Procedure: ARTHROSCOPY KNEE;  Surgeon: Vickki Hearing, MD;  Location: [facility];  Service: Orthopedics;  Laterality: Right;  with three chondroplasties  . Esophagogastroduodenoscopy  Aug 2013    RMR: abnormal distal esophagus  consistent with prior dx of SS Barrett;s. s/p dilation. small hiatal hernia. Biopsies: no metaplasia, dysplasia, malignancy. SURVEILLANCE AUG 2016  . Abdominal hysterectomy    . Total knee arthroplasty Right 07/01/2012    Procedure: TOTAL KNEE ARTHROPLASTY;  Surgeon: Vickki Hearing, MD;  Location: [facility];  Service: Orthopedics;  Laterality: Right;  . Exam under anesthesia with manipulation of knee Right 09/16/2012    Procedure: EXAM UNDER ANESTHESIA WITH MANIPULATION OF RIGHT KNEE;  Surgeon: Vickki Hearing, MD;  Location: [facility];  Service: Orthopedics;  Laterality: Right;  . Knee arthrotomy Left 11/21/2013    Procedure: KNEE ARTHROSCOPY  WITH MEDIAL, LATERAL, AND PATELLA  FEMORAL CHONDROPLASTY;  Surgeon: Vickki Hearing, MD;  Location: [facility];  Service: Orthopedics;  Laterality: Left;     Allergies  Allergen Reactions  . Aspirin Other (See Comments)    esophagus disease  . Nitrofurantoin Nausea And Vomiting  . Relafen [Nabumetone] Nausea And Vomiting    Current Outpatient Prescriptions  Medication Sig Dispense Refill  . albuterol (PROVENTIL) (2.5 MG/3ML) 0.083% nebulizer solution Take 2.5 mg by nebulization every 6 (six) hours as needed for shortness of breath.     . ALPRAZolam (XANAX) 1 MG tablet Take 1 mg by mouth 3 (three) times daily as needed for anxiety.     Marland Kitchen  amitriptyline (ELAVIL) 25 MG tablet Take 25 mg by mouth at bedtime.      Marland Kitchen amLODipine (NORVASC) 5 MG tablet Take 5 mg by mouth every morning.     . cyclobenzaprine (FLEXERIL) 10 MG tablet Take 1 tablet (10 mg total) by mouth 3 (three) times daily as needed for muscle spasms. 60 tablet 0  . dexlansoprazole (DEXILANT) 60 MG capsule Take 1 capsule (60 mg total) by mouth daily. 30 capsule 3  . dextromethorphan-guaiFENesin (MUCINEX DM) 30-600 MG per 12 hr tablet Take 1 tablet by mouth every 12 (twelve) hours.      . ergocalciferol (VITAMIN D2) 50000 UNITS capsule Take 50,000 Units by mouth once a week. On fridays    .  gabapentin (NEURONTIN) 800 MG tablet Take 800 mg by mouth 3 (three) times daily.      Marland Kitchen lamoTRIgine (LAMICTAL) 100 MG tablet Take 100 mg by mouth every morning.     . Linaclotide 290 MCG CAPS Take 1 capsule by mouth daily. 30 minutes before a meal 30 capsule 3  . lisinopril (PRINIVIL,ZESTRIL) 10 MG tablet Take 20 mg by mouth every morning.     . Mometasone Furo-Formoterol Fum (DULERA) 200-5 MCG/ACT AERO Inhale 2 puffs into the lungs 2 (two) times daily.     Marland Kitchen oxyCODONE-acetaminophen (ROXICET) 5-325 MG per tablet Take 1 tablet by mouth every 4 (four) hours as needed for severe pain. 180 tablet 0  . pantoprazole (PROTONIX) 40 MG tablet Take 1 tablet (40 mg total) by mouth 2 (two) times daily. 60 tablet 3  . QUEtiapine (SEROQUEL) 25 MG tablet Take 50 mg by mouth at bedtime.     . senna-docusate (SENOKOT-S) 8.6-50 MG per tablet Take 1 tablet by mouth at bedtime as needed. 30 tablet 0  . SEROQUEL XR 50 MG TB24 Take 50 mg by mouth at bedtime.     . traZODone (DESYREL) 150 MG tablet Take 300 mg by mouth at bedtime.     Marland Kitchen tiZANidine (ZANAFLEX) 4 MG tablet Take 1 tablet (4 mg total) by mouth every 6 (six) hours as needed for muscle spasms. 60 tablet 0   No current facility-administered medications for this visit.    Review of Systems Review of Systems   Physical Exam Blood pressure 155/95, height  (1.626 m), weight 210 lb (95.255 kg).  The patient is awake alert and oriented 3 mood and affect normal. General appearance well-groomed. The patient is ambulatory with cane for assistive device The knee remains stable and the anterior posterior and medial lateral plane. Quadriceps strength is normal. The incision healed well there is no swelling.  Knee flexion 90 degrees Data Reviewed KNEE XRAYS : Her knee x-rays always look great and stay due today  Assessment S/P TKA   Plan    Continue chronic pain management       Fuller Canada 07/16/2014, 11:41 AM

## 2014-08-03 ENCOUNTER — Other Ambulatory Visit: Payer: Self-pay | Admitting: Orthopedic Surgery

## 2014-08-20 ENCOUNTER — Telehealth: Payer: Self-pay | Admitting: Orthopedic Surgery

## 2014-08-20 ENCOUNTER — Ambulatory Visit (INDEPENDENT_AMBULATORY_CARE_PROVIDER_SITE_OTHER): Payer: Medicare Other | Admitting: Orthopedic Surgery

## 2014-08-20 VITALS — BP 140/95 | Ht 64.0 in | Wt 211.0 lb

## 2014-08-20 DIAGNOSIS — M129 Arthropathy, unspecified: Secondary | ICD-10-CM

## 2014-08-20 DIAGNOSIS — M1710 Unilateral primary osteoarthritis, unspecified knee: Secondary | ICD-10-CM

## 2014-08-20 NOTE — Telephone Encounter (Signed)
Patient stated before she left the office that she will need a refill on her morphine (MS CONTIN) 15 MG 12 hr tablet and she will come back in a few days to pick up, please advise?

## 2014-08-20 NOTE — Progress Notes (Signed)
Patient ID: Katelyn Thompson, female   DOB: 22-Mar-1966, 49 y.o.   MRN: [id number] Chief Complaint  Patient presents with  . Joint Swelling    left knee swelling    Recurrent effusion left knee  Patient would like aspiration  Left knee looks swollen tender no red  I aspirated 55 mL of fluid  Patient will continue her current medications follow-up as needed  Procedure note injection and aspiration left knee joint  Verbal consent was obtained to aspirate and inject the left knee joint   Timeout was completed to confirm the site of aspiration and injection  An 18-gauge needle was used to aspirate the left knee joint from a suprapatellar lateral approach.  The medications used were 40 mg of Depo-Medrol and 1% lidocaine 3 cc  Anesthesia was provided by ethyl chloride and the skin was prepped with alcohol.  After cleaning the skin with alcohol an 18-gauge needle was used to aspirate the right knee joint.  We obtained 55 cc of fluid  We follow this by injection of 40 mg of Depo-Medrol and 3 cc 1% lidocaine.  There were no complications. A sterile bandage was applied.

## 2014-08-24 ENCOUNTER — Other Ambulatory Visit: Payer: Self-pay | Admitting: *Deleted

## 2014-08-24 DIAGNOSIS — M25562 Pain in left knee: Secondary | ICD-10-CM

## 2014-08-24 MED ORDER — OXYCODONE-ACETAMINOPHEN 5-325 MG PO TABS: 1.0000 | ORAL_TABLET | ORAL | Status: DC | PRN

## 2014-08-24 MED ORDER — MORPHINE SULFATE ER 15 MG PO TBCR: 15.0000 mg | EXTENDED_RELEASE_TABLET | Freq: Two times a day (BID) | ORAL | Status: AC

## 2014-08-24 NOTE — Telephone Encounter (Signed)
Prescription available, called patient, no answer 

## 2014-09-03 NOTE — Telephone Encounter (Signed)
Patient picked up Rx

## 2014-09-04 ENCOUNTER — Emergency Department (HOSPITAL_COMMUNITY): Payer: Medicare Other

## 2014-09-04 ENCOUNTER — Emergency Department (HOSPITAL_COMMUNITY)
Admission: EM | Admit: 2014-09-04 | Discharge: 2014-09-04 | Disposition: A | Discharge status: Home / Self Care | Payer: Medicare Other | Attending: Emergency Medicine | Admitting: Emergency Medicine

## 2014-09-04 ENCOUNTER — Encounter (HOSPITAL_COMMUNITY): Payer: Self-pay

## 2014-09-04 DIAGNOSIS — Z8669 Personal history of other diseases of the nervous system and sense organs: Secondary | ICD-10-CM | POA: Diagnosis not present

## 2014-09-04 DIAGNOSIS — Z7951 Long term (current) use of inhaled steroids: Secondary | ICD-10-CM | POA: Diagnosis not present

## 2014-09-04 DIAGNOSIS — M1710 Unilateral primary osteoarthritis, unspecified knee: Secondary | ICD-10-CM | POA: Insufficient documentation

## 2014-09-04 DIAGNOSIS — E780 Pure hypercholesterolemia: Secondary | ICD-10-CM | POA: Diagnosis not present

## 2014-09-04 DIAGNOSIS — I10 Essential (primary) hypertension: Secondary | ICD-10-CM | POA: Insufficient documentation

## 2014-09-04 DIAGNOSIS — R0602 Shortness of breath: Secondary | ICD-10-CM | POA: Diagnosis present

## 2014-09-04 DIAGNOSIS — Z79899 Other long term (current) drug therapy: Secondary | ICD-10-CM | POA: Diagnosis not present

## 2014-09-04 DIAGNOSIS — J441 Chronic obstructive pulmonary disease with (acute) exacerbation: Secondary | ICD-10-CM | POA: Diagnosis not present

## 2014-09-04 DIAGNOSIS — F319 Bipolar disorder, unspecified: Secondary | ICD-10-CM | POA: Insufficient documentation

## 2014-09-04 DIAGNOSIS — F419 Anxiety disorder, unspecified: Secondary | ICD-10-CM | POA: Diagnosis not present

## 2014-09-04 DIAGNOSIS — K219 Gastro-esophageal reflux disease without esophagitis: Secondary | ICD-10-CM | POA: Insufficient documentation

## 2014-09-04 DIAGNOSIS — Z720 Tobacco use: Secondary | ICD-10-CM | POA: Insufficient documentation

## 2014-09-04 LAB — CBC WITH DIFFERENTIAL/PLATELET
BASOS PCT: 0 % (ref 0–1)
Basophils Absolute: 0 10*3/uL (ref 0.0–0.1)
Eosinophils Absolute: 0.1 10*3/uL (ref 0.0–0.7)
Eosinophils Relative: 1 % (ref 0–5)
HCT: 44 % (ref 36.0–46.0)
HEMOGLOBIN: 15.2 g/dL — AB (ref 12.0–15.0)
Lymphocytes Relative: 24 % (ref 12–46)
Lymphs Abs: 2.1 10*3/uL (ref 0.7–4.0)
MCH: 31.3 pg (ref 26.0–34.0)
MCHC: 34.5 g/dL (ref 30.0–36.0)
MCV: 90.5 fL (ref 78.0–100.0)
MONO ABS: 0.9 10*3/uL (ref 0.1–1.0)
Monocytes Relative: 11 % (ref 3–12)
NEUTROS PCT: 64 % (ref 43–77)
Neutro Abs: 5.7 10*3/uL (ref 1.7–7.7)
PLATELETS: 205 10*3/uL (ref 150–400)
RBC: 4.86 MIL/uL (ref 3.87–5.11)
RDW: 12.2 % (ref 11.5–15.5)
WBC: 8.8 10*3/uL (ref 4.0–10.5)

## 2014-09-04 LAB — BASIC METABOLIC PANEL
Anion gap: 13 (ref 5–15)
BUN: 7 mg/dL (ref 6–20)
CALCIUM: 8.9 mg/dL (ref 8.9–10.3)
CO2: 24 mmol/L (ref 22–32)
Chloride: 92 mmol/L — ABNORMAL LOW (ref 101–111)
Creatinine, Ser: 0.51 mg/dL (ref 0.44–1.00)
GFR calc non Af Amer: >60 mL/min (ref >60)
GLUCOSE: 90 mg/dL (ref 65–99)
Potassium: 3.5 mmol/L (ref 3.5–5.1)
SODIUM: 129 mmol/L — AB (ref 135–145)

## 2014-09-04 LAB — TROPONIN I: Troponin I: <0.03 ng/mL (ref <0.031)

## 2014-09-04 MED ORDER — SODIUM CHLORIDE 0.9 % IV BOLUS (SEPSIS)
1000.0000 mL | Freq: Once | INTRAVENOUS | Status: AC
  Administered 2014-09-04: 1000 mL via INTRAVENOUS

## 2014-09-04 MED ORDER — METHYLPREDNISOLONE SODIUM SUCC 125 MG IJ SOLR
125.0000 mg | Freq: Once | INTRAMUSCULAR | Status: AC
  Administered 2014-09-04: 125 mg via INTRAVENOUS
  Filled 2014-09-04: qty 2

## 2014-09-04 MED ORDER — IPRATROPIUM-ALBUTEROL 0.5-2.5 (3) MG/3ML IN SOLN
3.0000 mL | Freq: Once | RESPIRATORY_TRACT | Status: AC
  Administered 2014-09-04: 3 mL via RESPIRATORY_TRACT
  Filled 2014-09-04: qty 3

## 2014-09-04 MED ORDER — PREDNISONE 20 MG PO TABS: 60.0000 mg | ORAL_TABLET | Freq: Every day | ORAL | Status: DC

## 2014-09-04 MED ORDER — IPRATROPIUM-ALBUTEROL 0.5-2.5 (3) MG/3ML IN SOLN: 3.0000 mL | Freq: Once | RESPIRATORY_TRACT | Status: DC

## 2014-09-04 MED ORDER — ALBUTEROL (5 MG/ML) CONTINUOUS INHALATION SOLN
10.0000 mg/h | INHALATION_SOLUTION | RESPIRATORY_TRACT | Status: DC
  Administered 2014-09-04: 10 mg/h via RESPIRATORY_TRACT
  Filled 2014-09-04: qty 20

## 2014-09-04 MED ORDER — ONDANSETRON HCL 4 MG/2ML IJ SOLN
4.0000 mg | Freq: Once | INTRAMUSCULAR | Status: AC
  Administered 2014-09-04: 4 mg via INTRAVENOUS
  Filled 2014-09-04: qty 2

## 2014-09-04 NOTE — ED Notes (Signed)
I started wheezing last night and got worse today. Shortness of breath started today. I don't know if it is my COPD or what per pt. Patient having shortness of breath at triage, hard to make complete sentences. Audible wheezing noted.

## 2014-09-04 NOTE — Discharge Instructions (Signed)
Chronic Obstructive Pulmonary Disease Exacerbation Chronic obstructive pulmonary disease (COPD) is a common lung condition in which airflow from the lungs is limited. COPD is a general term that can be used to describe many different lung problems that limit airflow, including chronic bronchitis and emphysema. COPD exacerbations are episodes when breathing symptoms become much worse and require extra treatment. Without treatment, COPD exacerbations can be life threatening, and frequent COPD exacerbations can cause further damage to your lungs. CAUSES   Respiratory infections.   Exposure to smoke.   Exposure to air pollution, chemical fumes, or dust. Sometimes there is no apparent cause or trigger. RISK FACTORS  Smoking cigarettes.  Older age.  Frequent prior COPD exacerbations. SIGNS AND SYMPTOMS   Increased coughing.   Increased thick spit (sputum) production.   Increased wheezing.   Increased shortness of breath.   Rapid breathing.   Chest tightness. DIAGNOSIS  Your medical history, a physical exam, and tests will help your health care provider make a diagnosis. Tests may include:  A chest X-ray.  Basic lab tests.  Sputum testing.  An arterial blood gas test. TREATMENT  Depending on the severity of your COPD exacerbation, you may need to be admitted to a hospital for treatment. Some of the treatments commonly used to treat COPD exacerbations are:   Antibiotic medicines.   Bronchodilators. These are drugs that expand the air passages. They may be given with an inhaler or nebulizer. Spacer devices may be needed to help improve drug delivery.  Corticosteroid medicines.  Supplemental oxygen therapy.  HOME CARE INSTRUCTIONS   Do not smoke. Quitting smoking is very important to prevent COPD from getting worse and exacerbations from happening as often.  Avoid exposure to all substances that irritate the airway, especially to tobacco smoke.   If you were  prescribed an antibiotic medicine, finish it all even if you start to feel better.  Take all medicines as directed by your health care provider.It is important to use correct technique with inhaled medicines.  Drink enough fluids to keep your urine clear or pale yellow (unless you have a medical condition that requires fluid restriction).  Use a cool mist vaporizer. This makes it easier to clear your chest when you cough.   If you have a home nebulizer and oxygen, continue to use them as directed.   Maintain all necessary vaccinations to prevent infections.   Exercise regularly.   Eat a healthy diet.   Keep all follow-up appointments as directed by your health care provider. SEEK IMMEDIATE MEDICAL CARE IF:  You have worsening shortness of breath.   You have trouble talking.   You have severe chest pain.  You have blood in your sputum.  You have a fever.  You have weakness, vomit repeatedly, or faint.   You feel confused.   You continue to get worse. MAKE SURE YOU:   Understand these instructions.  Will watch your condition.  Will get help right away if you are not doing well or get worse. Document Released: 01/22/2007 Document Revised: 08/11/2013 Document Reviewed: 11/29/2012 ExitCare Patient Information 2015 [employer]. This information is not intended to replace advice given to you by your health care provider. Make sure you discuss any questions you have with your health care provider.  Smoking Cessation Quitting smoking is important to your health and has many advantages. However, it is not always easy to quit since nicotine is a very addictive drug. Oftentimes, people try 3 times or more before being able to   quit. This document explains the best ways for you to prepare to quit smoking. Quitting takes hard work and a lot of effort, but you can do it. ADVANTAGES OF QUITTING SMOKING  You will live longer, feel better, and live better.  Your body  will feel the impact of quitting smoking almost immediately.  Within 20 minutes, blood pressure decreases. Your pulse returns to its normal level.  After 8 hours, carbon monoxide levels in the blood return to normal. Your oxygen level increases.  After 24 hours, the chance of having a heart attack starts to decrease. Your breath, hair, and body stop smelling like smoke.  After 48 hours, damaged nerve endings begin to recover. Your sense of taste and smell improve.  After 72 hours, the body is virtually free of nicotine. Your bronchial tubes relax and breathing becomes easier.  After 2 to 12 weeks, lungs can hold more air. Exercise becomes easier and circulation improves.  The risk of having a heart attack, stroke, cancer, or lung disease is greatly reduced.  After 1 year, the risk of coronary heart disease is cut in half.  After 5 years, the risk of stroke falls to the same as a nonsmoker.  After 10 years, the risk of lung cancer is cut in half and the risk of other cancers decreases significantly.  After 15 years, the risk of coronary heart disease drops, usually to the level of a nonsmoker.  If you are pregnant, quitting smoking will improve your chances of having a healthy baby.  The people you live with, especially any children, will be healthier.  You will have extra money to spend on things other than cigarettes. QUESTIONS TO THINK ABOUT BEFORE ATTEMPTING TO QUIT You may want to talk about your answers with your health care provider.  Why do you want to quit?  If you tried to quit in the past, what helped and what did not?  What will be the most difficult situations for you after you quit? How will you plan to handle them?  Who can help you through the tough times? Your family? Friends? A health care provider?  What pleasures do you get from smoking? What ways can you still get pleasure if you quit? Here are some questions to ask your health care provider:  How can  you help me to be successful at quitting?  What medicine do you think would be best for me and how should I take it?  What should I do if I need more help?  What is smoking withdrawal like? How can I get information on withdrawal? GET READY  Set a quit date.  Change your environment by getting rid of all cigarettes, ashtrays, matches, and lighters in your home, car, or work. Do not let people smoke in your home.  Review your past attempts to quit. Think about what worked and what did not. GET SUPPORT AND ENCOURAGEMENT You have a better chance of being successful if you have help. You can get support in many ways.  Tell your family, friends, and coworkers that you are going to quit and need their support. Ask them not to smoke around you.  Get individual, group, or telephone counseling and support. Programs are available at local hospitals and health centers. Call your local health department for information about programs in your area.  Spiritual beliefs and practices may help some smokers quit.  Download a "quit meter" on your computer to keep track of quit statistics, such as how   long you have gone without smoking, cigarettes not smoked, and money saved.  Get a self-help book about quitting smoking and staying off tobacco. LEARN NEW SKILLS AND BEHAVIORS  Distract yourself from urges to smoke. Talk to someone, go for a walk, or occupy your time with a task.  Change your normal routine. Take a different route to work. Drink tea instead of coffee. Eat breakfast in a different place.  Reduce your stress. Take a hot bath, exercise, or read a book.  Plan something enjoyable to do every day. Reward yourself for not smoking.  Explore interactive web-based programs that specialize in helping you quit. GET MEDICINE AND USE IT CORRECTLY Medicines can help you stop smoking and decrease the urge to smoke. Combining medicine with the above behavioral methods and support can greatly increase  your chances of successfully quitting smoking.  Nicotine replacement therapy helps deliver nicotine to your body without the negative effects and risks of smoking. Nicotine replacement therapy includes nicotine gum, lozenges, inhalers, nasal sprays, and skin patches. Some may be available over-the-counter and others require a prescription.  Antidepressant medicine helps people abstain from smoking, but how this works is unknown. This medicine is available by prescription.  Nicotinic receptor partial agonist medicine simulates the effect of nicotine in your brain. This medicine is available by prescription. Ask your health care provider for advice about which medicines to use and how to use them based on your health history. Your health care provider will tell you what side effects to look out for if you choose to be on a medicine or therapy. Carefully read the information on the package. Do not use any other product containing nicotine while using a nicotine replacement product.  RELAPSE OR DIFFICULT SITUATIONS Most relapses occur within the first 3 months after quitting. Do not be discouraged if you start smoking again. Remember, most people try several times before finally quitting. You may have symptoms of withdrawal because your body is used to nicotine. You may crave cigarettes, be irritable, feel very hungry, cough often, get headaches, or have difficulty concentrating. The withdrawal symptoms are only temporary. They are strongest when you first quit, but they will go away within 10-14 days. To reduce the chances of relapse, try to:  Avoid drinking alcohol. Drinking lowers your chances of successfully quitting.  Reduce the amount of caffeine you consume. Once you quit smoking, the amount of caffeine in your body increases and can give you symptoms, such as a rapid heartbeat, sweating, and anxiety.  Avoid smokers because they can make you want to smoke.  Do not let weight gain distract you.  Many smokers will gain weight when they quit, usually less than 10 pounds. Eat a healthy diet and stay active. You can always lose the weight gained after you quit.  Find ways to improve your mood other than smoking. FOR MORE INFORMATION  www.smokefree.gov  Document Released: 03/21/2001 Document Revised: 08/11/2013 Document Reviewed: 07/06/2011 [employer] Patient Information 2015 [employer]. This information is not intended to replace advice given to you by your health care provider. Make sure you discuss any questions you have with your health care provider.  

## 2014-09-04 NOTE — ED Notes (Signed)
Pt. C/o nausea. Zofran to be given.

## 2014-09-04 NOTE — ED Notes (Signed)
Attempted IV access x2 without success. Zella Ball, RN to attempt access.

## 2014-09-04 NOTE — ED Provider Notes (Signed)
TIME SEEN: 8:10 PM  CHIEF COMPLAINT: Coughing, wheezing, shortness of breath, left-sided chest aching  HPI: Pt is a 49 y.o. female with history of hypertension, hyperlipidemia, COPD who still smokes is not on oxygen who presents to the emergency department with several days of shortness of breath, wheezing, cough with yellow sputum production. Reports no fever but has been exposed to someone with pneumonia. No history of PE or DVT. No lower extremity swelling or pain. No recent prolonged immobilization such as long flight, hospitalization, fracture, surgery, trauma. Not on exogenous estrogen. Is having left chest pain that is a "aching" pain.  ROS: See HPI Constitutional: no fever  Eyes: no drainage  ENT: no runny nose   Cardiovascular:   chest pain  Resp: SOB  GI: no vomiting GU: no dysuria Integumentary: no rash  Allergy: no hives  Musculoskeletal: no leg swelling  Neurological: no slurred speech ROS otherwise negative  PAST MEDICAL HISTORY/PAST SURGICAL HISTORY:  Past Medical History  Diagnosis Date  . GERD (gastroesophageal reflux disease)   . COPD (chronic obstructive pulmonary disease)   . HTN (hypertension)   . Nerve damage     Severe to left foot  . Bipolar 1 disorder   . Anxiety and depression     Does not like being around lots of people  . Hypercholesteremia   . MS (multiple sclerosis)     but not offcially disgnosed  . Barrett's esophagus without dysplasia   . Arthritis   . PONV (postoperative nausea and vomiting)   . Depression   . Anxiety   . OA (osteoarthritis) of knee 01/31/2012    MEDICATIONS:  Prior to Admission medications   Medication Sig Start Date End Date Taking? Authorizing Provider  albuterol (PROVENTIL) (2.5 MG/3ML) 0.083% nebulizer solution Take 2.5 mg by nebulization every 6 (six) hours as needed for shortness of breath.     Historical Provider, MD  ALPRAZolam Prudy Feeler) 1 MG tablet Take 1 mg by mouth 3 (three) times daily as needed for anxiety.      Historical Provider, MD  amitriptyline (ELAVIL) 25 MG tablet Take 25 mg by mouth at bedtime.      Historical Provider, MD  amLODipine (NORVASC) 5 MG tablet Take 5 mg by mouth every morning.     Historical Provider, MD  cyclobenzaprine (FLEXERIL) 10 MG tablet Take 1 tablet (10 mg total) by mouth 3 (three) times daily as needed for muscle spasms. 05/27/14   Vickki Hearing, MD  dexlansoprazole (DEXILANT) 60 MG capsule Take 1 capsule (60 mg total) by mouth daily. 03/21/12   Nira Retort, NP  dextromethorphan-guaiFENesin [facility] DM) 30-600 MG per 12 hr tablet Take 1 tablet by mouth every 12 (twelve) hours.      Historical Provider, MD  ergocalciferol (VITAMIN D2) 50000 UNITS capsule Take 50,000 Units by mouth once a week. On fridays    Historical Provider, MD  gabapentin (NEURONTIN) 800 MG tablet Take 800 mg by mouth 3 (three) times daily.      Historical Provider, MD  lamoTRIgine (LAMICTAL) 100 MG tablet Take 100 mg by mouth every morning.     Historical Provider, MD  Linaclotide 290 MCG CAPS Take 1 capsule by mouth daily. 30 minutes before a meal 03/21/12   Nira Retort, NP  lisinopril (PRINIVIL,ZESTRIL) 10 MG tablet Take 20 mg by mouth every morning.  08/31/10   Historical Provider, MD  Mometasone Furo-Formoterol Fum (DULERA) 200-5 MCG/ACT AERO Inhale 2 puffs into the lungs 2 (two) times daily.  Historical Provider, MD  morphine (MS CONTIN) 15 MG 12 hr tablet Take 1 tablet (15 mg total) by mouth every 12 (twelve) hours. 08/24/14 09/21/14  Vickki Hearing, MD  oxyCODONE-acetaminophen (ROXICET) 5-325 MG per tablet Take 1 tablet by mouth every 4 (four) hours as needed for severe pain. 08/24/14   Vickki Hearing, MD  pantoprazole (PROTONIX) 40 MG tablet Take 1 tablet (40 mg total) by mouth 2 (two) times daily. 05/29/13   Nira Retort, NP  QUEtiapine (SEROQUEL) 25 MG tablet Take 50 mg by mouth at bedtime.  11/04/13   Historical Provider, MD  senna-docusate (SENOKOT-S) 8.6-50 MG per tablet Take 1  tablet by mouth at bedtime as needed. 07/04/12   Vickki Hearing, MD  SEROQUEL XR 50 MG TB24 Take 50 mg by mouth at bedtime.  08/28/10   Historical Provider, MD  tiZANidine (ZANAFLEX) 4 MG tablet Take 1 tablet (4 mg total) by mouth every 6 (six) hours as needed for muscle spasms. 07/16/14   Vickki Hearing, MD  traZODone (DESYREL) 150 MG tablet Take 300 mg by mouth at bedtime.  08/25/10   Historical Provider, MD    ALLERGIES:  Allergies  Allergen Reactions  . Aspirin Other (See Comments)    esophagus disease  . Nitrofurantoin Nausea And Vomiting  . Relafen [Nabumetone] Nausea And Vomiting    SOCIAL HISTORY:  History  Substance Use Topics  . Smoking status: Current Every Day Smoker -- 2.00 packs/day for 34 years    Types: Cigarettes    Last Attempt to Quit: 09/17/2011  . Smokeless tobacco: Never Used     Comment: up to 3 ppd  . Alcohol Use: No     Comment: rarely    FAMILY HISTORY: Family History  Problem Relation Age of Onset  . Colon cancer Neg Hx   . Heart disease Mother   . Heart attack Brother 48    deceased  . Diabetes Mother   . Asthma Mother   . Lung disease Mother   . Arthritis Mother   . Allergies Mother     EXAM: BP 133/86 mmHg  Pulse 87  Temp(Src) 98.8 F (37.1 C) (Oral)  Resp 24  Ht  (1.651 m)  Wt 210 lb (95.255 kg)  BMI 34.95 kg/m2  SpO2 97% CONSTITUTIONAL: Alert and oriented and responds appropriately to questions. Well-appearing; well-nourished, nontoxic, speaking short sentences but no significant respiratory distress HEAD: Normocephalic EYES: Conjunctivae clear, PERRL ENT: normal nose; no rhinorrhea; moist mucous membranes; pharynx without lesions noted NECK: Supple, no meningismus, no LAD  CARD: RRR; S1 and S2 appreciated; no murmurs, no clicks, no rubs, no gallops RESP: Normal chest excursion without splinting, no tachypnea or significant respiratory distress, diminished aeration at her bases bilaterally, rhonchorous breath sounds  diffusely with expiratory wheezing, no rales, no hypoxia, patient is speaking short sentences ABD/GI: Normal bowel sounds; non-distended; soft, non-tender, no rebound, no guarding, no peritoneal signs BACK:  The back appears normal and is non-tender to palpation, there is no CVA tenderness EXT: Normal ROM in all joints; non-tender to palpation; no edema; normal capillary refill; no cyanosis, no calf tenderness or swelling    SKIN: Normal color for age and race; warm NEURO: Moves all extremities equally, sensation to light touch intact diffusely, cranial nerves II through XII intact PSYCH: The patient's mood and manner are appropriate. Grooming and personal hygiene are appropriate.  MEDICAL DECISION MAKING: Patient here with likely COPD exacerbation versus pneumonia. No hypoxia or respiratory distress.  We'll give continuous albuterol, Solu-Medrol. Also complaining of atypical chest pain. EKG shows no ischemic changes. We'll obtain cardiac labs, chest x-ray.  ED PROGRESS: Patient's labs unremarkable other than sodium of 129. She has received IV fluids in the ED. Troponin negative. Chest x-ray clear. She is also speaking full sentences and looks much better after getting continuous albuterol. She still has some rhonchorous breath sounds diffusely but reports she is ready to be discharged home. We'll give 1 DuoNeb treatment. She reports she has albuterol inhaler and albuterol for her nebulizer at home. We'll discharge on prednisone burst. Discussed strict return precautions. Have advised her to quit smoking. Discussed importance of PCP follow-up. She verbalizes understanding is comfortable with plan.     EKG Interpretation  Date/Time:  Friday Sep 04 2014 20:10:11 EDT Ventricular Rate:  89 PR Interval:  158 QRS Duration: 84 QT Interval:  347 QTC Calculation: 422 R Axis:   51 Text Interpretation:  Sinus rhythm LAE, consider biatrial enlargement Baseline wander in lead(s) II III aVR aVL aVF No  significant change since last tracing Confirmed by Bay Jarquin,  DO, Maxyne Derocher [phone]) on 09/04/2014 8:13:34 PM          Layla Maw Adelina Collard, DO 09/04/14 2223

## 2014-09-28 ENCOUNTER — Other Ambulatory Visit: Payer: Self-pay | Admitting: *Deleted

## 2014-09-28 ENCOUNTER — Telehealth: Payer: Self-pay | Admitting: Orthopedic Surgery

## 2014-09-28 DIAGNOSIS — M25562 Pain in left knee: Secondary | ICD-10-CM

## 2014-09-28 MED ORDER — MORPHINE SULFATE ER 15 MG PO TBCR: 15.0000 mg | EXTENDED_RELEASE_TABLET | Freq: Two times a day (BID) | ORAL | Status: AC

## 2014-09-28 MED ORDER — TIZANIDINE HCL 4 MG PO TABS: 4.0000 mg | ORAL_TABLET | Freq: Four times a day (QID) | ORAL | Status: DC | PRN

## 2014-09-28 NOTE — Telephone Encounter (Signed)
Prescription available, patient aware  

## 2014-09-28 NOTE — Telephone Encounter (Signed)
Call from patient, requests refill of medication: Morphine - MS contin 15mg  - her phone # is [phone]

## 2014-10-14 ENCOUNTER — Encounter: Payer: Self-pay | Admitting: Internal Medicine

## 2015-01-04 ENCOUNTER — Telehealth: Payer: Self-pay | Admitting: Orthopedic Surgery

## 2015-01-04 ENCOUNTER — Other Ambulatory Visit: Payer: Self-pay | Admitting: *Deleted

## 2015-01-04 MED ORDER — OXYCODONE-ACETAMINOPHEN 5-325 MG PO TABS: 1.0000 | ORAL_TABLET | ORAL | Status: DC | PRN

## 2015-01-04 NOTE — Telephone Encounter (Signed)
Patient called to request refill on pain medication: oxyCODONE-acetaminophen (ROXICET) 5-325 MG per tablet [[id number]] - ph [phone]

## 2015-01-05 NOTE — Telephone Encounter (Signed)
Prescription available, patient aware  

## 2015-01-06 NOTE — Telephone Encounter (Signed)
Patient picked up Rx

## 2015-01-21 ENCOUNTER — Ambulatory Visit (INDEPENDENT_AMBULATORY_CARE_PROVIDER_SITE_OTHER): Payer: Medicare Other | Admitting: Otolaryngology

## 2015-01-21 DIAGNOSIS — J320 Chronic maxillary sinusitis: Secondary | ICD-10-CM

## 2015-01-21 DIAGNOSIS — J310 Chronic rhinitis: Secondary | ICD-10-CM | POA: Diagnosis not present

## 2015-01-21 DIAGNOSIS — J343 Hypertrophy of nasal turbinates: Secondary | ICD-10-CM | POA: Diagnosis not present

## 2015-01-22 ENCOUNTER — Other Ambulatory Visit (INDEPENDENT_AMBULATORY_CARE_PROVIDER_SITE_OTHER): Payer: Self-pay | Admitting: Otolaryngology

## 2015-01-22 DIAGNOSIS — J329 Chronic sinusitis, unspecified: Secondary | ICD-10-CM

## 2015-01-27 ENCOUNTER — Ambulatory Visit (HOSPITAL_COMMUNITY)
Admission: RE | Admit: 2015-01-27 | Discharge: 2015-01-27 | Disposition: A | Discharge status: Home / Self Care | Payer: Medicare Other | Source: Ambulatory Visit | Attending: Otolaryngology | Admitting: Otolaryngology

## 2015-01-27 DIAGNOSIS — J329 Chronic sinusitis, unspecified: Secondary | ICD-10-CM | POA: Diagnosis present

## 2015-03-15 ENCOUNTER — Telehealth: Payer: Self-pay | Admitting: Orthopedic Surgery

## 2015-03-15 NOTE — Telephone Encounter (Signed)
Patient requests refills on the following medications: (1) Oxycodone-acetaminophen 5-325, and (2) Morphine MS-contin 15 mg; her phone# is [phone]. Her next scheduled appointment is April 6th, 2017.

## 2015-03-16 ENCOUNTER — Other Ambulatory Visit: Payer: Self-pay | Admitting: *Deleted

## 2015-03-16 DIAGNOSIS — M25562 Pain in left knee: Secondary | ICD-10-CM

## 2015-03-16 MED ORDER — MORPHINE SULFATE ER 15 MG PO TBCR: 15.0000 mg | EXTENDED_RELEASE_TABLET | Freq: Two times a day (BID) | ORAL | Status: AC

## 2015-03-16 MED ORDER — OXYCODONE-ACETAMINOPHEN 5-325 MG PO TABS: 1.0000 | ORAL_TABLET | ORAL | Status: DC | PRN

## 2015-03-16 NOTE — Telephone Encounter (Signed)
Prescription available, patient aware  

## 2015-03-22 NOTE — Telephone Encounter (Signed)
Patient picked up prescription 03/18/15. °

## 2015-04-05 ENCOUNTER — Other Ambulatory Visit: Payer: Self-pay | Admitting: Orthopedic Surgery

## 2015-04-20 NOTE — Telephone Encounter (Signed)
Patient called requesting her muscle relaxer to be refilled, patient said she has been trying to refill it for 2 weeks. Patient uses [facility] in [place]. Please advise [phone]

## 2015-04-20 NOTE — Telephone Encounter (Signed)
Routing to Jaime  

## 2015-05-19 ENCOUNTER — Other Ambulatory Visit: Payer: Self-pay | Admitting: *Deleted

## 2015-05-19 ENCOUNTER — Telehealth: Payer: Self-pay | Admitting: *Deleted

## 2015-05-19 MED ORDER — OXYCODONE-ACETAMINOPHEN 5-325 MG PO TABS: 1.0000 | ORAL_TABLET | ORAL | Status: DC | PRN

## 2015-05-19 NOTE — Telephone Encounter (Signed)
Patient called requesting her oxycodone to be refilled. Please advise

## 2015-05-19 NOTE — Telephone Encounter (Signed)
Prescription available, patient aware  

## 2015-05-27 ENCOUNTER — Other Ambulatory Visit: Payer: Self-pay | Admitting: Orthopedic Surgery

## 2015-06-10 ENCOUNTER — Telehealth: Payer: Self-pay | Admitting: Orthopedic Surgery

## 2015-06-10 NOTE — Telephone Encounter (Signed)
ROUTING TO DR HARRISON FOR APPROVAL 

## 2015-06-10 NOTE — Telephone Encounter (Signed)
Patient called and requested a refill on Tizanidine 4 mgs.

## 2015-06-11 ENCOUNTER — Other Ambulatory Visit: Payer: Self-pay | Admitting: *Deleted

## 2015-06-11 MED ORDER — TIZANIDINE HCL 4 MG PO TABS: 4.0000 mg | ORAL_TABLET | Freq: Four times a day (QID) | ORAL | Status: DC | PRN

## 2015-06-11 NOTE — Telephone Encounter (Signed)
SENT TO PHARMACY 

## 2015-06-11 NOTE — Telephone Encounter (Signed)
yes

## 2015-07-13 ENCOUNTER — Ambulatory Visit (INDEPENDENT_AMBULATORY_CARE_PROVIDER_SITE_OTHER): Payer: Medicare Other | Admitting: Orthopedic Surgery

## 2015-07-13 ENCOUNTER — Ambulatory Visit (INDEPENDENT_AMBULATORY_CARE_PROVIDER_SITE_OTHER): Payer: Medicare Other

## 2015-07-13 VITALS — BP 147/95 | Ht 65.0 in | Wt 216.0 lb

## 2015-07-13 DIAGNOSIS — G894 Chronic pain syndrome: Secondary | ICD-10-CM

## 2015-07-13 DIAGNOSIS — M25562 Pain in left knee: Secondary | ICD-10-CM

## 2015-07-13 DIAGNOSIS — Z96651 Presence of right artificial knee joint: Secondary | ICD-10-CM

## 2015-07-13 DIAGNOSIS — M25462 Effusion, left knee: Secondary | ICD-10-CM

## 2015-07-13 MED ORDER — OXYCODONE-ACETAMINOPHEN 5-325 MG PO TABS: 1.0000 | ORAL_TABLET | ORAL | Status: DC | PRN

## 2015-07-13 MED ORDER — MORPHINE SULFATE ER 15 MG PO TBCR: 15.0000 mg | EXTENDED_RELEASE_TABLET | Freq: Two times a day (BID) | ORAL | Status: DC

## 2015-07-13 MED ORDER — TIZANIDINE HCL 4 MG PO TABS: 4.0000 mg | ORAL_TABLET | Freq: Four times a day (QID) | ORAL | Status: DC | PRN

## 2015-07-13 NOTE — Progress Notes (Signed)
Patient ID: Katelyn Thompson, female   DOB: Apr 11, 1965, 50 y.o.   MRN: [id number]  Chief Complaint  Patient presents with  . Follow-up    yearly follow up + xray Rt TKA, DOS 07/01/12    HPI-3 years postop right total knee. Patient had manipulation. Did not improve knee flexion. She also has fluid on the left knee with pain loss of motion. Summation Left knee pain several years swelling dull ache severe constant  Past Medical History  Diagnosis Date  . GERD (gastroesophageal reflux disease)   . COPD (chronic obstructive pulmonary disease)   . HTN (hypertension)   . Nerve damage     Severe to left foot  . Bipolar 1 disorder   . Anxiety and depression     Does not like being around lots of people  . Hypercholesteremia   . MS (multiple sclerosis)     but not offcially disgnosed  . Barrett's esophagus without dysplasia   . Arthritis   . PONV (postoperative nausea and vomiting)   . Depression   . Anxiety   . OA (osteoarthritis) of knee 01/31/2012    ROS loss of balance No fever no chills  BP 147/95 mmHg  Ht 5\' 5"  (1.651 m)  Wt 216 lb (97.977 kg)  BMI 35.94 kg/m2  Physical Exam  Constitutional: She is oriented to person, place, and time. She appears well-developed and well-nourished. No distress.  Cardiovascular: Normal rate and intact distal pulses.   Neurological: She is alert and oriented to person, place, and time. She has normal reflexes. She exhibits normal muscle tone. Coordination normal.  Skin: Skin is warm and dry. No rash noted. She is not diaphoretic. No erythema. No pallor.  Psychiatric: She has a normal mood and affect. Her behavior is normal. Judgment and thought content normal.    Ortho Exam  Right knee incision clean dry intact no erythema no tenderness. Knee flexion is 95 full extension. Knee stable in extension and flexion. No effusion noted. Muscle tone and strength are normal.  Left knee large joint effusion. She restricts motion to about 70 of flexion  knee comes to full extension. It is stable in flexion extension. Skin is normal. Normal sensation in the left leg and good distal pulses noted  ASSESSMENT AND PLAN   Right knee x-ray taken today stable right total knee with no evidence of loosening  Left knee effusion recurrent  Recommend aspiration injection  Procedure note injection and aspiration left knee joint  Verbal consent was obtained to aspirate and inject the left knee joint   Timeout was completed to confirm the site of aspiration and injection  An 18-gauge needle was used to aspirate the left knee joint from a suprapatellar lateral approach.  The medications used were 40 mg of Depo-Medrol and 1% lidocaine 3 cc  Anesthesia was provided by ethyl chloride and the skin was prepped with alcohol.  After cleaning the skin with alcohol an 18-gauge needle was used to aspirate the right knee joint.  We obtained 45 cc of fluid  We follow this by injection of 40 mg of Depo-Medrol and 3 cc 1% lidocaine.  There were no complications. A sterile bandage was applied.

## 2015-07-15 ENCOUNTER — Ambulatory Visit: Payer: Medicare Other | Admitting: Orthopedic Surgery

## 2015-08-01 ENCOUNTER — Other Ambulatory Visit: Payer: Self-pay | Admitting: Orthopedic Surgery

## 2015-08-11 ENCOUNTER — Other Ambulatory Visit: Payer: Self-pay | Admitting: *Deleted

## 2015-08-11 MED ORDER — TIZANIDINE HCL 4 MG PO TABS: 4.0000 mg | ORAL_TABLET | Freq: Four times a day (QID) | ORAL | Status: DC | PRN

## 2015-08-16 ENCOUNTER — Other Ambulatory Visit: Payer: Self-pay | Admitting: *Deleted

## 2015-08-16 ENCOUNTER — Telehealth: Payer: Self-pay | Admitting: Orthopedic Surgery

## 2015-08-16 MED ORDER — TIZANIDINE HCL 4 MG PO TABS: 4.0000 mg | ORAL_TABLET | Freq: Four times a day (QID) | ORAL | Status: DC | PRN

## 2015-08-16 NOTE — Telephone Encounter (Signed)
Patient states that the pharmacy([facility]) in [place] has not received a  script for her Zanaflex 4mg .  I see where it was done 08/11/15, but she says they told her that they don't have it. She is down to one pill.

## 2015-08-16 NOTE — Telephone Encounter (Signed)
PRESCRIPTION RESENT

## 2015-10-04 ENCOUNTER — Other Ambulatory Visit: Payer: Self-pay | Admitting: *Deleted

## 2015-10-04 ENCOUNTER — Telehealth: Payer: Self-pay | Admitting: Orthopedic Surgery

## 2015-10-04 DIAGNOSIS — M25562 Pain in left knee: Secondary | ICD-10-CM

## 2015-10-04 MED ORDER — MORPHINE SULFATE ER 15 MG PO TBCR: 15.0000 mg | EXTENDED_RELEASE_TABLET | Freq: Two times a day (BID) | ORAL | Status: DC

## 2015-10-04 NOTE — Telephone Encounter (Signed)
ARE YOU PRESCRIBING THIS AGAIN??

## 2015-10-04 NOTE — Telephone Encounter (Signed)
YES   AND SHE NEEDS A DRUG SCREEN  DOESN'T NEED DR VISIT FOR THIS

## 2015-10-04 NOTE — Telephone Encounter (Signed)
Patient called to request refill: morphine (MS CONTIN) 15 MG 12 hr tablet [[id number]] - please advise.

## 2015-10-05 NOTE — Telephone Encounter (Signed)
Prescription available 

## 2015-11-01 ENCOUNTER — Other Ambulatory Visit: Payer: Self-pay | Admitting: *Deleted

## 2015-11-01 ENCOUNTER — Telehealth: Payer: Self-pay | Admitting: Orthopedic Surgery

## 2015-11-01 DIAGNOSIS — M25562 Pain in left knee: Secondary | ICD-10-CM

## 2015-11-01 MED ORDER — OXYCODONE-ACETAMINOPHEN 5-325 MG PO TABS: 1.0000 | ORAL_TABLET | ORAL | 0 refills | Status: DC | PRN

## 2015-11-01 MED ORDER — MORPHINE SULFATE ER 15 MG PO TBCR: 15.0000 mg | EXTENDED_RELEASE_TABLET | Freq: Two times a day (BID) | ORAL | 0 refills | Status: DC

## 2015-11-01 NOTE — Telephone Encounter (Signed)
Routing to Dr Harrison for approval 

## 2015-11-01 NOTE — Telephone Encounter (Signed)
Patient called and requested a refill on Morphine (MS Contin) 15 mgs.    Sig: Take 1 tablet (15 mg total) by mouth every 12 (twelve) hours

## 2015-11-01 NOTE — Telephone Encounter (Signed)
Needs urine test and 1 weeks worth

## 2015-11-01 NOTE — Telephone Encounter (Signed)
Same

## 2015-11-01 NOTE — Telephone Encounter (Signed)
Patient called and requested a refill on Oxycodone-Acetaminophen (Roxicet) 5-325 mgs.  Qty  180  Sig: Take 1 tablet by mouth every 4 (four) hours as needed for severe pain.

## 2015-11-03 ENCOUNTER — Other Ambulatory Visit: Payer: Self-pay | Admitting: Orthopedic Surgery

## 2015-11-03 ENCOUNTER — Telehealth: Payer: Self-pay | Admitting: *Deleted

## 2015-11-03 ENCOUNTER — Other Ambulatory Visit: Payer: Self-pay | Admitting: *Deleted

## 2015-11-03 DIAGNOSIS — G8929 Other chronic pain: Secondary | ICD-10-CM

## 2015-11-03 NOTE — Telephone Encounter (Signed)
Patient came in to pick up her prescription. A QDS was performed at that time, and patient was advised that she is going to be referred to a  pain management doctor. The patient refused stating she can not afford it. So at that time I advised her that her primary care doctor may need to take over her pain management, due to government restrictions.

## 2015-11-10 LAB — PAIN MGMT, PROFILE 1 W/O CONF, U
AMPHETAMINES: NEGATIVE ng/mL (ref <500)
BARBITURATES: NEGATIVE ng/mL (ref <300)
Benzodiazepines: POSITIVE ng/mL — AB (ref <100)
Cocaine Metabolite: NEGATIVE ng/mL (ref <150)
Creatinine: 107.4 mg/dL (ref >=20.0)
Marijuana Metabolite: NEGATIVE ng/mL (ref <20)
Methadone Metabolite: NEGATIVE ng/mL (ref <100)
OPIATES: POSITIVE ng/mL — AB (ref <100)
OXIDANT: NEGATIVE ug/mL (ref <200)
Oxycodone: POSITIVE ng/mL — AB (ref <100)
Phencyclidine: NEGATIVE ng/mL (ref <25)
pH: 7.42 (ref 4.5–9.0)

## 2015-11-24 ENCOUNTER — Telehealth: Payer: Self-pay | Admitting: Orthopedic Surgery

## 2015-11-24 ENCOUNTER — Other Ambulatory Visit: Payer: Self-pay | Admitting: *Deleted

## 2015-11-24 DIAGNOSIS — G894 Chronic pain syndrome: Secondary | ICD-10-CM

## 2015-11-24 NOTE — Telephone Encounter (Signed)
PATIENT REQUEST PAIN MANAGEMENT REFERRAL   REFERRAL SENT AS REQUESTED

## 2015-11-24 NOTE — Telephone Encounter (Signed)
Kiyoko wants Dr. Mort Sawyers nurse or him to give her a call as soon as possible.

## 2015-11-24 NOTE — Progress Notes (Unsigned)
PATIENT REQUEST PAIN MANAGEMENT   REFERRAL SENT

## 2015-11-29 ENCOUNTER — Telehealth: Payer: Self-pay | Admitting: Orthopedic Surgery

## 2015-11-29 NOTE — Telephone Encounter (Signed)
While referral to pain management is pending review/appointment, patient called to request refill on medication:  oxyCODONE-acetaminophen (ROXICET) 5-325 MG tablet 42 tablet

## 2015-11-29 NOTE — Telephone Encounter (Signed)
Spoke with patient; aware referral was made.

## 2015-11-30 ENCOUNTER — Other Ambulatory Visit: Payer: Self-pay | Admitting: *Deleted

## 2015-11-30 MED ORDER — OXYCODONE-ACETAMINOPHEN 5-325 MG PO TABS: 1.0000 | ORAL_TABLET | ORAL | 0 refills | Status: DC | PRN

## 2015-11-30 NOTE — Telephone Encounter (Signed)
Routing to Dr Harrison for approval 

## 2015-11-30 NOTE — Telephone Encounter (Signed)
approved

## 2016-01-07 ENCOUNTER — Ambulatory Visit (INDEPENDENT_AMBULATORY_CARE_PROVIDER_SITE_OTHER): Payer: Medicare Other

## 2016-01-07 ENCOUNTER — Encounter: Payer: Self-pay | Admitting: Orthopedic Surgery

## 2016-01-07 ENCOUNTER — Ambulatory Visit (INDEPENDENT_AMBULATORY_CARE_PROVIDER_SITE_OTHER): Payer: Medicare Other | Admitting: Orthopedic Surgery

## 2016-01-07 VITALS — BP 121/74 | HR 81 | Wt 212.0 lb

## 2016-01-07 DIAGNOSIS — G894 Chronic pain syndrome: Secondary | ICD-10-CM

## 2016-01-07 DIAGNOSIS — M25462 Effusion, left knee: Secondary | ICD-10-CM | POA: Diagnosis not present

## 2016-01-07 DIAGNOSIS — M1712 Unilateral primary osteoarthritis, left knee: Secondary | ICD-10-CM

## 2016-01-07 DIAGNOSIS — M25562 Pain in left knee: Secondary | ICD-10-CM

## 2016-01-07 MED ORDER — OXYCODONE-ACETAMINOPHEN 5-325 MG PO TABS: 1.0000 | ORAL_TABLET | Freq: Four times a day (QID) | ORAL | 0 refills | Status: DC | PRN

## 2016-01-07 NOTE — Progress Notes (Signed)
Established patient  Chief complaint Chief Complaint  Patient presents with  . Knee Problem    LEFT KNEE SWELLING   History. 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 Presents for reevaluation of left knee pain and swelling recurrent. Patient is having symptoms over the last year had her aspiration injection and may. Complains of dull throbbing pain left knee anterior and posterior primarily medial joint with intermittent swelling and pain with exercise  Review of systems no unexpected weight loss fever or chills. She does have depression she lost her mom 1 month ago. She has occasional back pain with leg pain  Past Medical History:  Diagnosis Date  . Anxiety   . Anxiety and depression    Does not like being around lots of people  . Arthritis   . Barrett's esophagus without dysplasia   . Bipolar 1 disorder (HCC)   . COPD (chronic obstructive pulmonary disease) (HCC)   . Depression   . GERD (gastroesophageal reflux disease)   . HTN (hypertension)   . Hypercholesteremia   . MS (multiple sclerosis) (HCC)    but not offcially disgnosed  . Nerve damage    Severe to left foot  . OA (osteoarthritis) of knee 01/31/2012  . PONV (postoperative nausea and vomiting)     BP 121/74   Pulse 81   Wt 212 lb (96.2 kg)   BMI 35.28 kg/m  Her appearance is normal she is well-groomed. She is oriented to person place and time mood is flat her affect is flat and depressed she ambulates with a limp favoring her left knee  Her left knee is swollen with a moderate effusion. Her range of motion is 125 her knee is stable she has good muscle tone in the quadriceps. There is no erythema over the skin of the left knee she has good distal pulses no edema normal sensation.  The right knee has a straight incision from her previous right total knee with no swelling in flexion of 105  Today's x-ray shows moderate arthritis of the medial  compartment mild arthritis of the lateral compartment with joint effusion  Impression Left knee joint effusion Chronic pain syndrome Left knee arthritis  Encounter Diagnoses  Name Primary?  . Left knee pain Yes  . Chronic pain syndrome   . Effusion of knee joint, left   . Primary osteoarthritis of left knee     Plan The patient was referred to pain management. They can get in touch with her so will make another referral She was given a prescription to last 1 month with appropriate data bank inquiry  Aspiration injection left knee  Procedure note injection and aspiration left knee joint  Verbal consent was obtained to aspirate and inject the left knee joint   Timeout was completed to confirm the site of aspiration and injection  An 18-gauge needle was used to aspirate the left knee joint from a suprapatellar lateral approach.  The medications used were 40 mg of Depo-Medrol and 1% lidocaine 3 cc  Anesthesia was provided by ethyl chloride and the skin was prepped with alcohol.  After cleaning the skin with alcohol an 18-gauge needle was used to aspirate the right knee joint.  We obtained 70 cc of yellow fluid clear   We follow this by injection of 40 mg of Depo-Medrol and 3 cc 1% lidocaine.  There were no complications. A sterile bandage was applied.

## 2016-01-07 NOTE — Patient Instructions (Signed)

## 2016-02-04 ENCOUNTER — Ambulatory Visit: Payer: Medicare Other | Admitting: Physical Medicine & Rehabilitation

## 2016-02-11 ENCOUNTER — Encounter: Payer: Self-pay | Admitting: Physical Medicine & Rehabilitation

## 2016-02-11 ENCOUNTER — Encounter: Payer: Medicare Other | Attending: Physical Medicine & Rehabilitation

## 2016-02-11 ENCOUNTER — Ambulatory Visit (HOSPITAL_BASED_OUTPATIENT_CLINIC_OR_DEPARTMENT_OTHER): Payer: Medicare Other | Admitting: Physical Medicine & Rehabilitation

## 2016-02-11 VITALS — BP 138/85 | HR 88 | Resp 14

## 2016-02-11 DIAGNOSIS — F319 Bipolar disorder, unspecified: Secondary | ICD-10-CM | POA: Insufficient documentation

## 2016-02-11 DIAGNOSIS — F419 Anxiety disorder, unspecified: Secondary | ICD-10-CM | POA: Diagnosis not present

## 2016-02-11 DIAGNOSIS — Z96651 Presence of right artificial knee joint: Secondary | ICD-10-CM | POA: Diagnosis not present

## 2016-02-11 DIAGNOSIS — G8929 Other chronic pain: Secondary | ICD-10-CM | POA: Diagnosis present

## 2016-02-11 DIAGNOSIS — M1712 Unilateral primary osteoarthritis, left knee: Secondary | ICD-10-CM | POA: Insufficient documentation

## 2016-02-11 DIAGNOSIS — M25562 Pain in left knee: Secondary | ICD-10-CM | POA: Diagnosis not present

## 2016-02-11 DIAGNOSIS — M545 Low back pain: Secondary | ICD-10-CM | POA: Diagnosis not present

## 2016-02-11 DIAGNOSIS — J449 Chronic obstructive pulmonary disease, unspecified: Secondary | ICD-10-CM | POA: Diagnosis not present

## 2016-02-11 DIAGNOSIS — F329 Major depressive disorder, single episode, unspecified: Secondary | ICD-10-CM | POA: Insufficient documentation

## 2016-02-11 DIAGNOSIS — M47816 Spondylosis without myelopathy or radiculopathy, lumbar region: Secondary | ICD-10-CM

## 2016-02-11 DIAGNOSIS — I10 Essential (primary) hypertension: Secondary | ICD-10-CM | POA: Insufficient documentation

## 2016-02-11 DIAGNOSIS — M1288 Other specific arthropathies, not elsewhere classified, other specified site: Secondary | ICD-10-CM

## 2016-02-11 DIAGNOSIS — E7800 Pure hypercholesterolemia, unspecified: Secondary | ICD-10-CM | POA: Diagnosis not present

## 2016-02-11 DIAGNOSIS — M25561 Pain in right knee: Secondary | ICD-10-CM

## 2016-02-11 DIAGNOSIS — Z5181 Encounter for therapeutic drug level monitoring: Secondary | ICD-10-CM

## 2016-02-11 DIAGNOSIS — G35 Multiple sclerosis: Secondary | ICD-10-CM | POA: Diagnosis not present

## 2016-02-11 DIAGNOSIS — F17210 Nicotine dependence, cigarettes, uncomplicated: Secondary | ICD-10-CM | POA: Diagnosis not present

## 2016-02-11 DIAGNOSIS — K219 Gastro-esophageal reflux disease without esophagitis: Secondary | ICD-10-CM | POA: Diagnosis not present

## 2016-02-11 DIAGNOSIS — K2270 Barrett's esophagus without dysplasia: Secondary | ICD-10-CM | POA: Insufficient documentation

## 2016-02-11 MED ORDER — TRAMADOL HCL 50 MG PO TABS: 50.0000 mg | ORAL_TABLET | Freq: Three times a day (TID) | ORAL | 1 refills | Status: DC | PRN

## 2016-02-11 MED ORDER — DICLOFENAC SODIUM 1 % TD GEL: 2.0000 g | Freq: Four times a day (QID) | TRANSDERMAL | 2 refills | Status: DC

## 2016-02-11 NOTE — Progress Notes (Signed)
Subjective:    Patient ID: Katelyn Thompson, female    DOB: [birth year], 50 y.o.   MRN: [id number]  HPI Chief complaint is knee pain and back pain.  History of right TKR with postoperative contracture. Surgery was done approximately 3 years ago.  Patient has continued postoperative pain despite TKR. Left knee pain equals right knee pain Has undergone left knee arthrocentesis as well as injection last performed 01/07/2016 by orthopedics  Patient reports poor sleep, had been on Seroquel prescribed by her psychiatrist. She is no longer taking this. She is no longer seeing psychiatry for diagnosis of bipolar disorder.  Oxycodone 5mg  TID No longer taking morphine Cyclobenzaprine 10 mg twice a day, prescribed by orthopedics  Reviewed MRI, performed 2015 of the left knee. Cartilage thinning, medial femoral condyle, medial tibial plateau, also cartilage loss, lateral femoral condyle, small lateral meniscal tear  Last x-ray of the left knee performed 01/07/2016. Mild medial joint space arthritis, positive effusion  Lumbar MRI. 2007 showed facet arthropathy L2-3, L3-4, L4-5, L5-S1. No evidence of stenosis. L4-5 was most affected  Chronic constipation needs to use Fleet's enemas twice a week   Pain Inventory Average Pain 10 Pain Right Now 10 My pain is constant, sharp, burning, dull, stabbing and aching  In the last 24 hours, has pain interfered with the following? General activity 10 Relation with others 8 Enjoyment of life 10 What TIME of day is your pain at its worst? all Sleep (in general) Poor  Pain is worse with: walking, bending, sitting, standing and some activites Pain improves with: rest and medication Relief from Meds: 9  Mobility walk without assistance walk with assistance use a cane use a walker how many minutes can you walk? 30 ability to climb steps?  no do you drive?  yes  Function disabled: date disabled n/a I need assistance with the following:  meal prep,  household duties and shopping  Neuro/Psych bladder control problems bowel control problems No problems in this area tingling trouble walking spasms dizziness depression anxiety  Prior Studies Any changes since last visit?  no  Physicians involved in your care Any changes since last visit?  no   Family History  Problem Relation Age of Onset  . Colon cancer Neg Hx   . Heart disease Mother   . Heart attack Brother 48    deceased  . Diabetes Mother   . Asthma Mother   . Lung disease Mother   . Arthritis Mother   . Allergies Mother    Social History   Social History  . Marital status: Married    Spouse name: N/A  . Number of children: N/A  . Years of education: 9th grade   Occupational History  . disabled Not Employed   Social History Main Topics  . Smoking status: Current Every Day Smoker    Packs/day: 2.00    Years: 34.00    Types: Cigarettes    Last attempt to quit: 09/17/2011  . Smokeless tobacco: Never Used     Comment: up to 3 ppd  . Alcohol use No     Comment: rarely  . Drug use: No  . Sexual activity: Yes    Birth control/ protection: Surgical   Other Topics Concern  . None   Social History Narrative  . None   Past Surgical History:  Procedure Laterality Date  . ABDOMINAL HYSTERECTOMY    . ANKLE ARTHROSCOPY WITH DRILLING/MICROFRACTURE  02/26/2012   Procedure: ANKLE ARTHROSCOPY WITH DRILLING/MICROFRACTURE;  Surgeon:  Vickki Hearing, MD;  Location: [facility];  Service: Orthopedics;  Laterality: Right;  . COLONOSCOPY  11/20/2011   RMR: Friable anorectum; single anal canal hemorrhoidal tag. Otherwise normal rectum and colon  . ESOPHAGOGASTRODUODENOSCOPY  06/2010   diagnosed with Barrett's, small hh, esophagus dilated. Next EGD 06/2011  . ESOPHAGOGASTRODUODENOSCOPY  Aug 2013   RMR: abnormal distal esophagus consistent with prior dx of SS Barrett;s. s/p dilation. small hiatal hernia. Biopsies: no metaplasia, dysplasia, malignancy. SURVEILLANCE AUG 2016   . EXAM UNDER ANESTHESIA WITH MANIPULATION OF KNEE Right 09/16/2012   Procedure: EXAM UNDER ANESTHESIA WITH MANIPULATION OF RIGHT KNEE;  Surgeon: Vickki Hearing, MD;  Location: [facility];  Service: Orthopedics;  Laterality: Right;  . FOOT SURGERY     x 2  . KNEE ARTHROSCOPY  02/26/2012   Procedure: ARTHROSCOPY KNEE;  Surgeon: Vickki Hearing, MD;  Location: [facility];  Service: Orthopedics;  Laterality: Right;  with three chondroplasties  . KNEE ARTHROTOMY Left 11/21/2013   Procedure: KNEE ARTHROSCOPY  WITH MEDIAL, LATERAL, AND PATELLA  FEMORAL CHONDROPLASTY;  Surgeon: Vickki Hearing, MD;  Location: [facility];  Service: Orthopedics;  Laterality: Left;  . s/p hysterectomy    . SINUS SURGERY WITH INSTATRAK    . SURGERY OF LIP     biopsy  . TOTAL KNEE ARTHROPLASTY Right 07/01/2012   Procedure: TOTAL KNEE ARTHROPLASTY;  Surgeon: Vickki Hearing, MD;  Location: [facility];  Service: Orthopedics;  Laterality: Right;  . TUBAL LIGATION     Past Medical History:  Diagnosis Date  . Anxiety   . Anxiety and depression    Does not like being around lots of people  . Arthritis   . Barrett's esophagus without dysplasia   . Bipolar 1 disorder (HCC)   . COPD (chronic obstructive pulmonary disease) (HCC)   . Depression   . GERD (gastroesophageal reflux disease)   . HTN (hypertension)   . Hypercholesteremia   . MS (multiple sclerosis) (HCC)    but not offcially disgnosed  . Nerve damage    Severe to left foot  . OA (osteoarthritis) of knee 01/31/2012  . PONV (postoperative nausea and vomiting)    BP 138/85   Pulse 88   Resp 14   SpO2 94%   Opioid Risk Score:  9 reported by CMA Fall Risk Score:  `1  Depression screen PHQ 2/9  Depression screen PHQ 2/9 02/11/2016  Decreased Interest 3  Down, Depressed, Hopeless 3  PHQ - 2 Score 6  Altered sleeping 2  Tired, decreased energy 2  Change in appetite 2  Feeling bad or failure about yourself  3  Trouble concentrating 2  Moving slowly or  fidgety/restless 2  Suicidal thoughts 1  PHQ-9 Score 20    Review of Systems  Constitutional: Positive for chills, fever and unexpected weight change.  Respiratory: Positive for cough, shortness of breath and wheezing.        Respiratory infections   Gastrointestinal: Positive for constipation and nausea.  Musculoskeletal:       Limb swelling   All other systems reviewed and are negative.      Objective:   Physical Exam Lumbar range of motion 50% flexion, extension, lateral bending and rotation No tenderness to palpation. Lumbar spine. Right knee Flexion to 60, extension to 0  Left knee Flexion to 20, extension to 0  Right ankle dorsiflexion to 5 Left ankle dorsiflexion to 20  Normal sensation to pinprick C5, C6-C7, C8, L2, L3, L4, L5-S1  dermatomal distribution  Strength is 5/5 bilateral deltoid, biceps, triceps, grip, hip flexion, knee extension 4/5. Ankle dorsi flexion on the right than the right ankle contracture, 5/5 left side.      Assessment & Plan:  1.  Chronic bilateral knee pain, she is status post right TKR with chronic contracture, left knee appears to be mainly osteochondral deficits with only mild osteoarthritis medial compartment. Recommend Voltaren gel 4 times a day Left knee orthosis. Prescription written to have [facility] orthotist performed custom fit. Given elevated opioid risk would not prescribe schedule II or III narcotic analgesics. Start Tramadol 50 mg 3 times a day, would not rx hi dosegiven that she is already on trazodone at night, risk for serotonin syndrome 2. Chronic low back pain, last imaging study, consistent with facet arthropathy. Typically, L4-L5. She has no radicular symptoms toward MRI at this time. Would recommend medial branch blocks, bilateral L3, L4 and L5 dorsal ramus injection under fluoroscopic guidance. Discussed with patient agrees with the plan outlined above

## 2016-02-11 NOTE — Patient Instructions (Signed)
I have prescribed a custom molded knee brace. You can call the place on the prescription or just dropped by there to get fitted  You may pick up the prescription for the knee cream at  Pharmacy  Back injections called medial branch blocks may be helpful since I think you have arthritis in your lumbar spine

## 2016-02-15 ENCOUNTER — Other Ambulatory Visit: Payer: Self-pay | Admitting: Orthopedic Surgery

## 2016-02-17 ENCOUNTER — Telehealth: Payer: Self-pay | Admitting: *Deleted

## 2016-02-17 NOTE — Telephone Encounter (Signed)
[facility] [place] voltaren gel 1%

## 2016-02-18 LAB — TOXASSURE SELECT,+ANTIDEPR,UR

## 2016-02-25 ENCOUNTER — Ambulatory Visit: Payer: Medicare Other | Admitting: Orthopedic Surgery

## 2016-02-28 ENCOUNTER — Encounter: Payer: Self-pay | Admitting: Physical Medicine & Rehabilitation

## 2016-02-28 ENCOUNTER — Ambulatory Visit (HOSPITAL_BASED_OUTPATIENT_CLINIC_OR_DEPARTMENT_OTHER): Payer: Medicare Other | Admitting: Physical Medicine & Rehabilitation

## 2016-02-28 VITALS — BP 120/81 | HR 70 | Resp 14

## 2016-02-28 DIAGNOSIS — M47816 Spondylosis without myelopathy or radiculopathy, lumbar region: Secondary | ICD-10-CM

## 2016-02-28 DIAGNOSIS — G8929 Other chronic pain: Secondary | ICD-10-CM | POA: Diagnosis not present

## 2016-02-28 DIAGNOSIS — M1288 Other specific arthropathies, not elsewhere classified, other specified site: Secondary | ICD-10-CM | POA: Diagnosis not present

## 2016-02-28 NOTE — Progress Notes (Signed)

## 2016-02-28 NOTE — Progress Notes (Signed)
  PROCEDURE RECORD [facility]   Name: Katelyn Thompson DOB:06/07/1965 MRN: [id number]  Date:02/28/2016  Physician: Claudette Laws, MD    Nurse/CMA: Bing Plume  Allergies:  Allergies  Allergen Reactions  . Aspirin Other (See Comments)    esophagus disease  . Nitrofurantoin Nausea And Vomiting  . Relafen [Nabumetone] Nausea And Vomiting    Consent Signed: Yes.    Is patient diabetic? No.  CBG today?   Pregnant: No. LMP: No LMP recorded. Patient has had a hysterectomy. (age [age])  Anticoagulants: no Anti-inflammatory: no Antibiotics: yes (Amoxillcin )  Procedure: Bilateral Medail Branch Block Position: Prone Start Time: 1426 End Time: 1436  Fluoro Time: 45  RN/CMA Gillermo Murdoch    Time 1358 1441    BP 120/81 128/84    Pulse 69 71    Respirations 14 14    O2 Sat 98 98    S/S 6 6    Pain Level 10 4     D/C home with Husband, patient A & O X 3, D/C instructions reviewed, and sits independently.

## 2016-02-28 NOTE — Patient Instructions (Signed)

## 2016-03-27 ENCOUNTER — Ambulatory Visit (INDEPENDENT_AMBULATORY_CARE_PROVIDER_SITE_OTHER): Payer: Medicare Other | Admitting: Orthopedic Surgery

## 2016-03-27 ENCOUNTER — Encounter: Payer: Self-pay | Admitting: Orthopedic Surgery

## 2016-03-27 DIAGNOSIS — M25562 Pain in left knee: Secondary | ICD-10-CM

## 2016-03-27 DIAGNOSIS — G8929 Other chronic pain: Secondary | ICD-10-CM | POA: Diagnosis not present

## 2016-03-27 DIAGNOSIS — M25462 Effusion, left knee: Secondary | ICD-10-CM

## 2016-03-27 NOTE — Progress Notes (Signed)
Follow-up left knee aspiration injection  Patient has recurrent swelling in the left knee  Left knee aspirated 40 mL of fluid injected 40 mg of Depo-Medrol  Procedure note injection and aspiration left knee joint  Verbal consent was obtained to aspirate and inject the left knee joint   Timeout was completed to confirm the site of aspiration and injection  An 18-gauge needle was used to aspirate the left knee joint from a suprapatellar lateral approach.  The medications used were 40 mg of Depo-Medrol and 1% lidocaine 3 cc  Anesthesia was provided by ethyl chloride and the skin was prepped with alcohol.  After cleaning the skin with alcohol an 18-gauge needle was used to aspirate the right knee joint.  We obtained 40 cc of fluid  We follow this by injection of 40 mg of Depo-Medrol and 3 cc 1% lidocaine.  There were no complications. A sterile bandage was applied.

## 2016-03-28 ENCOUNTER — Ambulatory Visit: Payer: Medicare Other | Admitting: Physical Medicine & Rehabilitation

## 2016-04-11 ENCOUNTER — Other Ambulatory Visit: Payer: Self-pay

## 2016-04-11 ENCOUNTER — Telehealth: Payer: Self-pay | Admitting: Orthopedic Surgery

## 2016-04-11 MED ORDER — TRAMADOL HCL 50 MG PO TABS: 50.0000 mg | ORAL_TABLET | Freq: Three times a day (TID) | ORAL | 1 refills | Status: DC | PRN

## 2016-04-11 NOTE — Telephone Encounter (Signed)
Ms. Errera called and wanted to know if we could refer her to Dr. Laurian Brim in [place] for pain management.

## 2016-04-12 ENCOUNTER — Other Ambulatory Visit: Payer: Self-pay | Admitting: *Deleted

## 2016-04-12 DIAGNOSIS — G894 Chronic pain syndrome: Secondary | ICD-10-CM

## 2016-04-12 NOTE — Telephone Encounter (Signed)
IN PROCESS

## 2016-04-25 ENCOUNTER — Ambulatory Visit: Payer: Medicare Other | Admitting: Physical Medicine & Rehabilitation

## 2016-05-18 ENCOUNTER — Telehealth: Payer: Self-pay | Admitting: Orthopedic Surgery

## 2016-05-18 NOTE — Telephone Encounter (Signed)
Please advise sorry for delay but we did hear back and they declined referral due to her insurance. She will need to continue seeing current pain management doctor.

## 2016-05-18 NOTE — Telephone Encounter (Signed)
Patient called and wanted to know the status of her referral to Dr. Jon Gills office.  She says she is in a lot of pain and has not heard from anyone.  Please check on this and call her.  Thanks

## 2016-05-29 ENCOUNTER — Encounter: Payer: Self-pay | Admitting: Orthopedic Surgery

## 2016-05-29 ENCOUNTER — Ambulatory Visit (INDEPENDENT_AMBULATORY_CARE_PROVIDER_SITE_OTHER): Payer: Medicare Other | Admitting: Orthopedic Surgery

## 2016-05-29 ENCOUNTER — Ambulatory Visit (HOSPITAL_COMMUNITY)
Admission: RE | Admit: 2016-05-29 | Discharge: 2016-05-29 | Disposition: A | Discharge status: Home / Self Care | Payer: Medicare Other | Source: Ambulatory Visit | Attending: Orthopedic Surgery | Admitting: Orthopedic Surgery

## 2016-05-29 ENCOUNTER — Ambulatory Visit: Payer: Medicare Other

## 2016-05-29 DIAGNOSIS — M25462 Effusion, left knee: Secondary | ICD-10-CM | POA: Insufficient documentation

## 2016-05-29 DIAGNOSIS — M25562 Pain in left knee: Secondary | ICD-10-CM | POA: Diagnosis not present

## 2016-05-29 DIAGNOSIS — M1712 Unilateral primary osteoarthritis, left knee: Secondary | ICD-10-CM | POA: Diagnosis not present

## 2016-05-29 DIAGNOSIS — S83002A Unspecified subluxation of left patella, initial encounter: Secondary | ICD-10-CM

## 2016-05-29 NOTE — Progress Notes (Signed)
Patient ID: CORALINE TALWAR, female   DOB: Mar 10, 1966, 51 y.o.   MRN: [id number]  Chief Complaint  Patient presents with  . Follow-up    left knee pain s/p pop last week    HPI Nimco Bivens Haefner is a 51 y.o. female.  51 year old female presents with pain in her left knee after the knee popped last week. She had severe pain swelling some loss of motion and dull throbbing pain  Review of Systems Review of Systems  Constitutional: Negative for fever.  Neurological: Negative for numbness.   (2 MINIMUM)  Past Medical History:  Diagnosis Date  . Anxiety   . Anxiety and depression    Does not like being around lots of people  . Arthritis   . Barrett's esophagus without dysplasia   . Bipolar 1 disorder (HCC)   . COPD (chronic obstructive pulmonary disease) (HCC)   . Depression   . GERD (gastroesophageal reflux disease)   . HTN (hypertension)   . Hypercholesteremia   . MS (multiple sclerosis) (HCC)    but not offcially disgnosed  . Nerve damage    Severe to left foot  . OA (osteoarthritis) of knee 01/31/2012  . PONV (postoperative nausea and vomiting)     Past Surgical History:  Procedure Laterality Date  . ABDOMINAL HYSTERECTOMY    . ANKLE ARTHROSCOPY WITH DRILLING/MICROFRACTURE  02/26/2012   Procedure: ANKLE ARTHROSCOPY WITH DRILLING/MICROFRACTURE;  Surgeon: Vickki Hearing, MD;  Location: [facility];  Service: Orthopedics;  Laterality: Right;  . COLONOSCOPY  11/20/2011   RMR: Friable anorectum; single anal canal hemorrhoidal tag. Otherwise normal rectum and colon  . ESOPHAGOGASTRODUODENOSCOPY  06/2010   diagnosed with Barrett's, small hh, esophagus dilated. Next EGD 06/2011  . ESOPHAGOGASTRODUODENOSCOPY  Aug 2013   RMR: abnormal distal esophagus consistent with prior dx of SS Barrett;s. s/p dilation. small hiatal hernia. Biopsies: no metaplasia, dysplasia, malignancy. SURVEILLANCE AUG 2016  . EXAM UNDER ANESTHESIA WITH MANIPULATION OF KNEE Right 09/16/2012   Procedure: EXAM UNDER  ANESTHESIA WITH MANIPULATION OF RIGHT KNEE;  Surgeon: Vickki Hearing, MD;  Location: [facility];  Service: Orthopedics;  Laterality: Right;  . FOOT SURGERY     x 2  . KNEE ARTHROSCOPY  02/26/2012   Procedure: ARTHROSCOPY KNEE;  Surgeon: Vickki Hearing, MD;  Location: [facility];  Service: Orthopedics;  Laterality: Right;  with three chondroplasties  . KNEE ARTHROTOMY Left 11/21/2013   Procedure: KNEE ARTHROSCOPY  WITH MEDIAL, LATERAL, AND PATELLA  FEMORAL CHONDROPLASTY;  Surgeon: Vickki Hearing, MD;  Location: [facility];  Service: Orthopedics;  Laterality: Left;  . s/p hysterectomy    . SINUS SURGERY WITH INSTATRAK    . SURGERY OF LIP     biopsy  . TOTAL KNEE ARTHROPLASTY Right 07/01/2012   Procedure: TOTAL KNEE ARTHROPLASTY;  Surgeon: Vickki Hearing, MD;  Location: [facility];  Service: Orthopedics;  Laterality: Right;  . TUBAL LIGATION      Social History Social History  Substance Use Topics  . Smoking status: Current Every Day Smoker    Packs/day: 2.00    Years: 34.00    Types: Cigarettes    Last attempt to quit: 09/17/2011  . Smokeless tobacco: Never Used     Comment: up to 3 ppd  . Alcohol use No     Comment: rarely    Allergies  Allergen Reactions  . Aspirin Other (See Comments)    esophagus disease  . Nitrofurantoin Nausea And Vomiting  . Relafen [Nabumetone] Nausea And  Vomiting    Current Meds  Medication Sig  . albuterol (PROVENTIL) (2.5 MG/3ML) 0.083% nebulizer solution Take 2.5 mg by nebulization every 6 (six) hours as needed for shortness of breath.   . ALPRAZolam (XANAX) 1 MG tablet Take 1 mg by mouth 3 (three) times daily as needed for anxiety.   Marland Kitchen amitriptyline (ELAVIL) 25 MG tablet Take 25 mg by mouth at bedtime.    Marland Kitchen amLODipine (NORVASC) 10 MG tablet Take 10 mg by mouth daily.  . cyclobenzaprine (FLEXERIL) 10 MG tablet Take 1 tablet (10 mg total) by mouth 3 (three) times daily as needed for muscle spasms.  Marland Kitchen dexlansoprazole (DEXILANT) 60 MG capsule Take 1  capsule (60 mg total) by mouth daily.  Marland Kitchen dextromethorphan-guaiFENesin (MUCINEX DM) 30-600 MG per 12 hr tablet Take 1 tablet by mouth every 12 (twelve) hours.    . EPITOL 200 MG tablet Take 200 mg by mouth 2 (two) times daily.  . ergocalciferol (VITAMIN D2) 50000 UNITS capsule Take 50,000 Units by mouth once a week. On fridays  . hydrochlorothiazide (MICROZIDE) 12.5 MG capsule Take 1 capsule by mouth daily.  Marland Kitchen lamoTRIgine (LAMICTAL) 100 MG tablet Take 100 mg by mouth every morning.   . Linaclotide 290 MCG CAPS Take 1 capsule by mouth daily. 30 minutes before a meal  . lisinopril (PRINIVIL,ZESTRIL) 10 MG tablet Take 20 mg by mouth every morning.   . Mometasone Furo-Formoterol Fum (DULERA) 200-5 MCG/ACT AERO Inhale 2 puffs into the lungs 2 (two) times daily.   Marland Kitchen omeprazole (PRILOSEC) 40 MG capsule Take 40 mg by mouth 2 (two) times daily.  . pantoprazole (PROTONIX) 40 MG tablet Take 1 tablet (40 mg total) by mouth 2 (two) times daily.  . QUEtiapine (SEROQUEL) 25 MG tablet Take 50 mg by mouth at bedtime.   . senna-docusate (SENOKOT-S) 8.6-50 MG per tablet Take 1 tablet by mouth at bedtime as needed.  . SEROQUEL XR 50 MG TB24 Take 50 mg by mouth at bedtime.   Marland Kitchen tiZANidine (ZANAFLEX) 4 MG tablet Take 1 tablet (4 mg total) by mouth every 6 (six) hours as needed for muscle spasms.  Marland Kitchen tiZANidine (ZANAFLEX) 4 MG tablet TAKE ONE TABLET BY MOUTH EVERY 6 HOURS AS NEEDED FOR MUSCLE SPASMS  . traMADol (ULTRAM) 50 MG tablet Take 1 tablet (50 mg total) by mouth every 8 (eight) hours as needed.  . traZODone (DESYREL) 150 MG tablet Take 300 mg by mouth at bedtime.   Marland Kitchen Umeclidinium Bromide 62.5 MCG/INH AEPB Inhale 1 puff into the lungs daily.      Physical Exam Physical Exam 1.There were no vitals taken for this visit.  2. Gen. appearance. The patient is well-developed and well-nourished, grooming and hygiene are normal. There are no gross congenital abnormalities  3. The patient is alert and oriented to  person place and time  4. Mood and affect are normal  5. Ambulation Supported by cane and there is no sublingual limp favoring the left leg  Examination reveals the following: 6. On inspection we find large effusion left knee with patellofemoral tenderness and pain to palpation  7. With the range of motion of  15-90  8. Stability tests were normal  for the anterior cruciate ligament PCL collateral ligaments but she had patellofemoral apprehension without subluxation  9. Strength tests revealed grade 5 motor strength  10. Skin we find no rash ulceration or erythema  11. Sensation remains intact  12 Vascular system shows no peripheral edema  Right knee is  noted for anterior incision from total knee no warmth erythema range of motion 0-90  MEDICAL DECISION MAKING:    Data Reviewed X-ray of the left knee shows osteoarthritis all 3 compartments, there is comment of a loose body I think this is just old degenerative changes there is also an effusion  Assessment Patellofemoral subluxation  Aspiration injection and bracing left knee  Procedure note injection and aspiration left knee joint  Verbal consent was obtained to aspirate and inject the left knee joint   Timeout was completed to confirm the site of aspiration and injection  An 18-gauge needle was used to aspirate the left knee joint from a suprapatellar lateral approach.  The medications used were 40 mg of Depo-Medrol and 1% lidocaine 3 cc  Anesthesia was provided by ethyl chloride and the skin was prepped with alcohol.  After cleaning the skin with alcohol an 18-gauge needle was used to aspirate the right knee joint.  We obtained 38 cc of fluid  We follow this by injection of 40 mg of Depo-Medrol and 3 cc 1% lidocaine.  There were no complications. A sterile bandage was applied.   Return 6 weeks  Plan Wear brace for 6 weeks ambulation weightbearing as tolerated use cane and support  Fuller Canada 05/29/2016, 9:17 AM

## 2016-05-29 NOTE — Patient Instructions (Signed)

## 2016-06-08 ENCOUNTER — Emergency Department (HOSPITAL_COMMUNITY): Payer: Medicare Other

## 2016-06-08 ENCOUNTER — Encounter (HOSPITAL_COMMUNITY): Payer: Self-pay | Admitting: *Deleted

## 2016-06-08 ENCOUNTER — Emergency Department (HOSPITAL_COMMUNITY)
Admission: EM | Admit: 2016-06-08 | Discharge: 2016-06-08 | Disposition: A | Discharge status: Home / Self Care | Payer: Medicare Other | Attending: Emergency Medicine | Admitting: Emergency Medicine

## 2016-06-08 DIAGNOSIS — Z87891 Personal history of nicotine dependence: Secondary | ICD-10-CM | POA: Insufficient documentation

## 2016-06-08 DIAGNOSIS — J449 Chronic obstructive pulmonary disease, unspecified: Secondary | ICD-10-CM | POA: Diagnosis not present

## 2016-06-08 DIAGNOSIS — Z79899 Other long term (current) drug therapy: Secondary | ICD-10-CM | POA: Insufficient documentation

## 2016-06-08 DIAGNOSIS — M25562 Pain in left knee: Secondary | ICD-10-CM | POA: Insufficient documentation

## 2016-06-08 DIAGNOSIS — G8929 Other chronic pain: Secondary | ICD-10-CM | POA: Diagnosis not present

## 2016-06-08 DIAGNOSIS — I10 Essential (primary) hypertension: Secondary | ICD-10-CM | POA: Insufficient documentation

## 2016-06-08 MED ORDER — HYDROCODONE-ACETAMINOPHEN 5-325 MG PO TABS: 1.0000 | ORAL_TABLET | Freq: Four times a day (QID) | ORAL | 0 refills | Status: DC | PRN

## 2016-06-08 NOTE — ED Provider Notes (Signed)
[facility] Provider Note   CSN: [id number] Arrival date & time: 06/08/16  1706     History   Chief Complaint Chief Complaint  Patient presents with  . Knee Pain    HPI Katelyn Thompson is a 51 y.o. female.  Patient presents to the emergency department with chief complaint of left knee pain and swelling. She states that she gets frequent fluid collections on her knee, and has to have them drained by her orthopedic doctor. She states that when the fluid collection becomes severe, and feels like it because her need to pop out of place. She states that she noticed this sensation last night.   She has been using a brace with little relief.  The symptoms are worsened with movement and palpation.   The history is provided by the patient. No language interpreter was used.    Past Medical History:  Diagnosis Date  . Anxiety   . Anxiety and depression    Does not like being around lots of people  . Arthritis   . Barrett's esophagus without dysplasia   . Bipolar 1 disorder (HCC)   . COPD (chronic obstructive pulmonary disease) (HCC)   . Depression   . GERD (gastroesophageal reflux disease)   . HTN (hypertension)   . Hypercholesteremia   . MS (multiple sclerosis) (HCC)    but not offcially disgnosed  . Nerve damage    Severe to left foot  . OA (osteoarthritis) of knee 01/31/2012  . PONV (postoperative nausea and vomiting)     Patient Active Problem List   Diagnosis Date Noted  . Lumbar facet arthropathy 02/11/2016  . Meniscus, lateral, derangement 10/27/2013  . Primary osteoarthritis of left knee 10/27/2013  . Contusion of right knee 09/23/2013  . Derangement of posterior horn of medial meniscus 09/23/2013  . Right knee pain 09/23/2013  . Effusion of knee joint, left 04/29/2013  . Iliotibial band tendonitis 04/29/2013  . Bilateral chronic knee pain 02/12/2013  . Joint stiffness of knee 09/13/2012  . S/P total knee replacement 08/13/2012  . Total knee replacement  status 07/15/2012  . Hypertension 02/20/2012  . Effusion of knee joint 01/31/2012  . Medial meniscus, posterior horn derangement 01/31/2012  . OA (osteoarthritis) of knee 01/31/2012  . Acute exacerbation of chronic obstructive pulmonary disease (COPD) (HCC) 09/22/2011  . Tobacco abuse 09/22/2011  . Rotator cuff syndrome of left shoulder 01/05/2011  . Herniated disc, cervical 01/05/2011  . Barrett's esophagus without dysplasia 09/26/2010  . Constipation, chronic 09/26/2010  . Rectal bleeding 09/26/2010  . Esophageal dysphagia 06/13/2010  . ABDOMINAL PAIN-EPIGASTRIC 06/13/2010    Past Surgical History:  Procedure Laterality Date  . ABDOMINAL HYSTERECTOMY    . ANKLE ARTHROSCOPY WITH DRILLING/MICROFRACTURE  02/26/2012   Procedure: ANKLE ARTHROSCOPY WITH DRILLING/MICROFRACTURE;  Surgeon: Vickki Hearing, MD;  Location: [facility];  Service: Orthopedics;  Laterality: Right;  . COLONOSCOPY  11/20/2011   RMR: Friable anorectum; single anal canal hemorrhoidal tag. Otherwise normal rectum and colon  . ESOPHAGOGASTRODUODENOSCOPY  06/2010   diagnosed with Barrett's, small hh, esophagus dilated. Next EGD 06/2011  . ESOPHAGOGASTRODUODENOSCOPY  Aug 2013   RMR: abnormal distal esophagus consistent with prior dx of SS Barrett;s. s/p dilation. small hiatal hernia. Biopsies: no metaplasia, dysplasia, malignancy. SURVEILLANCE AUG 2016  . EXAM UNDER ANESTHESIA WITH MANIPULATION OF KNEE Right 09/16/2012   Procedure: EXAM UNDER ANESTHESIA WITH MANIPULATION OF RIGHT KNEE;  Surgeon: Vickki Hearing, MD;  Location: [facility];  Service: Orthopedics;  Laterality: Right;  .  FOOT SURGERY     x 2  . KNEE ARTHROSCOPY  02/26/2012   Procedure: ARTHROSCOPY KNEE;  Surgeon: Vickki Hearing, MD;  Location: [facility];  Service: Orthopedics;  Laterality: Right;  with three chondroplasties  . KNEE ARTHROTOMY Left 11/21/2013   Procedure: KNEE ARTHROSCOPY  WITH MEDIAL, LATERAL, AND PATELLA  FEMORAL CHONDROPLASTY;  Surgeon: Vickki Hearing, MD;  Location: [facility];  Service: Orthopedics;  Laterality: Left;  . s/p hysterectomy    . SINUS SURGERY WITH INSTATRAK    . SURGERY OF LIP     biopsy  . TOTAL KNEE ARTHROPLASTY Right 07/01/2012   Procedure: TOTAL KNEE ARTHROPLASTY;  Surgeon: Vickki Hearing, MD;  Location: [facility];  Service: Orthopedics;  Laterality: Right;  . TUBAL LIGATION      OB History    No data available       Home Medications    Prior to Admission medications   Medication Sig Start Date End Date Taking? Authorizing Provider  albuterol (PROVENTIL HFA;VENTOLIN HFA) 108 (90 BASE) MCG/ACT inhaler Inhale 2 puffs into the lungs every 6 (six) hours as needed. 12/30/13 12/30/14  Historical Provider, MD  albuterol (PROVENTIL) (2.5 MG/3ML) 0.083% nebulizer solution Take 2.5 mg by nebulization every 6 (six) hours as needed for shortness of breath.     Historical Provider, MD  ALPRAZolam Prudy Feeler) 1 MG tablet Take 1 mg by mouth 3 (three) times daily as needed for anxiety.     Historical Provider, MD  amitriptyline (ELAVIL) 25 MG tablet Take 25 mg by mouth at bedtime.      Historical Provider, MD  amLODipine (NORVASC) 10 MG tablet Take 10 mg by mouth daily. 08/31/14   Historical Provider, MD  cyclobenzaprine (FLEXERIL) 10 MG tablet Take 1 tablet (10 mg total) by mouth 3 (three) times daily as needed for muscle spasms. 05/27/14   Vickki Hearing, MD  dexlansoprazole (DEXILANT) 60 MG capsule Take 1 capsule (60 mg total) by mouth daily. 03/21/12   Gelene Mink, NP  dextromethorphan-guaiFENesin [facility] DM) 30-600 MG per 12 hr tablet Take 1 tablet by mouth every 12 (twelve) hours.      Historical Provider, MD  EPITOL 200 MG tablet Take 200 mg by mouth 2 (two) times daily. 08/31/14   Historical Provider, MD  ergocalciferol (VITAMIN D2) 50000 UNITS capsule Take 50,000 Units by mouth once a week. On fridays    Historical Provider, MD  hydrochlorothiazide (MICROZIDE) 12.5 MG capsule Take 1 capsule by mouth daily. 06/02/14    Historical Provider, MD  lamoTRIgine (LAMICTAL) 100 MG tablet Take 100 mg by mouth every morning.     Historical Provider, MD  Linaclotide 290 MCG CAPS Take 1 capsule by mouth daily. 30 minutes before a meal 03/21/12   Gelene Mink, NP  lisinopril (PRINIVIL,ZESTRIL) 10 MG tablet Take 20 mg by mouth every morning.  08/31/10   Historical Provider, MD  Mometasone Furo-Formoterol Fum (DULERA) 200-5 MCG/ACT AERO Inhale 2 puffs into the lungs 2 (two) times daily.     Historical Provider, MD  omeprazole (PRILOSEC) 40 MG capsule Take 40 mg by mouth 2 (two) times daily. 08/31/14   Historical Provider, MD  pantoprazole (PROTONIX) 40 MG tablet Take 1 tablet (40 mg total) by mouth 2 (two) times daily. 05/29/13   Gelene Mink, NP  QUEtiapine (SEROQUEL) 25 MG tablet Take 50 mg by mouth at bedtime.  11/04/13   Historical Provider, MD  senna-docusate (SENOKOT-S) 8.6-50 MG per tablet Take 1 tablet  by mouth at bedtime as needed. 07/04/12   Vickki Hearing, MD  SEROQUEL XR 50 MG TB24 Take 50 mg by mouth at bedtime.  08/28/10   Historical Provider, MD  tiZANidine (ZANAFLEX) 4 MG tablet Take 1 tablet (4 mg total) by mouth every 6 (six) hours as needed for muscle spasms. 08/16/15   Vickki Hearing, MD  tiZANidine (ZANAFLEX) 4 MG tablet TAKE ONE TABLET BY MOUTH EVERY 6 HOURS AS NEEDED FOR MUSCLE SPASMS 02/15/16   Vickki Hearing, MD  traMADol (ULTRAM) 50 MG tablet Take 1 tablet (50 mg total) by mouth every 8 (eight) hours as needed. 04/11/16   Erick Colace, MD  traZODone (DESYREL) 150 MG tablet Take 300 mg by mouth at bedtime.  08/25/10   Historical Provider, MD  Umeclidinium Bromide 62.5 MCG/INH AEPB Inhale 1 puff into the lungs daily. 08/25/14   Historical Provider, MD    Family History Family History  Problem Relation Age of Onset  . Heart disease Mother   . Diabetes Mother   . Asthma Mother   . Lung disease Mother   . Arthritis Mother   . Allergies Mother   . Heart attack Brother 48    deceased  . Colon  cancer Neg Hx     Social History Social History  Substance Use Topics  . Smoking status: Former Smoker    Packs/day: 2.00    Years: 34.00    Types: Cigarettes    Quit date: 12/10/2015  . Smokeless tobacco: Never Used     Comment: up to 3 ppd  . Alcohol use No     Comment: rarely     Allergies   Aspirin; Nitrofurantoin; and Relafen [nabumetone]   Review of Systems Review of Systems  All other systems reviewed and are negative.    Physical Exam Updated Vital Signs BP 157/86   Pulse 83   Temp 98.4 F (36.9 C) (Oral)   Resp 18   Ht 5\' 4"  (1.626 m)   Wt 102.1 kg   SpO2 98%   BMI 38.62 kg/m   Physical Exam Nursing note and vitals reviewed.  Constitutional: Pt appears well-developed and well-nourished. No distress.  HENT:  Head: Normocephalic and atraumatic.  Eyes: Conjunctivae are normal.  Neck: Normal range of motion.  Cardiovascular: Normal rate, regular rhythm. Intact distal pulses.   Capillary refill < 3 sec.  Pulmonary/Chest: Effort normal and breath sounds normal.  Musculoskeletal:  Left Knee Pt exhibits swelling and tenderness to palpation diffusely.   ROM: 4/5 limited by pain  Strength: 4/5 limited by pain  Neurological: Pt  is alert. Coordination normal.  Sensation: 5/5 Skin: Skin is warm and dry. Pt is not diaphoretic.  No evidence of open wound or skin tenting, no erythema or evidence of cellulitis Psychiatric: Pt has a normal mood and affect.     ED Treatments / Results  Labs (all labs ordered are listed, but only abnormal results are displayed) Labs Reviewed - No data to display  EKG  EKG Interpretation None       Radiology Dg Knee Complete 4 Views Left  Result Date: 06/08/2016 CLINICAL DATA:  Chronic left knee pain, swelling. EXAM: LEFT KNEE - COMPLETE 4+ VIEW COMPARISON:  05/29/2016 FINDINGS: Probable intra-articular loose body again noted, now projecting in the lateral compartment on the frontal view. Moderate joint effusion. No  acute fracture, subluxation or dislocation. Mild degenerative changes. IMPRESSION: Probable intra-articular loose body again noted. Moderate joint effusion. No acute bony abnormality.  Electronically Signed   By: Charlett Nose M.D.   On: 06/08/2016 19:05    Procedures Procedures (including critical care time)  Medications Ordered in ED Medications - No data to display   Initial Impression / Assessment and Plan / ED Course  I have reviewed the triage vital signs and the nursing notes.  Pertinent labs & imaging results that were available during my care of the patient were reviewed by me and considered in my medical decision making (see chart for details).     Patient X-Ray similar to priors.  Loose body is thought to be chronic per Dr. Mort Sawyers note from office visit.  Moderate effusion.  I discussed with the patient that we generally do not perform therapeutic aspirations in the ED and have recommended that she see her orthopedic for this. Conservative therapy recommended and discussed. Patient will be discharged home & is agreeable with above plan. Returns precautions discussed. Pt appears safe for discharge.   Final Clinical Impressions(s) / ED Diagnoses   Final diagnoses:  Chronic pain of left knee    New Prescriptions New Prescriptions   HYDROCODONE-ACETAMINOPHEN (NORCO/VICODIN) 5-325 MG TABLET    Take 1-2 tablets by mouth every 6 (six) hours as needed.     Roxy Horseman, PA-C 06/08/16 1958    Pricilla Loveless, MD 06/08/16 [phone]

## 2016-06-08 NOTE — ED Notes (Signed)
Report given to Yvette RN

## 2016-06-08 NOTE — ED Triage Notes (Signed)
Pt c/o fluid build up on her left knee that is causing pain. Pt reports she saw Dr. Romeo Apple last week and her knee had "popped out" due to fluid build up and he drew the fluid out and put pt in brace for the next 6 weeks. Pt reports her knee popped again today and she has similar symptoms as she did when she saw Dr. Romeo Apple last week. Pt called Dr. Romeo Apple but he is unable to see pt until next week.

## 2016-06-08 NOTE — Telephone Encounter (Signed)
Patient aware.

## 2016-06-09 ENCOUNTER — Telehealth: Payer: Self-pay | Admitting: Physical Medicine & Rehabilitation

## 2016-06-09 ENCOUNTER — Telehealth: Payer: Self-pay | Admitting: Orthopedic Surgery

## 2016-06-09 MED ORDER — TRAMADOL HCL 50 MG PO TABS: 50.0000 mg | ORAL_TABLET | Freq: Three times a day (TID) | ORAL | 0 refills | Status: DC | PRN

## 2016-06-09 NOTE — Telephone Encounter (Signed)
Called to pharmacy 

## 2016-06-09 NOTE — Telephone Encounter (Signed)
Patient needs a refill on Tramadol. 

## 2016-06-09 NOTE — Telephone Encounter (Signed)
Patient returned call. States she did go on to [facility] Emergency room last night; states "did not help".  States did have an Xray. Appointment has been scheduled for first available after Dr's return to office.  Patient aware.

## 2016-06-09 NOTE — Telephone Encounter (Signed)
Spoke with patient 06/08/16; patient called to relay that her knee was "filling up with fluid again" and she requested to see Dr Romeo Apple.  Explained Dr was in surgery and then scheduled to be out of office through 06/19/16.  She asked what else can she do?  I mentioned Emergency room if needed, and relayed I would also speak with our nurse. Per Marijean Niemann, LPN, relays for patient to see primary care or Emergency room and to schedule next available with Dr Romeo Apple. Called back to patient to offer appointment; reached voice mail.  Left message to return call.

## 2016-06-11 ENCOUNTER — Encounter (HOSPITAL_COMMUNITY): Payer: Self-pay | Admitting: Emergency Medicine

## 2016-06-11 ENCOUNTER — Emergency Department (HOSPITAL_COMMUNITY): Payer: Medicare Other

## 2016-06-11 ENCOUNTER — Emergency Department (HOSPITAL_COMMUNITY)
Admission: EM | Admit: 2016-06-11 | Discharge: 2016-06-11 | Disposition: A | Discharge status: Home / Self Care | Payer: Medicare Other | Attending: Emergency Medicine | Admitting: Emergency Medicine

## 2016-06-11 DIAGNOSIS — I10 Essential (primary) hypertension: Secondary | ICD-10-CM | POA: Diagnosis not present

## 2016-06-11 DIAGNOSIS — Z79899 Other long term (current) drug therapy: Secondary | ICD-10-CM | POA: Diagnosis not present

## 2016-06-11 DIAGNOSIS — J449 Chronic obstructive pulmonary disease, unspecified: Secondary | ICD-10-CM | POA: Insufficient documentation

## 2016-06-11 DIAGNOSIS — Z5321 Procedure and treatment not carried out due to patient leaving prior to being seen by health care provider: Secondary | ICD-10-CM | POA: Diagnosis not present

## 2016-06-11 DIAGNOSIS — M25562 Pain in left knee: Secondary | ICD-10-CM | POA: Diagnosis present

## 2016-06-11 DIAGNOSIS — Z96651 Presence of right artificial knee joint: Secondary | ICD-10-CM | POA: Insufficient documentation

## 2016-06-11 DIAGNOSIS — R609 Edema, unspecified: Secondary | ICD-10-CM

## 2016-06-11 DIAGNOSIS — M25462 Effusion, left knee: Secondary | ICD-10-CM | POA: Diagnosis not present

## 2016-06-11 DIAGNOSIS — Z87891 Personal history of nicotine dependence: Secondary | ICD-10-CM | POA: Insufficient documentation

## 2016-06-11 NOTE — ED Triage Notes (Signed)
C/o left knee pain started approx a week ago-- hx of "fluid build up on knee" -- seen at [facility]-- d/c'd without "anything being done" -- has right knee replaced.

## 2016-06-11 NOTE — ED Notes (Signed)
No answer when called for a room. 

## 2016-06-11 NOTE — ED Notes (Signed)
Pt. Stated, Im going to see my doctor tomorrow.

## 2016-06-13 ENCOUNTER — Encounter: Payer: Medicare Other | Attending: Physical Medicine & Rehabilitation

## 2016-06-13 ENCOUNTER — Ambulatory Visit: Payer: Medicare Other | Admitting: Physical Medicine & Rehabilitation

## 2016-06-26 ENCOUNTER — Ambulatory Visit: Payer: Medicare Other | Admitting: Orthopedic Surgery

## 2016-06-27 ENCOUNTER — Encounter: Payer: Self-pay | Admitting: Orthopedic Surgery

## 2016-07-11 ENCOUNTER — Encounter: Payer: Medicare Other | Admitting: Orthopedic Surgery

## 2016-07-11 ENCOUNTER — Ambulatory Visit: Payer: Medicare Other

## 2016-07-12 ENCOUNTER — Other Ambulatory Visit: Payer: Self-pay | Admitting: *Deleted

## 2016-07-12 ENCOUNTER — Encounter: Payer: Self-pay | Admitting: Orthopedic Surgery

## 2016-07-12 MED ORDER — TRAMADOL HCL 50 MG PO TABS: 50.0000 mg | ORAL_TABLET | Freq: Three times a day (TID) | ORAL | 0 refills | Status: DC | PRN

## 2016-07-31 ENCOUNTER — Other Ambulatory Visit: Payer: Self-pay | Admitting: Orthopedic Surgery

## 2016-08-11 ENCOUNTER — Other Ambulatory Visit: Payer: Self-pay

## 2016-08-15 ENCOUNTER — Telehealth: Payer: Self-pay | Admitting: *Deleted

## 2016-08-15 ENCOUNTER — Other Ambulatory Visit: Payer: Self-pay

## 2016-08-15 NOTE — Telephone Encounter (Signed)
Patient called and left a message stating that she has called [place] multiple times about getting her tramadol refilled.  I contacted the patient back and left a message informing that she needs to make an appointment to be seen in order to receive any more scripts

## 2016-08-17 ENCOUNTER — Ambulatory Visit: Payer: Medicare Other | Admitting: Physical Medicine & Rehabilitation

## 2016-08-21 ENCOUNTER — Encounter: Payer: Medicare Other | Attending: Physical Medicine & Rehabilitation

## 2016-08-21 ENCOUNTER — Encounter: Payer: Self-pay | Admitting: Physical Medicine & Rehabilitation

## 2016-08-21 ENCOUNTER — Ambulatory Visit (HOSPITAL_BASED_OUTPATIENT_CLINIC_OR_DEPARTMENT_OTHER): Payer: Medicare Other | Admitting: Physical Medicine & Rehabilitation

## 2016-08-21 VITALS — BP 132/87 | HR 107 | Resp 14

## 2016-08-21 DIAGNOSIS — M25561 Pain in right knee: Secondary | ICD-10-CM | POA: Diagnosis not present

## 2016-08-21 DIAGNOSIS — M4696 Unspecified inflammatory spondylopathy, lumbar region: Secondary | ICD-10-CM | POA: Diagnosis not present

## 2016-08-21 DIAGNOSIS — M47896 Other spondylosis, lumbar region: Secondary | ICD-10-CM | POA: Diagnosis not present

## 2016-08-21 DIAGNOSIS — M25562 Pain in left knee: Secondary | ICD-10-CM | POA: Insufficient documentation

## 2016-08-21 DIAGNOSIS — Z96651 Presence of right artificial knee joint: Secondary | ICD-10-CM

## 2016-08-21 DIAGNOSIS — M47816 Spondylosis without myelopathy or radiculopathy, lumbar region: Secondary | ICD-10-CM

## 2016-08-21 MED ORDER — TRAMADOL HCL 50 MG PO TABS: 50.0000 mg | ORAL_TABLET | Freq: Three times a day (TID) | ORAL | 5 refills | Status: DC | PRN

## 2016-08-21 NOTE — Patient Instructions (Signed)
If you get transportation would recommend another lumbar medial branch block

## 2016-08-21 NOTE — Progress Notes (Signed)
Subjective:    Patient ID: Katelyn Thompson, female    DOB: 1965/05/10, 51 y.o.   MRN: [id number]  HPI Pt relates missing several appts due to weather or illness. MBB helped reduce from 10 to 4 last "a couple weeks" Transportation an issue, she does drive but if she needs a driver. She has difficulty finding one   Bilateral knee pain Voltaren gel "too expensive" Uses bengay which helps a little Now sees Dr. Dion Saucier and has an appointment next week  Pain Inventory Average Pain 10 Pain Right Now 8 My pain is sharp, dull, tingling and aching  In the last 24 hours, has pain interfered with the following? General activity 7 Relation with others 8 Enjoyment of life 10 What TIME of day is your pain at its worst? all Sleep (in general) Poor  Pain is worse with: walking, bending, sitting and standing Pain improves with: medication Relief from Meds: 5  Mobility walk with assistance use a cane how many minutes can you walk? 10 ability to climb steps?  yes do you drive?  yes  Function disabled: date disabled . I need assistance with the following:  meal prep, household duties and shopping  Neuro/Psych numbness tingling trouble walking spasms dizziness confusion depression anxiety  Prior Studies Any changes since last visit?  no  Physicians involved in your care Any changes since last visit?  no   Family History  Problem Relation Age of Onset  . Heart disease Mother   . Diabetes Mother   . Asthma Mother   . Lung disease Mother   . Arthritis Mother   . Allergies Mother   . Heart attack Brother 48       deceased  . Colon cancer Neg Hx    Social History   Social History  . Marital status: Married    Spouse name: N/A  . Number of children: N/A  . Years of education: 9th grade   Occupational History  . disabled Not Employed   Social History Main Topics  . Smoking status: Former Smoker    Packs/day: 2.00    Years: 34.00    Types: Cigarettes    Quit  date: 12/10/2015  . Smokeless tobacco: Never Used     Comment: up to 3 ppd  . Alcohol use No     Comment: rarely  . Drug use: No  . Sexual activity: Yes    Birth control/ protection: Surgical   Other Topics Concern  . None   Social History Narrative  . None   Past Surgical History:  Procedure Laterality Date  . ABDOMINAL HYSTERECTOMY    . ANKLE ARTHROSCOPY WITH DRILLING/MICROFRACTURE  02/26/2012   Procedure: ANKLE ARTHROSCOPY WITH DRILLING/MICROFRACTURE;  Surgeon: Vickki Hearing, MD;  Location: [facility];  Service: Orthopedics;  Laterality: Right;  . COLONOSCOPY  11/20/2011   RMR: Friable anorectum; single anal canal hemorrhoidal tag. Otherwise normal rectum and colon  . ESOPHAGOGASTRODUODENOSCOPY  06/2010   diagnosed with Barrett's, small hh, esophagus dilated. Next EGD 06/2011  . ESOPHAGOGASTRODUODENOSCOPY  Aug 2013   RMR: abnormal distal esophagus consistent with prior dx of SS Barrett;s. s/p dilation. small hiatal hernia. Biopsies: no metaplasia, dysplasia, malignancy. SURVEILLANCE AUG 2016  . EXAM UNDER ANESTHESIA WITH MANIPULATION OF KNEE Right 09/16/2012   Procedure: EXAM UNDER ANESTHESIA WITH MANIPULATION OF RIGHT KNEE;  Surgeon: Vickki Hearing, MD;  Location: [facility];  Service: Orthopedics;  Laterality: Right;  . FOOT SURGERY     x 2  .  KNEE ARTHROSCOPY  02/26/2012   Procedure: ARTHROSCOPY KNEE;  Surgeon: Vickki Hearing, MD;  Location: [facility];  Service: Orthopedics;  Laterality: Right;  with three chondroplasties  . KNEE ARTHROTOMY Left 11/21/2013   Procedure: KNEE ARTHROSCOPY  WITH MEDIAL, LATERAL, AND PATELLA  FEMORAL CHONDROPLASTY;  Surgeon: Vickki Hearing, MD;  Location: [facility];  Service: Orthopedics;  Laterality: Left;  . s/p hysterectomy    . SINUS SURGERY WITH INSTATRAK    . SURGERY OF LIP     biopsy  . TOTAL KNEE ARTHROPLASTY Right 07/01/2012   Procedure: TOTAL KNEE ARTHROPLASTY;  Surgeon: Vickki Hearing, MD;  Location: [facility];  Service: Orthopedics;   Laterality: Right;  . TUBAL LIGATION     Past Medical History:  Diagnosis Date  . Anxiety   . Anxiety and depression    Does not like being around lots of people  . Arthritis   . Barrett's esophagus without dysplasia   . Bipolar 1 disorder (HCC)   . COPD (chronic obstructive pulmonary disease) (HCC)   . Depression   . GERD (gastroesophageal reflux disease)   . HTN (hypertension)   . Hypercholesteremia   . MS (multiple sclerosis) (HCC)    but not offcially disgnosed  . Nerve damage    Severe to left foot  . OA (osteoarthritis) of knee 01/31/2012  . PONV (postoperative nausea and vomiting)    BP 132/87   Pulse (!) 107   Resp 14   SpO2 94%   Opioid Risk Score:   Fall Risk Score:  `1  Depression screen PHQ 2/9  Depression screen PHQ 2/9 02/11/2016  Decreased Interest 3  Down, Depressed, Hopeless 3  PHQ - 2 Score 6  Altered sleeping 2  Tired, decreased energy 2  Change in appetite 2  Feeling bad or failure about yourself  3  Trouble concentrating 2  Moving slowly or fidgety/restless 2  Suicidal thoughts 1  PHQ-9 Score 20    Review of Systems  Constitutional: Positive for diaphoresis and unexpected weight change.  HENT: Negative.   Eyes: Negative.   Respiratory: Positive for choking, shortness of breath and wheezing.   Cardiovascular: Negative.   Gastrointestinal: Positive for abdominal pain, constipation and nausea.  Endocrine: Negative.   Genitourinary: Positive for difficulty urinating.  Musculoskeletal: Positive for arthralgias, back pain, gait problem and myalgias.       Spasms  Skin: Negative.   Allergic/Immunologic: Negative.   Neurological: Positive for dizziness and numbness.       Tingling  Hematological: Negative.   Psychiatric/Behavioral: Positive for confusion and dysphoric mood. The patient is nervous/anxious.        Objective:   Physical Exam  Constitutional: She is oriented to person, place, and time. She appears well-developed and  well-nourished.  HENT:  Head: Normocephalic and atraumatic.  Eyes: Conjunctivae and EOM are normal. Pupils are equal, round, and reactive to light.  Neck: Normal range of motion.  Neurological: She is alert and oriented to person, place, and time.  Psychiatric: She has a normal mood and affect.  Nursing note and vitals reviewed.     Lumbar flexion 75%, extension 25%, lateral bending 25%. Pain is greater with extension and lateral bending Right knee with contracture at 45 of flexion. Has full extension. Left knee has full flexion and extension, no evidence of effusion or erythema.     Assessment & Plan:  1. Lumbar spondylosis. She had 60% relief with lumbar medial branch blocks L3-L4 as well as L5  dorsal ramus injection performed in November 2017. We discussed that next step would be to repeat this and if she has a similar response, would recommend radiofrequency neurotomy. She states that transportation is her main issue and if she can find somebody to take her, she will schedule. Also, would recommend Valium 10 mg prior to injection. 2. Bilateral knee pain, status post right total knee replacement with chronic contracture limiting flexion to 45. The left knee has osteoarthritis that has not had surgery. She will follow-up with orthopedics on this

## 2016-08-30 ENCOUNTER — Other Ambulatory Visit (HOSPITAL_COMMUNITY): Payer: Self-pay | Admitting: Orthopedic Surgery

## 2016-08-30 DIAGNOSIS — T84032D Mechanical loosening of internal right knee prosthetic joint, subsequent encounter: Secondary | ICD-10-CM

## 2016-09-07 ENCOUNTER — Encounter (HOSPITAL_COMMUNITY): Payer: Medicare Other

## 2016-10-04 ENCOUNTER — Encounter (HOSPITAL_COMMUNITY)
Admission: RE | Admit: 2016-10-04 | Discharge: 2016-10-04 | Disposition: A | Discharge status: Home / Self Care | Payer: Medicare Other | Source: Ambulatory Visit | Attending: Orthopedic Surgery | Admitting: Orthopedic Surgery

## 2016-10-04 DIAGNOSIS — T84032D Mechanical loosening of internal right knee prosthetic joint, subsequent encounter: Secondary | ICD-10-CM | POA: Diagnosis not present

## 2016-10-04 MED ORDER — TECHNETIUM TC 99M MEDRONATE IV KIT
21.0000 | PACK | Freq: Once | INTRAVENOUS | Status: AC | PRN
  Administered 2016-10-04: 21 via INTRAVENOUS

## 2017-02-19 ENCOUNTER — Ambulatory Visit: Payer: Medicare Other | Admitting: Physical Medicine & Rehabilitation

## 2017-02-19 ENCOUNTER — Encounter: Payer: Medicare Other | Attending: Physical Medicine & Rehabilitation

## 2017-02-19 ENCOUNTER — Encounter: Payer: Self-pay | Admitting: Physical Medicine & Rehabilitation

## 2017-02-19 VITALS — BP 123/81 | HR 76

## 2017-02-19 DIAGNOSIS — M47816 Spondylosis without myelopathy or radiculopathy, lumbar region: Secondary | ICD-10-CM | POA: Insufficient documentation

## 2017-02-19 DIAGNOSIS — Z90710 Acquired absence of both cervix and uterus: Secondary | ICD-10-CM | POA: Insufficient documentation

## 2017-02-19 DIAGNOSIS — Z9889 Other specified postprocedural states: Secondary | ICD-10-CM | POA: Diagnosis not present

## 2017-02-19 DIAGNOSIS — M25462 Effusion, left knee: Secondary | ICD-10-CM

## 2017-02-19 DIAGNOSIS — Z9851 Tubal ligation status: Secondary | ICD-10-CM | POA: Insufficient documentation

## 2017-02-19 DIAGNOSIS — Z87891 Personal history of nicotine dependence: Secondary | ICD-10-CM | POA: Insufficient documentation

## 2017-02-19 DIAGNOSIS — Z8249 Family history of ischemic heart disease and other diseases of the circulatory system: Secondary | ICD-10-CM | POA: Insufficient documentation

## 2017-02-19 DIAGNOSIS — Z833 Family history of diabetes mellitus: Secondary | ICD-10-CM | POA: Insufficient documentation

## 2017-02-19 DIAGNOSIS — Z801 Family history of malignant neoplasm of trachea, bronchus and lung: Secondary | ICD-10-CM | POA: Insufficient documentation

## 2017-02-19 DIAGNOSIS — M25562 Pain in left knee: Secondary | ICD-10-CM | POA: Insufficient documentation

## 2017-02-19 MED ORDER — TRAMADOL HCL 50 MG PO TABS: 50.0000 mg | ORAL_TABLET | Freq: Three times a day (TID) | ORAL | 5 refills | Status: DC | PRN

## 2017-02-19 MED ORDER — TIZANIDINE HCL 4 MG PO TABS: ORAL_TABLET | ORAL | 5 refills | Status: DC

## 2017-02-19 NOTE — Progress Notes (Signed)
Subjective:    Patient ID: Katelyn Thompson, female    DOB: Aug 12, 1965, 51 y.o.   MRN: [id number]  HPI   51 year old female with past medical history significant for right total knee arthroplasty whose primary complaint today is low back pain and left knee swelling. She has had medial branch blocks at bilateral L3-L4 and bilateral L5 dorsal ramus injections performed 02/28/2016.  There was a 60% pain relief that persisted for about 2 weeks.  Last MD visit was in May 2018, at that time she did not have the finances to repeat the injection. Today she is willing to undergo repeat medial branch blocks and dorsal ramus injection however she is concerned that she may not be able to find a driver to bring her unless it is a later afternoon appointment  Her knee pain is chronic.  She has previously been diagnosed with posterior horn of the medial meniscus tear with knee effusion.  She sees Dr. Turner Daniels over at [facility] orthopedics and plans to see him in follow-up soon. Pain Inventory Average Pain 8 Pain Right Now 8 My pain is sharp, dull and aching  In the last 24 hours, has pain interfered with the following? General activity 8 Relation with others 8 Enjoyment of life 8 What TIME of day is your pain at its worst? evening Sleep (in general) Poor  Pain is worse with: walking, bending and standing Pain improves with: rest and medication Relief from Meds: 4  Mobility use a cane how many minutes can you walk? 20 ability to climb steps?  yes do you drive?  yes  Function disabled: date disabled . I need assistance with the following:  household duties and shopping  Neuro/Psych bladder control problems weakness tingling trouble walking spasms dizziness confusion depression anxiety  Prior Studies Any changes since last visit?  no  Physicians involved in your care Any changes since last visit?  no   Family History  Problem Relation Age of Onset  . Heart disease Mother   .  Diabetes Mother   . Asthma Mother   . Lung disease Mother   . Arthritis Mother   . Allergies Mother   . Heart attack Brother 48       deceased  . Colon cancer Neg Hx    Social History   Socioeconomic History  . Marital status: Married    Spouse name: Not on file  . Number of children: Not on file  . Years of education: 9th grade  . Highest education level: Not on file  Social Needs  . Financial resource strain: Not on file  . Food insecurity - worry: Not on file  . Food insecurity - inability: Not on file  . Transportation needs - medical: Not on file  . Transportation needs - non-medical: Not on file  Occupational History  . Occupation: disabled    Associate Professor: NOT EMPLOYED  Tobacco Use  . Smoking status: Former Smoker    Packs/day: 2.00    Years: 34.00    Pack years: 68.00    Types: Cigarettes    Last attempt to quit: 12/10/2015    Years since quitting: 1.1  . Smokeless tobacco: Never Used  . Tobacco comment: up to 3 ppd  Substance and Sexual Activity  . Alcohol use: No    Comment: rarely  . Drug use: No  . Sexual activity: Yes    Birth control/protection: Surgical  Other Topics Concern  . Not on file  Social History Narrative  .  Not on file   Past Surgical History:  Procedure Laterality Date  . ABDOMINAL HYSTERECTOMY    . ESOPHAGOGASTRODUODENOSCOPY  06/2010   diagnosed with Barrett's, small hh, esophagus dilated. Next EGD 06/2011  . ESOPHAGOGASTRODUODENOSCOPY  Aug 2013   RMR: abnormal distal esophagus consistent with prior dx of SS Barrett;s. s/p dilation. small hiatal hernia. Biopsies: no metaplasia, dysplasia, malignancy. SURVEILLANCE AUG 2016  . FOOT SURGERY     x 2  . s/p hysterectomy    . SINUS SURGERY WITH INSTATRAK    . SURGERY OF LIP     biopsy  . TUBAL LIGATION     Past Medical History:  Diagnosis Date  . Anxiety   . Anxiety and depression    Does not like being around lots of people  . Arthritis   . Barrett's esophagus without dysplasia   .  Bipolar 1 disorder (HCC)   . COPD (chronic obstructive pulmonary disease) (HCC)   . Depression   . GERD (gastroesophageal reflux disease)   . HTN (hypertension)   . Hypercholesteremia   . MS (multiple sclerosis) (HCC)    but not offcially disgnosed  . Nerve damage    Severe to left foot  . OA (osteoarthritis) of knee 01/31/2012  . PONV (postoperative nausea and vomiting)    There were no vitals taken for this visit.  Opioid Risk Score:   Fall Risk Score:  `1  Depression screen PHQ 2/9  [facility] 2/[facility] 02/19/2017 02/11/2016  Decreased Interest 1 3  Down, Depressed, Hopeless 1 3  PHQ - 2 Score 2 6  Altered sleeping - 2  Tired, decreased energy - 2  Change in appetite - 2  Feeling bad or failure about yourself  - 3  Trouble concentrating - 2  Moving slowly or fidgety/restless - 2  Suicidal thoughts - 1  PHQ-9 Score - 20     Review of Systems  Constitutional:       Night sweats, high blood sugar  Gastrointestinal: Positive for abdominal pain and constipation.       Objective:   Physical Exam  Constitutional: She is oriented to person, place, and time. She appears well-developed and well-nourished.  HENT:  Head: Normocephalic and atraumatic.  Eyes: Conjunctivae and EOM are normal. Pupils are equal, round, and reactive to light.  Neck: Normal range of motion.  Musculoskeletal:       Left knee: She exhibits effusion. She exhibits normal range of motion and no deformity. Tenderness found. Medial joint line tenderness noted.  Neurological: She is alert and oriented to person, place, and time.  Skin: Skin is warm and dry.  Psychiatric: She has a normal mood and affect. Her behavior is normal. Judgment and thought content normal.  Nursing note and vitals reviewed. Her lumbar spine has minimal tenderness to palpation in the L3-4-5 S1 paraspinal area Patient has good lumbar flexion however extension is limited to 25% of normal, lateral bending is 25% of normal as  well accompanied by pain. Negative straight leg raising Gait without evidence of toe drag or knee instability.        Assessment & Plan:  1.  Lumbar spondylosis without myelopathy recommendations are to repeat bilateral L3-L4 medial branch and L5 dorsal ramus injections under fluoroscopic guidance.  If she has 50% or greater relief at least on a temporary basis, would recommend proceeding with radiofrequency neurotomy at corresponding levels first on the right side and then 1 month later on the left side. Patient will  make an appointment, she is concerned about transportation.  Will refill tramadol 50 mg every 8 hours as needed as well as tizanidine 4 mg p.o. twice daily as needed  2.  Left knee pain with knee effusion.  Have instructed patient to contact orthopedics.  She does have a history of posterior horn of the medial meniscus tear.  May require arthroscopic surgery.

## 2017-02-19 NOTE — Patient Instructions (Signed)
Please call if you cannot find a driver to cancel procedure

## 2017-03-19 ENCOUNTER — Encounter: Payer: Medicare Other | Attending: Physical Medicine & Rehabilitation

## 2017-03-19 ENCOUNTER — Ambulatory Visit: Payer: Medicare Other | Admitting: Physical Medicine & Rehabilitation

## 2017-03-19 DIAGNOSIS — M47816 Spondylosis without myelopathy or radiculopathy, lumbar region: Secondary | ICD-10-CM | POA: Insufficient documentation

## 2017-03-19 DIAGNOSIS — Z8249 Family history of ischemic heart disease and other diseases of the circulatory system: Secondary | ICD-10-CM | POA: Insufficient documentation

## 2017-03-19 DIAGNOSIS — Z833 Family history of diabetes mellitus: Secondary | ICD-10-CM | POA: Insufficient documentation

## 2017-03-19 DIAGNOSIS — Z87891 Personal history of nicotine dependence: Secondary | ICD-10-CM | POA: Insufficient documentation

## 2017-03-19 DIAGNOSIS — Z801 Family history of malignant neoplasm of trachea, bronchus and lung: Secondary | ICD-10-CM | POA: Insufficient documentation

## 2017-03-19 DIAGNOSIS — Z9889 Other specified postprocedural states: Secondary | ICD-10-CM | POA: Insufficient documentation

## 2017-03-19 DIAGNOSIS — Z9851 Tubal ligation status: Secondary | ICD-10-CM | POA: Insufficient documentation

## 2017-03-19 DIAGNOSIS — Z90710 Acquired absence of both cervix and uterus: Secondary | ICD-10-CM | POA: Insufficient documentation

## 2017-03-19 DIAGNOSIS — M25562 Pain in left knee: Secondary | ICD-10-CM | POA: Insufficient documentation

## 2017-07-20 ENCOUNTER — Ambulatory Visit: Payer: Medicare Other | Admitting: Physical Medicine & Rehabilitation

## 2017-07-20 ENCOUNTER — Encounter: Payer: Self-pay | Admitting: Physical Medicine & Rehabilitation

## 2017-07-20 ENCOUNTER — Encounter: Payer: Medicare Other | Attending: Physical Medicine & Rehabilitation

## 2017-07-20 ENCOUNTER — Ambulatory Visit
Admission: RE | Admit: 2017-07-20 | Discharge: 2017-07-20 | Disposition: A | Discharge status: Home / Self Care | Payer: Medicare Other | Source: Ambulatory Visit | Attending: Physical Medicine & Rehabilitation | Admitting: Physical Medicine & Rehabilitation

## 2017-07-20 VITALS — BP 118/84 | HR 88 | Resp 14 | Ht 65.0 in | Wt 231.0 lb

## 2017-07-20 DIAGNOSIS — M47816 Spondylosis without myelopathy or radiculopathy, lumbar region: Secondary | ICD-10-CM | POA: Insufficient documentation

## 2017-07-20 DIAGNOSIS — G894 Chronic pain syndrome: Secondary | ICD-10-CM | POA: Diagnosis not present

## 2017-07-20 DIAGNOSIS — K2270 Barrett's esophagus without dysplasia: Secondary | ICD-10-CM | POA: Diagnosis not present

## 2017-07-20 DIAGNOSIS — F319 Bipolar disorder, unspecified: Secondary | ICD-10-CM | POA: Diagnosis not present

## 2017-07-20 DIAGNOSIS — E7800 Pure hypercholesterolemia, unspecified: Secondary | ICD-10-CM | POA: Diagnosis not present

## 2017-07-20 DIAGNOSIS — K219 Gastro-esophageal reflux disease without esophagitis: Secondary | ICD-10-CM | POA: Insufficient documentation

## 2017-07-20 DIAGNOSIS — J449 Chronic obstructive pulmonary disease, unspecified: Secondary | ICD-10-CM | POA: Insufficient documentation

## 2017-07-20 DIAGNOSIS — Z87891 Personal history of nicotine dependence: Secondary | ICD-10-CM | POA: Insufficient documentation

## 2017-07-20 DIAGNOSIS — I10 Essential (primary) hypertension: Secondary | ICD-10-CM | POA: Diagnosis not present

## 2017-07-20 DIAGNOSIS — M25562 Pain in left knee: Secondary | ICD-10-CM

## 2017-07-20 DIAGNOSIS — F419 Anxiety disorder, unspecified: Secondary | ICD-10-CM | POA: Diagnosis not present

## 2017-07-20 MED ORDER — TIZANIDINE HCL 4 MG PO TABS: ORAL_TABLET | ORAL | 5 refills | Status: DC

## 2017-07-20 NOTE — Patient Instructions (Signed)
[facility]  [address] , [place] [address]

## 2017-07-20 NOTE — Progress Notes (Signed)
Subjective:    Patient ID: Katelyn Thompson, female    DOB: 09/01/1965, 52 y.o.   MRN: [id number]  HPI 52 year old female with history of lumbar spondylosis chronic low back pain with good response to a lumbar medial branch block November 2017.  Pain went down by greater than 50% for a couple weeks.  Because of transportation issues she has not rescheduled an appointment. More recently she has had increasing left knee pain.  She does have a history of fall about 2 months ago but never sought medical care.  She has had no swelling in the knee no persistent bruising.  She has a history of a right total knee replacement with chronic postoperative contracture. Patient has been on chronic low-dose tramadol for pain.  She has elevated opioid risk  Pain Inventory Average Pain 7 Pain Right Now 8 My pain is sharp, stabbing, tingling and aching  In the last 24 hours, has pain interfered with the following? General activity 7 Relation with others 5 Enjoyment of life 7 What TIME of day is your pain at its worst? all Sleep (in general) Poor  Pain is worse with: walking, bending, sitting and standing Pain improves with: rest, medication and injections Relief from Meds: 2  Mobility walk without assistance how many minutes can you walk? 30 ability to climb steps?  yes do you drive?  yes  Function disabled: date disabled . I need assistance with the following:  household duties and shopping  Neuro/Psych bladder control problems numbness tingling trouble walking dizziness depression anxiety  Prior Studies Any changes since last visit?  no  Physicians involved in your care Any changes since last visit?  no   Family History  Problem Relation Age of Onset  . Heart disease Mother   . Diabetes Mother   . Asthma Mother   . Lung disease Mother   . Arthritis Mother   . Allergies Mother   . Heart attack Brother 48       deceased  . Colon cancer Neg Hx    Social History    Socioeconomic History  . Marital status: Married    Spouse name: Not on file  . Number of children: Not on file  . Years of education: 9th grade  . Highest education level: Not on file  Occupational History  . Occupation: disabled    Associate Professor: NOT EMPLOYED  Social Needs  . Financial resource strain: Not on file  . Food insecurity:    Worry: Not on file    Inability: Not on file  . Transportation needs:    Medical: Not on file    Non-medical: Not on file  Tobacco Use  . Smoking status: Former Smoker    Packs/day: 2.00    Years: 34.00    Pack years: 68.00    Types: Cigarettes    Last attempt to quit: 12/10/2015    Years since quitting: 1.6  . Smokeless tobacco: Never Used  . Tobacco comment: up to 3 ppd  Substance and Sexual Activity  . Alcohol use: No    Comment: rarely  . Drug use: No  . Sexual activity: Yes    Birth control/protection: Surgical  Lifestyle  . Physical activity:    Days per week: Not on file    Minutes per session: Not on file  . Stress: Not on file  Relationships  . Social connections:    Talks on phone: Not on file    Gets together: Not on file  Attends religious service: Not on file    Active member of club or organization: Not on file    Attends meetings of clubs or organizations: Not on file    Relationship status: Not on file  Other Topics Concern  . Not on file  Social History Narrative  . Not on file   Past Surgical History:  Procedure Laterality Date  . ABDOMINAL HYSTERECTOMY    . ANKLE ARTHROSCOPY WITH DRILLING/MICROFRACTURE  02/26/2012   Procedure: ANKLE ARTHROSCOPY WITH DRILLING/MICROFRACTURE;  Surgeon: Vickki Hearing, MD;  Location: [facility];  Service: Orthopedics;  Laterality: Right;  . COLONOSCOPY  11/20/2011   RMR: Friable anorectum; single anal canal hemorrhoidal tag. Otherwise normal rectum and colon  . ESOPHAGOGASTRODUODENOSCOPY  06/2010   diagnosed with Barrett's, small hh, esophagus dilated. Next EGD 06/2011  .  ESOPHAGOGASTRODUODENOSCOPY  Aug 2013   RMR: abnormal distal esophagus consistent with prior dx of SS Barrett;s. s/p dilation. small hiatal hernia. Biopsies: no metaplasia, dysplasia, malignancy. SURVEILLANCE AUG 2016  . EXAM UNDER ANESTHESIA WITH MANIPULATION OF KNEE Right 09/16/2012   Procedure: EXAM UNDER ANESTHESIA WITH MANIPULATION OF RIGHT KNEE;  Surgeon: Vickki Hearing, MD;  Location: [facility];  Service: Orthopedics;  Laterality: Right;  . FOOT SURGERY     x 2  . KNEE ARTHROSCOPY  02/26/2012   Procedure: ARTHROSCOPY KNEE;  Surgeon: Vickki Hearing, MD;  Location: [facility];  Service: Orthopedics;  Laterality: Right;  with three chondroplasties  . KNEE ARTHROTOMY Left 11/21/2013   Procedure: KNEE ARTHROSCOPY  WITH MEDIAL, LATERAL, AND PATELLA  FEMORAL CHONDROPLASTY;  Surgeon: Vickki Hearing, MD;  Location: [facility];  Service: Orthopedics;  Laterality: Left;  . s/p hysterectomy    . SINUS SURGERY WITH INSTATRAK    . SURGERY OF LIP     biopsy  . TOTAL KNEE ARTHROPLASTY Right 07/01/2012   Procedure: TOTAL KNEE ARTHROPLASTY;  Surgeon: Vickki Hearing, MD;  Location: [facility];  Service: Orthopedics;  Laterality: Right;  . TUBAL LIGATION     Past Medical History:  Diagnosis Date  . Anxiety   . Anxiety and depression    Does not like being around lots of people  . Arthritis   . Barrett's esophagus without dysplasia   . Bipolar 1 disorder (HCC)   . COPD (chronic obstructive pulmonary disease) (HCC)   . Depression   . GERD (gastroesophageal reflux disease)   . HTN (hypertension)   . Hypercholesteremia   . MS (multiple sclerosis) (HCC)    but not offcially disgnosed  . Nerve damage    Severe to left foot  . OA (osteoarthritis) of knee 01/31/2012  . PONV (postoperative nausea and vomiting)    BP 118/84   Pulse 88   Resp 14   Ht 5\' 5"  (1.651 m)   Wt 231 lb (104.8 kg)   SpO2 93%   BMI 38.44 kg/m   Opioid Risk Score:   Fall Risk Score:  `1  Depression screen PHQ  2/9  Depression screen [facility] 2/9 02/19/2017 02/11/2016  Decreased Interest 1 3  Down, Depressed, Hopeless 1 3  PHQ - 2 Score 2 6  Altered sleeping - 2  Tired, decreased energy - 2  Change in appetite - 2  Feeling bad or failure about yourself  - 3  Trouble concentrating - 2  Moving slowly or fidgety/restless - 2  Suicidal thoughts - 1  PHQ-9 Score - 20    Review of Systems  Constitutional: Positive for diaphoresis.  HENT:  Negative.   Respiratory: Positive for shortness of breath.   Cardiovascular: Positive for leg swelling.  Gastrointestinal: Positive for abdominal pain and constipation.  Genitourinary: Negative.   Musculoskeletal: Positive for arthralgias, back pain and gait problem.  Skin: Negative.   Neurological: Positive for numbness.       Tingling  Psychiatric/Behavioral: Positive for dysphoric mood. The patient is nervous/anxious.   All other systems reviewed and are negative.      Objective:   Physical Exam  Constitutional: She is oriented to person, place, and time. She appears well-developed and well-nourished. No distress.  HENT:  Head: Normocephalic and atraumatic.  Eyes: Pupils are equal, round, and reactive to light. Conjunctivae and EOM are normal.  Neck: Normal range of motion.  Musculoskeletal: Normal range of motion.       Left knee: She exhibits normal range of motion, no swelling and no effusion. Tenderness found. Medial joint line tenderness noted.  Neurological: She is alert and oriented to person, place, and time.  Negative straight leg raising test Motor strength is 5/5 bilateral hip flexor knee extensor ankle dorsiflexor. Gait is without evidence of toe drag or knee instability.  Skin: Skin is warm and dry. She is not diaphoretic.  Psychiatric: Judgment and thought content normal. Her affect is inappropriate. Her speech is rapid and/or pressured.  Nursing note and vitals reviewed.  X-ray of the left knee was reviewed, no acute bony was  identified.  Lumbar spine without tenderness palpation except at the lumbosacral junction, mild.  No pain with forward flexion but she does have pain with extension of the lumbar spine.      Assessment & Plan:  1.  Chronic low back pain lumbar spondylosis noted on MRI back in 2007 at L4-5 level.  She has had one successful medial branch block which was performed approximately 18 months ago.  Will repeat.  We discussed that current recommendation is that she can drive to and from the procedure as long as she does not have sedation. We will set up bilateral L3-L4 medial branch L5 dorsal ramus injection  2.  Left knee pain secondary to contusion. Wet reading per PMR no acute bony abnormality, will await radiology report.  Conservative care advised for now.  Consider Voltaren gel  3.  Chronic pain syndrome will obtain controlled substance agreement and urine drug screen.  Continue tramadol 50 mg 3 times daily

## 2017-07-24 ENCOUNTER — Telehealth: Payer: Self-pay

## 2017-07-24 NOTE — Telephone Encounter (Signed)
Mild arthritis no fracture

## 2017-07-24 NOTE — Telephone Encounter (Signed)
Requesting xray results of knee

## 2017-07-25 NOTE — Telephone Encounter (Signed)
Called patient back for xray results, no answer, left VM to return call.

## 2017-07-25 NOTE — Telephone Encounter (Signed)
Informed patient of no fracture, patient relieved

## 2017-07-26 LAB — TOXASSURE SELECT 13 (MW), URINE

## 2017-08-01 ENCOUNTER — Telehealth: Payer: Self-pay | Admitting: *Deleted

## 2017-08-01 NOTE — Telephone Encounter (Signed)
Urine drug screen for this encounter is consistent for prescribed medication 

## 2017-08-14 ENCOUNTER — Ambulatory Visit: Payer: Self-pay | Admitting: Physical Medicine & Rehabilitation

## 2017-08-14 ENCOUNTER — Encounter: Payer: Medicare Other | Attending: Physical Medicine & Rehabilitation

## 2017-08-14 DIAGNOSIS — J449 Chronic obstructive pulmonary disease, unspecified: Secondary | ICD-10-CM | POA: Insufficient documentation

## 2017-08-14 DIAGNOSIS — E7800 Pure hypercholesterolemia, unspecified: Secondary | ICD-10-CM | POA: Insufficient documentation

## 2017-08-14 DIAGNOSIS — Z87891 Personal history of nicotine dependence: Secondary | ICD-10-CM | POA: Insufficient documentation

## 2017-08-14 DIAGNOSIS — F319 Bipolar disorder, unspecified: Secondary | ICD-10-CM | POA: Insufficient documentation

## 2017-08-14 DIAGNOSIS — M47816 Spondylosis without myelopathy or radiculopathy, lumbar region: Secondary | ICD-10-CM | POA: Insufficient documentation

## 2017-08-14 DIAGNOSIS — K2270 Barrett's esophagus without dysplasia: Secondary | ICD-10-CM | POA: Insufficient documentation

## 2017-08-14 DIAGNOSIS — M25562 Pain in left knee: Secondary | ICD-10-CM | POA: Insufficient documentation

## 2017-08-14 DIAGNOSIS — K219 Gastro-esophageal reflux disease without esophagitis: Secondary | ICD-10-CM | POA: Insufficient documentation

## 2017-08-14 DIAGNOSIS — I10 Essential (primary) hypertension: Secondary | ICD-10-CM | POA: Insufficient documentation

## 2017-08-14 DIAGNOSIS — F419 Anxiety disorder, unspecified: Secondary | ICD-10-CM | POA: Insufficient documentation

## 2017-08-16 ENCOUNTER — Other Ambulatory Visit: Payer: Self-pay | Admitting: Physical Medicine & Rehabilitation

## 2017-08-16 NOTE — Telephone Encounter (Signed)
Accidentally printed, called meds into pharmacy, paper script destroyed and shredded.

## 2017-08-17 ENCOUNTER — Ambulatory Visit: Payer: Medicare Other | Admitting: Physical Medicine & Rehabilitation

## 2017-09-10 ENCOUNTER — Ambulatory Visit: Payer: Medicare Other | Admitting: Physical Medicine & Rehabilitation

## 2017-09-10 ENCOUNTER — Encounter: Payer: Medicare Other | Attending: Physical Medicine & Rehabilitation

## 2017-09-10 DIAGNOSIS — F319 Bipolar disorder, unspecified: Secondary | ICD-10-CM | POA: Diagnosis not present

## 2017-09-10 DIAGNOSIS — M25562 Pain in left knee: Secondary | ICD-10-CM | POA: Insufficient documentation

## 2017-09-10 DIAGNOSIS — Z87891 Personal history of nicotine dependence: Secondary | ICD-10-CM | POA: Insufficient documentation

## 2017-09-10 DIAGNOSIS — I10 Essential (primary) hypertension: Secondary | ICD-10-CM | POA: Diagnosis not present

## 2017-09-10 DIAGNOSIS — E7800 Pure hypercholesterolemia, unspecified: Secondary | ICD-10-CM | POA: Diagnosis not present

## 2017-09-10 DIAGNOSIS — F419 Anxiety disorder, unspecified: Secondary | ICD-10-CM | POA: Insufficient documentation

## 2017-09-10 DIAGNOSIS — M47817 Spondylosis without myelopathy or radiculopathy, lumbosacral region: Secondary | ICD-10-CM

## 2017-09-10 DIAGNOSIS — K219 Gastro-esophageal reflux disease without esophagitis: Secondary | ICD-10-CM | POA: Insufficient documentation

## 2017-09-10 DIAGNOSIS — K2270 Barrett's esophagus without dysplasia: Secondary | ICD-10-CM | POA: Insufficient documentation

## 2017-09-10 DIAGNOSIS — J449 Chronic obstructive pulmonary disease, unspecified: Secondary | ICD-10-CM | POA: Insufficient documentation

## 2017-09-10 DIAGNOSIS — M47816 Spondylosis without myelopathy or radiculopathy, lumbar region: Secondary | ICD-10-CM | POA: Diagnosis not present

## 2017-09-10 NOTE — Progress Notes (Signed)
  PROCEDURE RECORD [facility]   Name: Katelyn Thompson DOB:[birth year] MRN: [id number]  Date:09/10/2017  Physician: Claudette Laws, MD    Nurse/CMA: Leroy Kennedy  Allergies:  Allergies  Allergen Reactions  . Aspirin Other (See Comments)    esophagus disease  . Nitrofurantoin Nausea And Vomiting  . Relafen [Nabumetone] Nausea And Vomiting    Consent Signed: Yes.    Is patient diabetic? No.  CBG today?  Pregnant: No. LMP: No LMP recorded. Patient has had a hysterectomy. (age 60-55)  Anticoagulants: no Anti-inflammatory: no Antibiotics: no  Procedure: Medial Branch Block Position: Prone Start Time: 1:43pn   End Time: 1:55pm  Fluoro Time:579  RN/CMA Cloteal Isaacson,CMA Jena Tegeler,CMA    Time 1:09pm 2:01pm    BP 116/78 130 84    Pulse 67 67    Respirations 14 14    O2 Sat 98 96    S/S 6 6    Pain Level 8/10 4/10     D/C home with nobody, patient A & O X 3, D/C instructions reviewed, and sits independently.

## 2017-09-10 NOTE — Procedures (Signed)
Bilateral Lumbar L3, L4  medial branch blocks and L 5 dorsal ramus injection under fluoroscopic guidance  Indication: Lumbar pain which is not relieved by medication management or other conservative care and interfering with self-care and mobility.  Informed consent was obtained after describing risks and benefits of the procedure with the patient, this includes bleeding, infection, paralysis and medication side effects.  The patient wishes to proceed and has given written consent.  The patient was placed in prone position.  The lumbar area was marked and prepped with Betadine.  One mL of 1% lidocaine was injected into each of 6 areas into the skin and subcutaneous tissue.  Then a 22-gauge 3.5in spinal needle was inserted targeting the junction of the left S1 superior articular process and sacral ala junction. Needle was advanced under fluoroscopic guidance.  Bone contact was made.  Isovue 200 was injected x 0.5 mL demonstrating no intravascular uptake.  Then a solution  of 2% MPF lidocaine was injected x 0.5 mL.  Then the left L5 superior articular process in transverse process junction was targeted.  Bone contact was made.  Isovue 200 was injected x 0.5 mL demonstrating no intravascular uptake. Then a solution containing  2% MPF lidocaine was injected x 0.5 mL.  Then the left L4 superior articular process in transverse process junction was targeted.  Bone contact was made.  Isovue 200 was injected x 0.5 mL demonstrating no intravascular uptake.  Then a solution containing2% MPF lidocaine was injected x 0.5 mL.  This same procedure was performed on the right side using the same needle, technique and injectate.  Patient tolerated procedure well.  Post procedure instructions were given.  

## 2017-09-10 NOTE — Patient Instructions (Signed)

## 2017-10-03 IMAGING — DX DG KNEE AP/LAT W/ SUNRISE*L*
3 series · 3 of 3 positions shown · non-contrast
Comparison: Prior knee radiographs 01/07/2016

CLINICAL DATA: 51-year-old female with dull throbbing left knee
pain and recent joint effusion

EXAM:
LEFT KNEE 3 VIEWS

[knee ap]
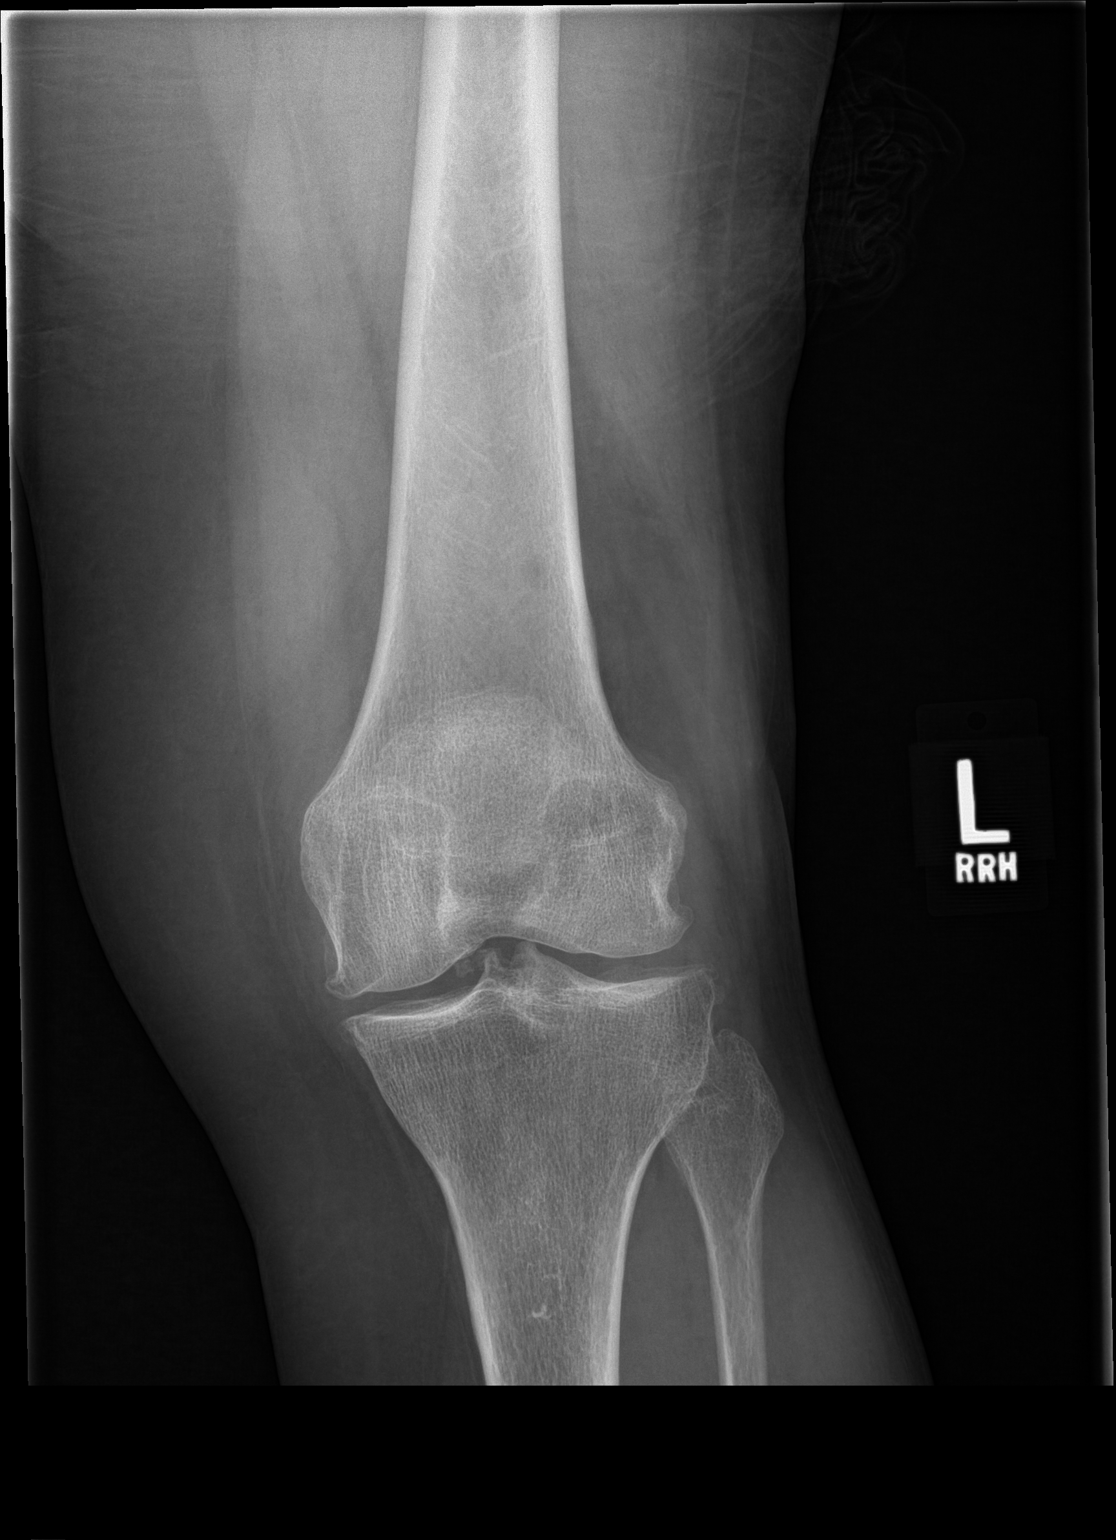

[knee lat]
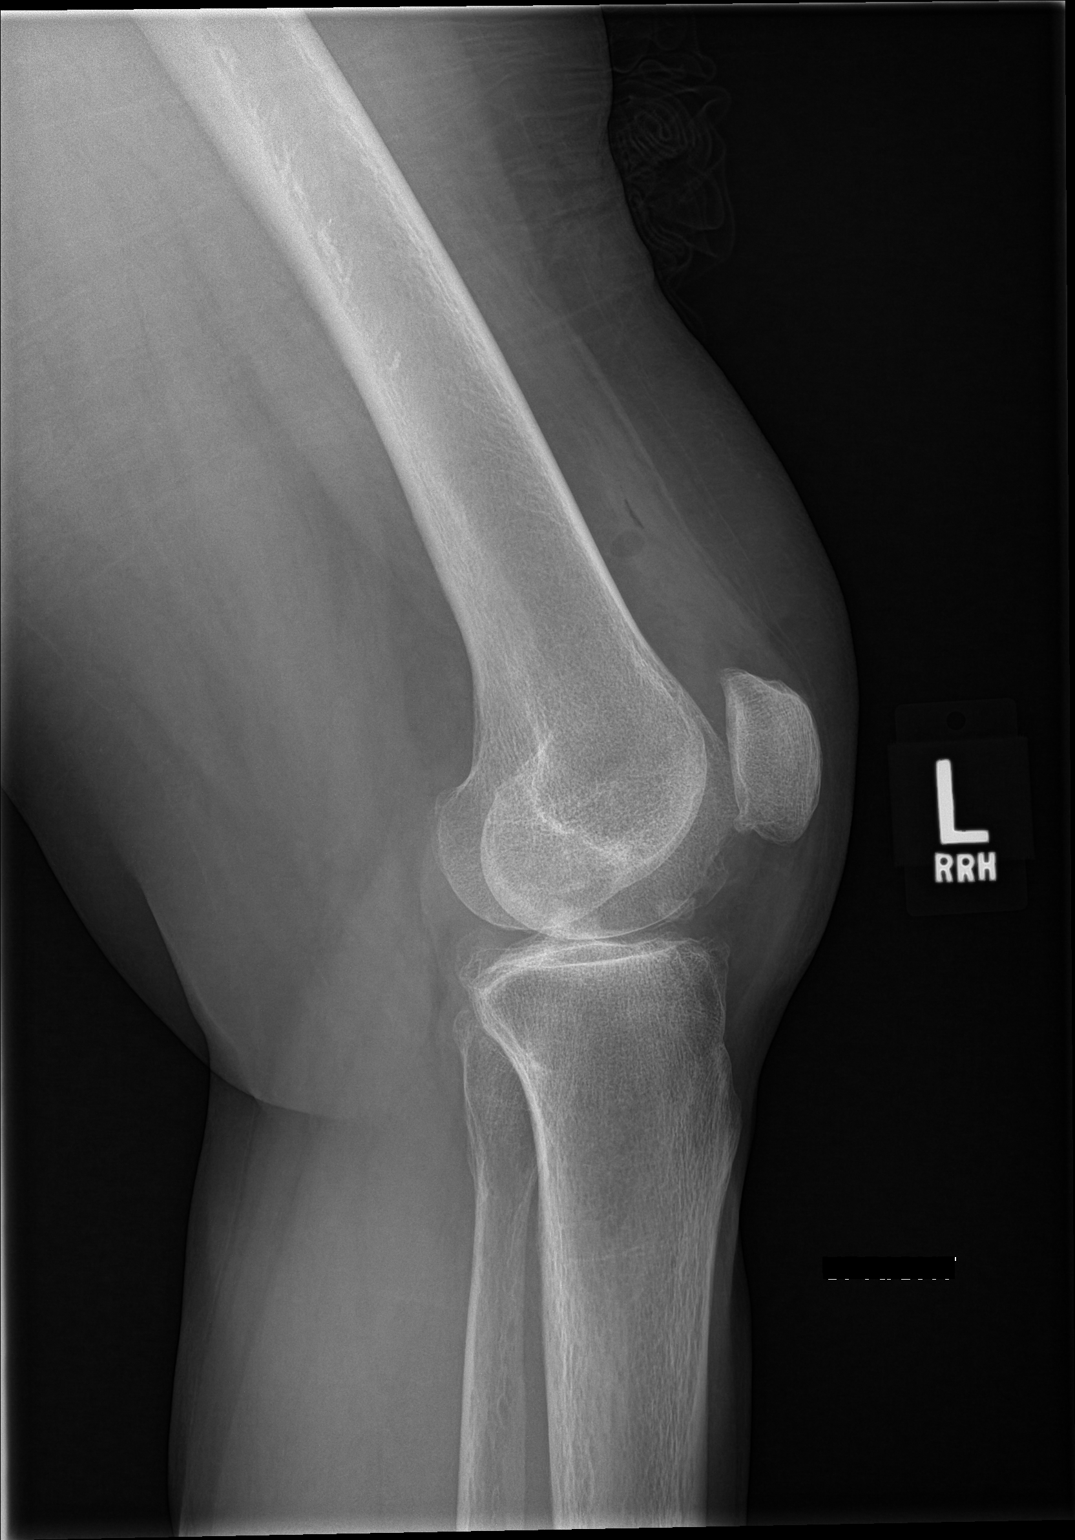

[patella skyline]
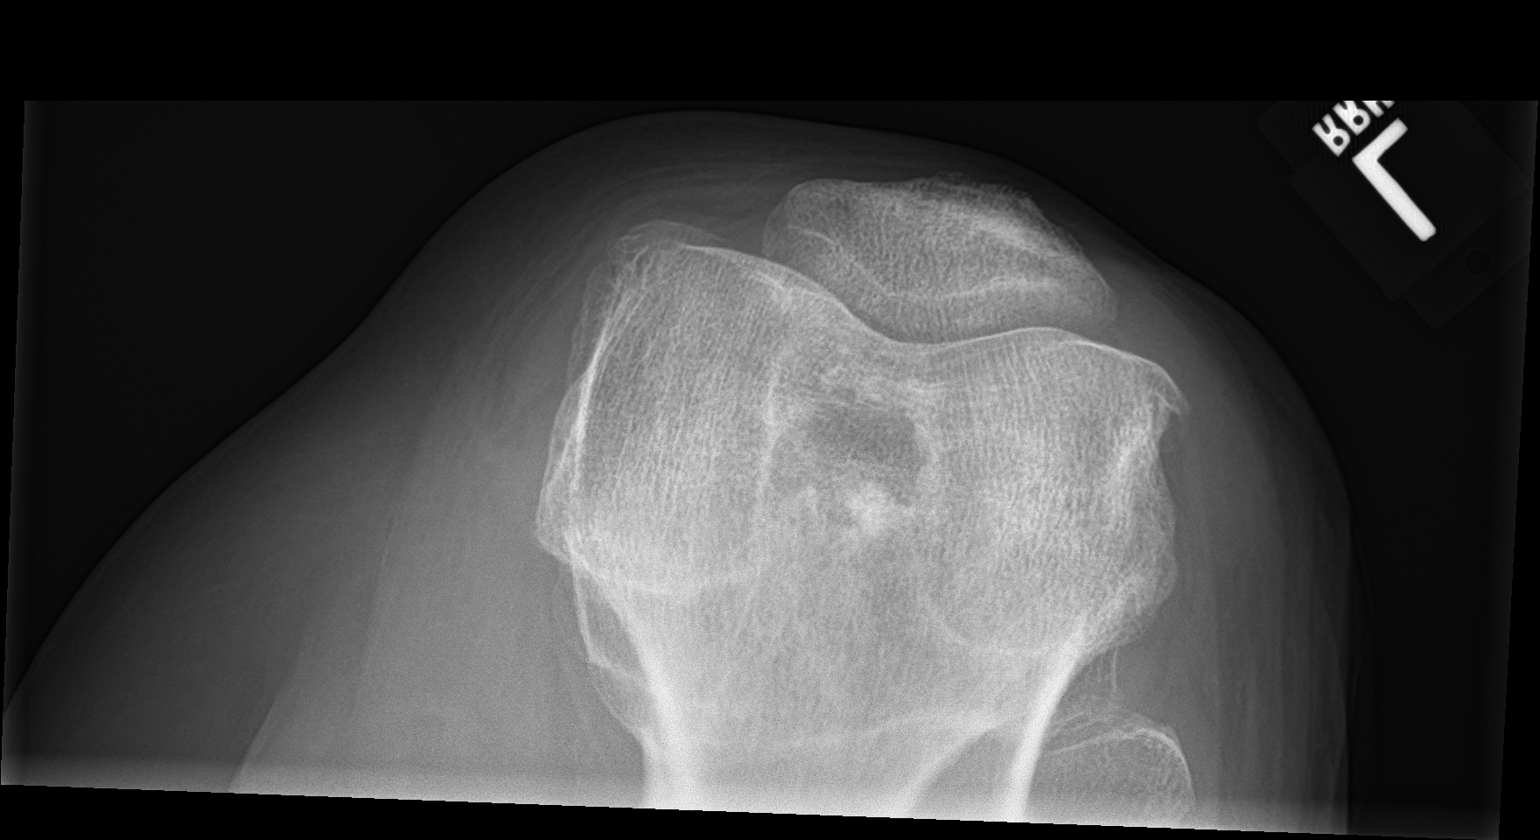

[3 of 3 positions shown; findings below may reference images not displayed]

FINDINGS: Small amount of gas in the suprapatellar recess consistent with the
clinical history of recent arthrocentesis. There is a small residual
suprapatellar knee joint effusion. No evidence of acute fracture or
malalignment. Slight interval progression of a modest
tricompartmental degenerative osteoarthritis. A 7 mm well cord gated
os ossific body is present within the anterior aspect of the medial
compartment. This is concerning for an intra-articular loose body.
No lytic or blastic osseous lesion.
IMPRESSION: 1. Suspect 7 mm intra-articular loose body in the anterior aspect of
the medial compartment.
2. Slight interval progression of tricompartmental degenerative
osteoarthritis compared to 01/07/2016.
3. Small suprapatellar knee joint effusion with a single locule of
gas superiorly consistent with the clinical history of recent
arthrocentesis.

## 2017-10-08 ENCOUNTER — Ambulatory Visit: Payer: Medicare Other | Admitting: Physical Medicine & Rehabilitation

## 2017-10-16 IMAGING — CR DG KNEE 3 VIEWS*L*
3 series · 3 of 3 positions shown · non-contrast
Comparison: 06/08/2016

CLINICAL DATA: C/o left knee pain started approx a week ago-- hx of
"fluid build up on knee" -- seen at [HOSPITAL] -- discharged without
"anything being done" -- has right knee replaced. Pt states " I was
driving yesterday and her knee popped then something catched". Fluid
restrained off the knee 2 weeks ago. Pain in the lateral aspect of
the knee. Unable to stand or straighten knee.

EXAM:
LEFT KNEE - 3 VIEW

[knee ap]
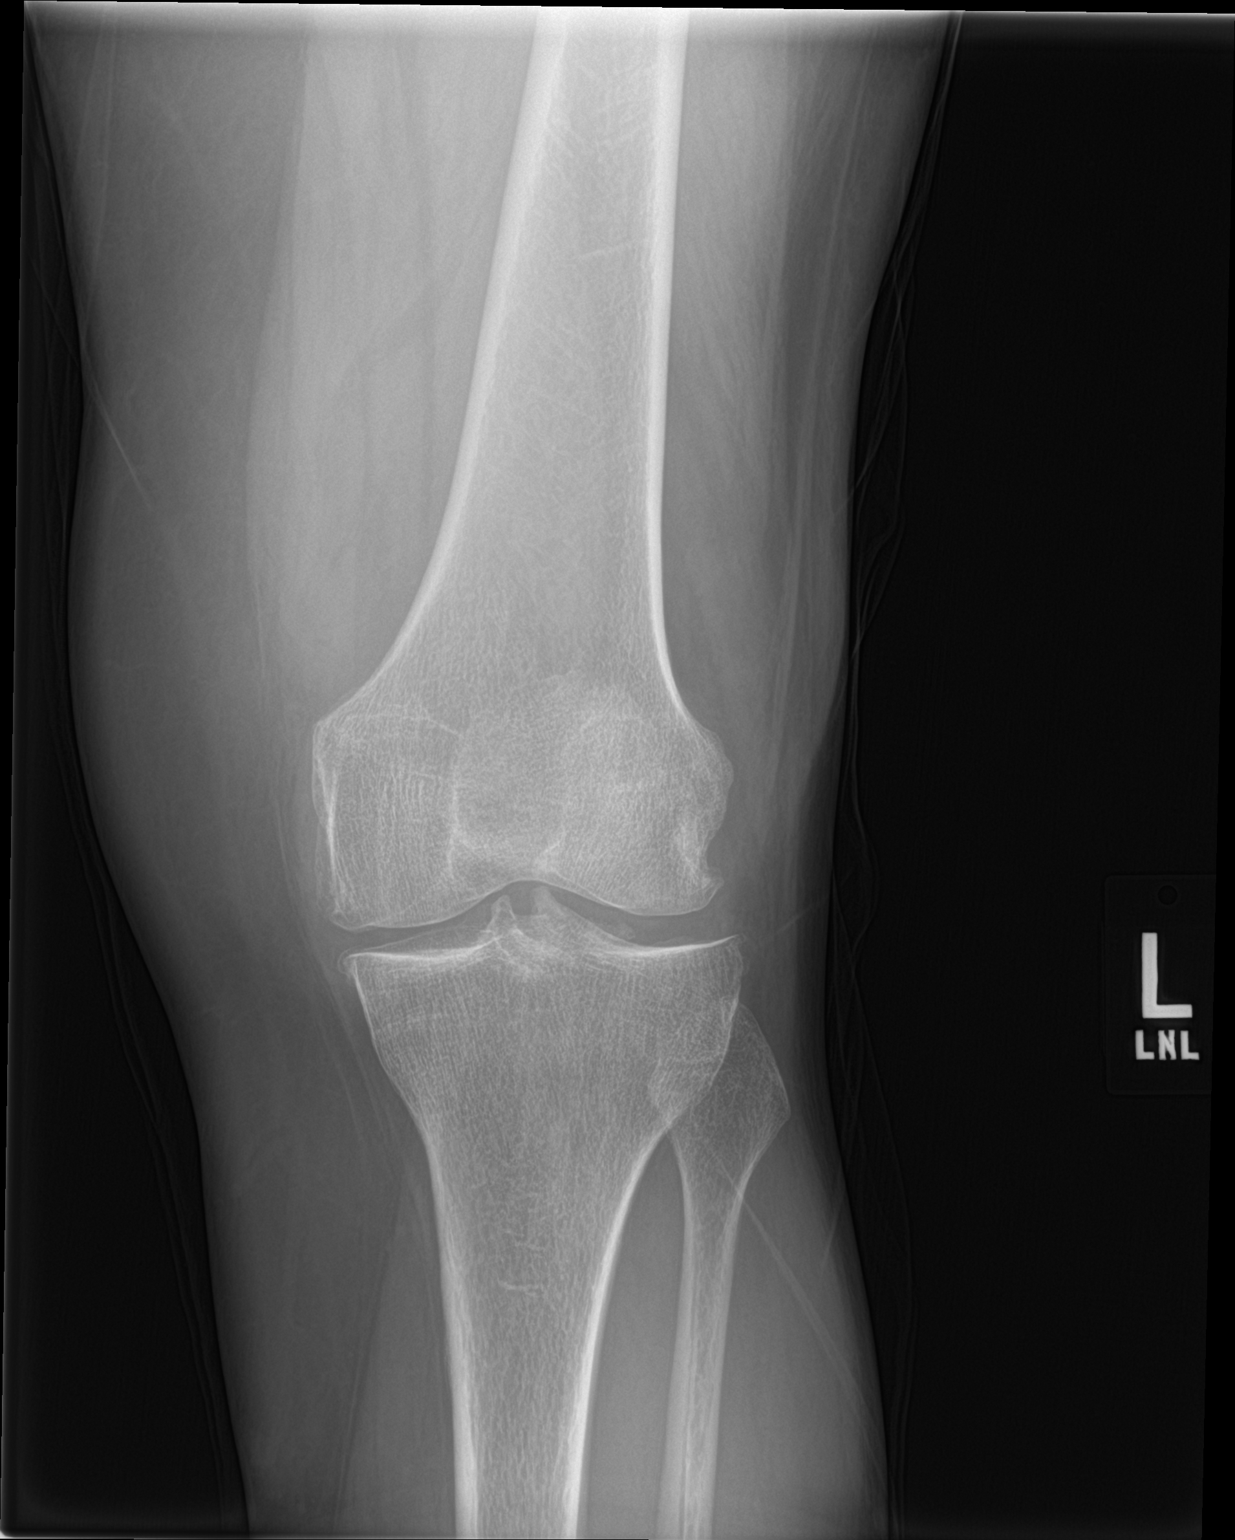

[knee lat]
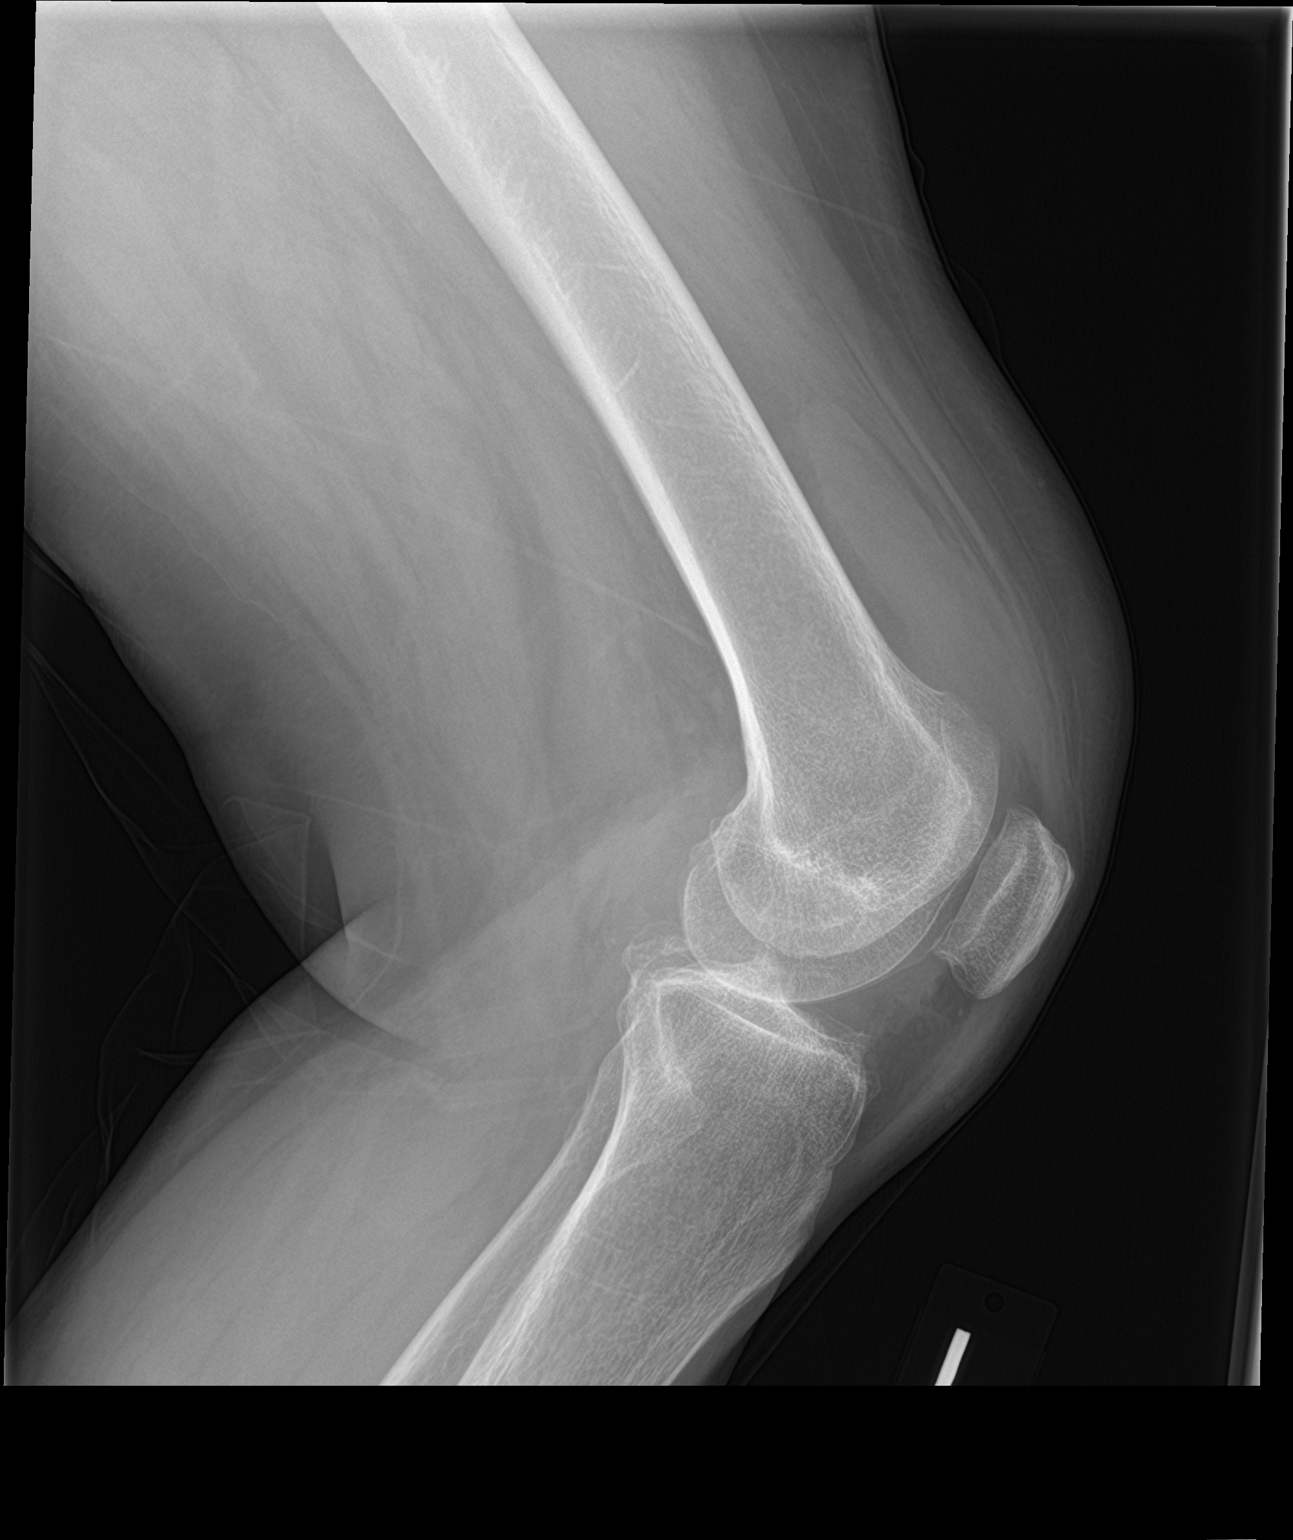

[knee sunrise]
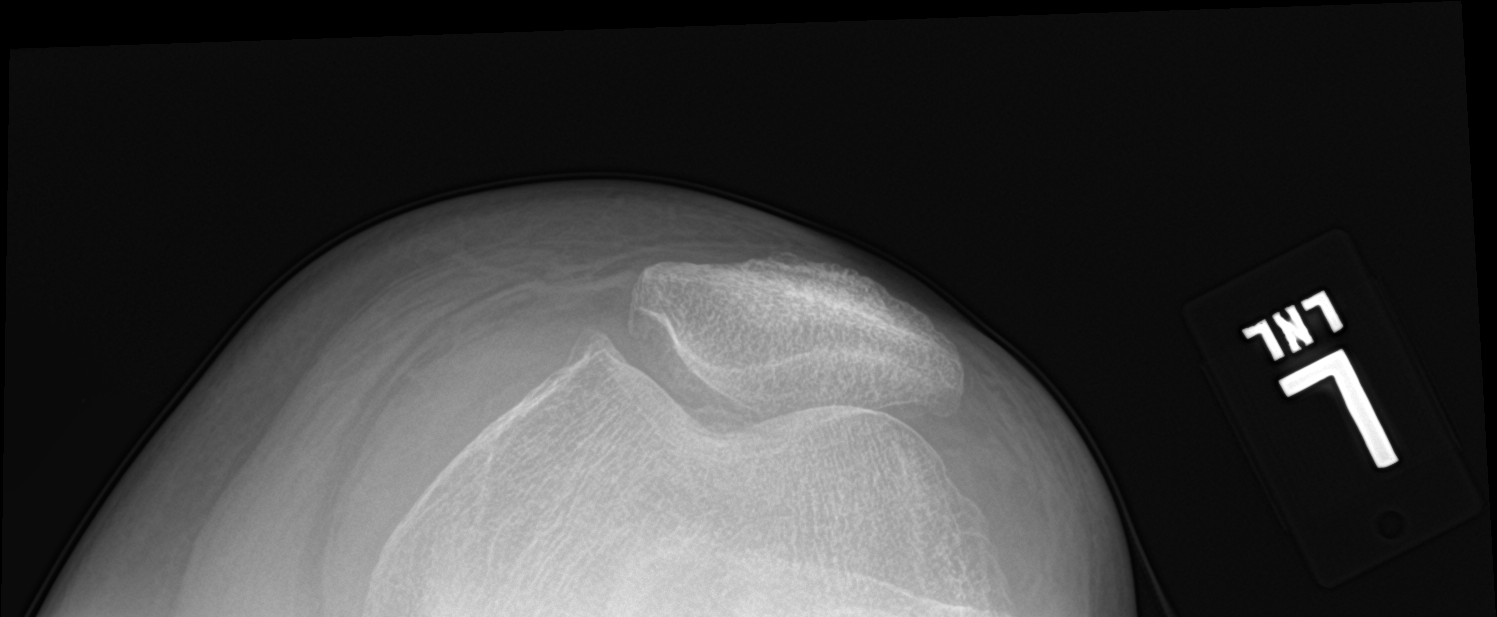

[3 of 3 positions shown; findings below may reference images not displayed]

FINDINGS: Moderate joint effusion is present. There is no acute fracture.
Degenerative changes are identified primarily involving the medial
and patellofemoral compartments. Suspect intra-articular loose body
along the lateral joint space.
IMPRESSION: 1. Moderate joint effusion.
2. Intra-articular loose body suspected.

## 2017-10-22 ENCOUNTER — Ambulatory Visit: Payer: Medicare Other

## 2017-10-22 ENCOUNTER — Ambulatory Visit: Payer: Medicare Other | Admitting: Physical Medicine & Rehabilitation

## 2017-11-02 ENCOUNTER — Encounter: Payer: Medicare Other | Attending: Physical Medicine & Rehabilitation

## 2017-11-02 ENCOUNTER — Ambulatory Visit: Payer: Medicare Other | Admitting: Physical Medicine & Rehabilitation

## 2017-11-02 ENCOUNTER — Encounter: Payer: Self-pay | Admitting: Physical Medicine & Rehabilitation

## 2017-11-02 VITALS — BP 112/83 | HR 81 | Resp 14 | Ht 65.0 in | Wt 226.0 lb

## 2017-11-02 DIAGNOSIS — E7800 Pure hypercholesterolemia, unspecified: Secondary | ICD-10-CM | POA: Insufficient documentation

## 2017-11-02 DIAGNOSIS — M47817 Spondylosis without myelopathy or radiculopathy, lumbosacral region: Secondary | ICD-10-CM | POA: Diagnosis not present

## 2017-11-02 DIAGNOSIS — I10 Essential (primary) hypertension: Secondary | ICD-10-CM | POA: Diagnosis not present

## 2017-11-02 DIAGNOSIS — M25562 Pain in left knee: Secondary | ICD-10-CM | POA: Insufficient documentation

## 2017-11-02 DIAGNOSIS — M47816 Spondylosis without myelopathy or radiculopathy, lumbar region: Secondary | ICD-10-CM | POA: Diagnosis not present

## 2017-11-02 DIAGNOSIS — J449 Chronic obstructive pulmonary disease, unspecified: Secondary | ICD-10-CM | POA: Insufficient documentation

## 2017-11-02 DIAGNOSIS — F319 Bipolar disorder, unspecified: Secondary | ICD-10-CM | POA: Insufficient documentation

## 2017-11-02 DIAGNOSIS — K219 Gastro-esophageal reflux disease without esophagitis: Secondary | ICD-10-CM | POA: Insufficient documentation

## 2017-11-02 DIAGNOSIS — Z87891 Personal history of nicotine dependence: Secondary | ICD-10-CM | POA: Diagnosis not present

## 2017-11-02 DIAGNOSIS — F419 Anxiety disorder, unspecified: Secondary | ICD-10-CM | POA: Insufficient documentation

## 2017-11-02 DIAGNOSIS — K2270 Barrett's esophagus without dysplasia: Secondary | ICD-10-CM | POA: Insufficient documentation

## 2017-11-02 NOTE — Patient Instructions (Signed)
Continue weight loss effort

## 2017-11-02 NOTE — Progress Notes (Signed)
Subjective:    Patient ID: Katelyn Thompson, female    DOB: 12-22-1965, 52 y.o.   MRN: [id number]  HPI 52 year old female with history of chronic low back pain.  She has radiographic evidence of lumbar spondylosis, facet hypertrophy L4-5 L5-S1 02/28/2016 bilateral medial branch blocks L3-4 as well as L5 dorsal ramus injection preinjection pain 10/10 postinjection pain 4/10 09/10/2017 underwent repeat bilateral medial branch blocks L3-4 as well as L5 dorsal ramus injection, preinjection pain level 8/10 postinjection pain level 4/10, pain relief for approximately 2 weeks Patient returns today discussed options Pain Inventory Average Pain 8 Pain Right Now 8 My pain is constant, sharp, stabbing, tingling and aching  In the last 24 hours, has pain interfered with the following? General activity 7 Relation with others 5 Enjoyment of life 7 What TIME of day is your pain at its worst? all Sleep (in general) Poor  Pain is worse with: walking, bending, sitting, standing and some activites Pain improves with: nothing Relief from Meds: 0  Mobility walk without assistance walk with assistance use a cane ability to climb steps?  yes do you drive?  yes  Function disabled: date disabled . I need assistance with the following:  household duties and shopping  Neuro/Psych bladder control problems weakness numbness tingling trouble walking spasms dizziness depression anxiety  Prior Studies Any changes since last visit?  no  Physicians involved in your care Any changes since last visit?  no   Family History  Problem Relation Age of Onset  . Heart disease Mother   . Diabetes Mother   . Asthma Mother   . Lung disease Mother   . Arthritis Mother   . Allergies Mother   . Heart attack Brother 48       deceased  . Colon cancer Neg Hx    Social History   Socioeconomic History  . Marital status: Married    Spouse name: Not on file  . Number of children: Not on file  . Years  of education: 9th grade  . Highest education level: Not on file  Occupational History  . Occupation: disabled    Associate Professor: NOT EMPLOYED  Social Needs  . Financial resource strain: Not on file  . Food insecurity:    Worry: Not on file    Inability: Not on file  . Transportation needs:    Medical: Not on file    Non-medical: Not on file  Tobacco Use  . Smoking status: Former Smoker    Packs/day: 2.00    Years: 34.00    Pack years: 68.00    Types: Cigarettes    Last attempt to quit: 12/10/2015    Years since quitting: 1.8  . Smokeless tobacco: Never Used  . Tobacco comment: up to 3 ppd  Substance and Sexual Activity  . Alcohol use: No    Comment: rarely  . Drug use: No  . Sexual activity: Yes    Birth control/protection: Surgical  Lifestyle  . Physical activity:    Days per week: Not on file    Minutes per session: Not on file  . Stress: Not on file  Relationships  . Social connections:    Talks on phone: Not on file    Gets together: Not on file    Attends religious service: Not on file    Active member of club or organization: Not on file    Attends meetings of clubs or organizations: Not on file    Relationship status: Not on  file  Other Topics Concern  . Not on file  Social History Narrative  . Not on file   Past Surgical History:  Procedure Laterality Date  . ABDOMINAL HYSTERECTOMY    . ANKLE ARTHROSCOPY WITH DRILLING/MICROFRACTURE  02/26/2012   Procedure: ANKLE ARTHROSCOPY WITH DRILLING/MICROFRACTURE;  Surgeon: Vickki Hearing, MD;  Location: [facility];  Service: Orthopedics;  Laterality: Right;  . COLONOSCOPY  11/20/2011   RMR: Friable anorectum; single anal canal hemorrhoidal tag. Otherwise normal rectum and colon  . ESOPHAGOGASTRODUODENOSCOPY  06/2010   diagnosed with Barrett's, small hh, esophagus dilated. Next EGD 06/2011  . ESOPHAGOGASTRODUODENOSCOPY  Aug 2013   RMR: abnormal distal esophagus consistent with prior dx of SS Barrett;s. s/p dilation. small  hiatal hernia. Biopsies: no metaplasia, dysplasia, malignancy. SURVEILLANCE AUG 2016  . EXAM UNDER ANESTHESIA WITH MANIPULATION OF KNEE Right 09/16/2012   Procedure: EXAM UNDER ANESTHESIA WITH MANIPULATION OF RIGHT KNEE;  Surgeon: Vickki Hearing, MD;  Location: [facility];  Service: Orthopedics;  Laterality: Right;  . FOOT SURGERY     x 2  . KNEE ARTHROSCOPY  02/26/2012   Procedure: ARTHROSCOPY KNEE;  Surgeon: Vickki Hearing, MD;  Location: [facility];  Service: Orthopedics;  Laterality: Right;  with three chondroplasties  . KNEE ARTHROTOMY Left 11/21/2013   Procedure: KNEE ARTHROSCOPY  WITH MEDIAL, LATERAL, AND PATELLA  FEMORAL CHONDROPLASTY;  Surgeon: Vickki Hearing, MD;  Location: [facility];  Service: Orthopedics;  Laterality: Left;  . s/p hysterectomy    . SINUS SURGERY WITH INSTATRAK    . SURGERY OF LIP     biopsy  . TOTAL KNEE ARTHROPLASTY Right 07/01/2012   Procedure: TOTAL KNEE ARTHROPLASTY;  Surgeon: Vickki Hearing, MD;  Location: [facility];  Service: Orthopedics;  Laterality: Right;  . TUBAL LIGATION     Past Medical History:  Diagnosis Date  . Anxiety   . Anxiety and depression    Does not like being around lots of people  . Arthritis   . Barrett's esophagus without dysplasia   . Bipolar 1 disorder (HCC)   . COPD (chronic obstructive pulmonary disease) (HCC)   . Depression   . GERD (gastroesophageal reflux disease)   . HTN (hypertension)   . Hypercholesteremia   . MS (multiple sclerosis) (HCC)    but not offcially disgnosed  . Nerve damage    Severe to left foot  . OA (osteoarthritis) of knee 01/31/2012  . PONV (postoperative nausea and vomiting)    BP 112/83 (BP Location: Left Arm, Patient Position: Sitting, Cuff Size: Large)   Pulse 81   Resp 14   Ht 5\' 5"  (1.651 m)   Wt 226 lb (102.5 kg)   SpO2 95%   BMI 37.61 kg/m   Opioid Risk Score:   Fall Risk Score:  `1  Depression screen PHQ 2/9  Depression screen [facility] 2/9 02/19/2017 02/11/2016  Decreased Interest 1  3  Down, Depressed, Hopeless 1 3  PHQ - 2 Score 2 6  Altered sleeping - 2  Tired, decreased energy - 2  Change in appetite - 2  Feeling bad or failure about yourself  - 3  Trouble concentrating - 2  Moving slowly or fidgety/restless - 2  Suicidal thoughts - 1  PHQ-9 Score - 20  Some recent data might be hidden    Review of Systems  Constitutional: Positive for diaphoresis.  Respiratory: Positive for shortness of breath.   Gastrointestinal: Positive for abdominal pain.  Genitourinary: Positive for difficulty urinating.  Musculoskeletal:  Positive for arthralgias, back pain and gait problem.       Spasms  Skin: Negative.   Neurological: Positive for dizziness, weakness and numbness.       Tingling  Psychiatric/Behavioral: Positive for dysphoric mood. The patient is nervous/anxious.   All other systems reviewed and are negative.      Objective:   Physical Exam  Constitutional: She is oriented to person, place, and time. She appears well-developed and well-nourished.  Overweight  HENT:  Head: Normocephalic and atraumatic.  Eyes: Pupils are equal, round, and reactive to light. EOM are normal.  Musculoskeletal:  Knee contracture approximately 30 degrees on the right side flexion  Neurological: She is alert and oriented to person, place, and time. Gait abnormal.  Motor strength is 5/5 bilateral hip flexor knee extensor ankle dorsiflexor Negative straight leg raise Antalgic gait favoring the right leg   Skin: Skin is warm and dry.  Psychiatric: She has a normal mood and affect.  Nursing note and vitals reviewed.         Assessment & Plan:  1.  Lumbar spondylosis without myelopathy Recommend right L3-L4 medial branch right L5 dorsal ramus radiofrequency neurotomy under fluoroscopic guidance 2.  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

## 2017-12-03 ENCOUNTER — Encounter: Payer: Medicare Other | Attending: Physical Medicine & Rehabilitation

## 2017-12-03 ENCOUNTER — Ambulatory Visit: Payer: Medicare Other | Admitting: Physical Medicine & Rehabilitation

## 2017-12-03 ENCOUNTER — Encounter: Payer: Self-pay | Admitting: Physical Medicine & Rehabilitation

## 2017-12-03 VITALS — BP 122/84 | HR 77 | Ht 65.0 in | Wt 226.0 lb

## 2017-12-03 DIAGNOSIS — Z87891 Personal history of nicotine dependence: Secondary | ICD-10-CM | POA: Insufficient documentation

## 2017-12-03 DIAGNOSIS — K219 Gastro-esophageal reflux disease without esophagitis: Secondary | ICD-10-CM | POA: Insufficient documentation

## 2017-12-03 DIAGNOSIS — M47816 Spondylosis without myelopathy or radiculopathy, lumbar region: Secondary | ICD-10-CM | POA: Insufficient documentation

## 2017-12-03 DIAGNOSIS — Z5181 Encounter for therapeutic drug level monitoring: Secondary | ICD-10-CM

## 2017-12-03 DIAGNOSIS — J449 Chronic obstructive pulmonary disease, unspecified: Secondary | ICD-10-CM | POA: Insufficient documentation

## 2017-12-03 DIAGNOSIS — F419 Anxiety disorder, unspecified: Secondary | ICD-10-CM | POA: Insufficient documentation

## 2017-12-03 DIAGNOSIS — K2270 Barrett's esophagus without dysplasia: Secondary | ICD-10-CM | POA: Insufficient documentation

## 2017-12-03 DIAGNOSIS — E7800 Pure hypercholesterolemia, unspecified: Secondary | ICD-10-CM | POA: Insufficient documentation

## 2017-12-03 DIAGNOSIS — M25562 Pain in left knee: Secondary | ICD-10-CM | POA: Insufficient documentation

## 2017-12-03 DIAGNOSIS — F319 Bipolar disorder, unspecified: Secondary | ICD-10-CM | POA: Diagnosis not present

## 2017-12-03 DIAGNOSIS — L299 Pruritus, unspecified: Secondary | ICD-10-CM

## 2017-12-03 DIAGNOSIS — Z79891 Long term (current) use of opiate analgesic: Secondary | ICD-10-CM

## 2017-12-03 DIAGNOSIS — M47817 Spondylosis without myelopathy or radiculopathy, lumbosacral region: Secondary | ICD-10-CM | POA: Diagnosis not present

## 2017-12-03 DIAGNOSIS — I10 Essential (primary) hypertension: Secondary | ICD-10-CM | POA: Insufficient documentation

## 2017-12-03 DIAGNOSIS — G894 Chronic pain syndrome: Secondary | ICD-10-CM

## 2017-12-03 MED ORDER — GABAPENTIN 600 MG PO TABS: 600.0000 mg | ORAL_TABLET | Freq: Three times a day (TID) | ORAL | 0 refills | Status: DC

## 2017-12-03 MED ORDER — DIPHENHYDRAMINE HCL 50 MG/ML IJ SOLN
25.0000 mg | Freq: Once | INTRAMUSCULAR | Status: AC
Start: 2017-12-03 — End: 2017-12-03
  Administered 2017-12-03: 25 mg via INTRAMUSCULAR

## 2017-12-03 NOTE — Progress Notes (Signed)
  PROCEDURE RECORD [facility]   Name: Katelyn Thompson DOB:[birth year] MRN: [id number]  Date:12/03/2017  Physician: Claudette Laws, MD    Nurse/CMA: Donald Jacque, CMA  Allergies:  Allergies  Allergen Reactions  . Aspirin Other (See Comments)    esophagus disease  . Nitrofurantoin Nausea And Vomiting  . Relafen [Nabumetone] Nausea And Vomiting    Consent Signed: Yes.    Is patient diabetic? No.  CBG today?   Pregnant: No. LMP: No LMP recorded. Patient has had a hysterectomy. (age 78-55)  Anticoagulants: no Anti-inflammatory: no Antibiotics: no  Procedure: left L3,4,5 radiofrequency neurotomy  Position: Prone Start Time: 2:17pm  End Time: 2:35pm  Fluoro Time: 58  RN/CMA Katelyn Thompson, CMA Katelyn Thompson, CMA    Time 2:00pm 2:40pm    BP 122/84 152/89    Pulse 77 61    Respirations 14 14    O2 Sat 94 96    S/S 6 6    Pain Level 8/10 4/10     D/C home with sister, patient A & O X 3, D/C instructions reviewed, and sits independently.

## 2017-12-03 NOTE — Progress Notes (Signed)
RightL5 dorsal ramus., Right L4 and Right L3 medial branch radio frequency neurotomy under fluoroscopic guidance   Indication: Low back pain due to lumbar spondylosis which has been relieved on 2 occasions by greater than 50% by lumbar medial branch blocks at corresponding levels.  Informed consent was obtained after describing risks and benefits of the procedure with the patient, this includes bleeding, bruising, infection, paralysis and medication side effects. The patient wishes to proceed and has given written consent. The patient was placed in a prone position. The lumbar and sacral area was marked and prepped with Betadine. A 25-gauge 1-1/2 inch needle was inserted into the skin and subcutaneous tissue at 3 sites in one ML of 1% lidocaine was injected into each site. Then a 18-gauge 10 cm radio frequency needle with a 1 cm curved active tip was inserted targeting the Right S1 SAP/sacral ala junction. Bone contact was made and confirmed with lateral imaging.  motor stimulation at 2 Hz confirm proper needle location followed by injection of 54ml 2% MPF lidocaine. Then the Right L5 SAP/transverse process junction was targeted. Bone contact was made and confirmed with lateral imaging.  motor stimulation at 2 Hz confirm proper needle location followed by injection of 37ml 2% MPF lidocaine. Then the Right L4 SAP/transverse process junction was targeted. Bone contact was made and confirmed with lateral imaging. motor stimulation at 2 Hz confirm proper needle location followed by injection of 40ml 2% MPF lidocaine. Radio frequency lesion being at [facility] for 90 seconds was performed. Needles were removed. Post procedure instructions and vital signs were performed. Patient tolerated procedure well. Followup appointment was given.  Patient complained that 1/2-hour after the procedure she started to have some itching on her face arm. Examination at that time showed no evidence of any kind of skin rash.  Patient has had  lidocaine during the medial branch blocks which preceded the radiofrequency neurotomy on 2 occasions without issues.  She has no history of lidocaine allergy..  We discussed that we did not add any other new medications.  She states she does not have any Benadryl at home for her itching symptoms therefore we administered 25 mg IM in clinic.

## 2017-12-06 LAB — TOXASSURE SELECT,+ANTIDEPR,UR

## 2017-12-07 ENCOUNTER — Telehealth: Payer: Self-pay | Admitting: *Deleted

## 2017-12-07 NOTE — Telephone Encounter (Signed)
Urine drug screen for this encounter is consistent for prescribed medication 

## 2017-12-31 ENCOUNTER — Other Ambulatory Visit: Payer: Self-pay | Admitting: Physical Medicine & Rehabilitation

## 2018-01-03 ENCOUNTER — Encounter: Payer: Medicare Other | Attending: Physical Medicine & Rehabilitation

## 2018-01-03 ENCOUNTER — Ambulatory Visit: Payer: Medicare Other | Admitting: Physical Medicine & Rehabilitation

## 2018-01-03 ENCOUNTER — Encounter: Payer: Self-pay | Admitting: Physical Medicine & Rehabilitation

## 2018-01-03 ENCOUNTER — Other Ambulatory Visit: Payer: Self-pay

## 2018-01-03 VITALS — BP 127/87 | HR 85 | Ht 64.0 in | Wt 227.0 lb

## 2018-01-03 DIAGNOSIS — M47817 Spondylosis without myelopathy or radiculopathy, lumbosacral region: Secondary | ICD-10-CM | POA: Diagnosis not present

## 2018-01-03 DIAGNOSIS — K2270 Barrett's esophagus without dysplasia: Secondary | ICD-10-CM | POA: Insufficient documentation

## 2018-01-03 DIAGNOSIS — K219 Gastro-esophageal reflux disease without esophagitis: Secondary | ICD-10-CM | POA: Diagnosis not present

## 2018-01-03 DIAGNOSIS — F419 Anxiety disorder, unspecified: Secondary | ICD-10-CM | POA: Diagnosis not present

## 2018-01-03 DIAGNOSIS — E7800 Pure hypercholesterolemia, unspecified: Secondary | ICD-10-CM | POA: Diagnosis not present

## 2018-01-03 DIAGNOSIS — I10 Essential (primary) hypertension: Secondary | ICD-10-CM | POA: Diagnosis not present

## 2018-01-03 DIAGNOSIS — Z87891 Personal history of nicotine dependence: Secondary | ICD-10-CM | POA: Diagnosis not present

## 2018-01-03 DIAGNOSIS — J449 Chronic obstructive pulmonary disease, unspecified: Secondary | ICD-10-CM | POA: Insufficient documentation

## 2018-01-03 DIAGNOSIS — F319 Bipolar disorder, unspecified: Secondary | ICD-10-CM | POA: Diagnosis not present

## 2018-01-03 DIAGNOSIS — M25562 Pain in left knee: Secondary | ICD-10-CM | POA: Insufficient documentation

## 2018-01-03 DIAGNOSIS — M47816 Spondylosis without myelopathy or radiculopathy, lumbar region: Secondary | ICD-10-CM | POA: Diagnosis present

## 2018-01-03 MED ORDER — GABAPENTIN 600 MG PO TABS: 600.0000 mg | ORAL_TABLET | Freq: Three times a day (TID) | ORAL | 0 refills | Status: AC

## 2018-01-03 NOTE — Progress Notes (Signed)
Left L5 dorsal ramus., left L4 and left L3 medial branch radio frequency neurotomy under fluoroscopic guidance  Indication: Low back pain due to lumbar spondylosis which has been relieved on 2 occasions by greater than 50% by lumbar medial branch blocks at corresponding levels.  Informed consent was obtained after describing risks and benefits of the procedure with the patient, this includes bleeding, bruising, infection, paralysis and medication side effects. The patient wishes to proceed and has given written consent. The patient was placed in a prone position. The lumbar and sacral area was marked and prepped with Betadine. A 25-gauge 1-1/2 inch needle was inserted into the skin and subcutaneous tissue at 3 sites in one ML of 1% lidocaine was injected into each site. Then a 18-gauge 10 cm radio frequency needle with a 1 cm curved active tip was inserted targeting the left S1 SAP/sacral ala junction. Bone contact was made and confirmed with lateral imaging.  motor stimulation at 2 Hz confirm proper needle location followed by injection of one ML of 1% MPF lidocaine. Then the left L5 SAP/transverse process junction was targeted. Bone contact was made and confirmed with lateral imaging motor stimulation at 2 Hz confirm proper needle location followed by injection of one ML of the solution containing one ML of  1% MPF lidocaine. Then the left L4 SAP/transverse process junction was targeted. Bone contact was made and confirmed with lateral imaging. motor stimulation at 2 Hz confirm proper needle location followed by injection of one ML of the solution containing one ML of1% MPF lidocaine. Radio frequency lesion  at 80C for 90 seconds was performed. Needles were removed. Post procedure instructions and vital signs were performed. Patient tolerated procedure well. Followup appointment was given.  

## 2018-01-03 NOTE — Progress Notes (Signed)
  PROCEDURE RECORD [facility]   Name: Katelyn Thompson DOB:1965-08-28 MRN: [id number]  Date:01/03/2018  Physician: Claudette Laws, MD    Nurse/CMA: Libero Puthoff, CMA   Allergies:  Allergies  Allergen Reactions  . Aspirin Other (See Comments)    esophagus disease heartburn  . Nabumetone Nausea And Vomiting and Other (See Comments)  . Nitrofurantoin Nausea And Vomiting  . Red Dye Rash    Kdc:red Dye+yellow Dye+nitrofurantoin+brilliant Blue Fcf    Consent Signed: Yes.    Is patient diabetic? No.  CBG today?   Pregnant: No. LMP: No LMP recorded. Patient has had a hysterectomy. (age 28-55)  Anticoagulants: no Anti-inflammatory: no Antibiotics: no  Procedure: left L3-5 radiofrequency neurotomy  Position: Prone Start Time: 2:20pm  End Time: 2:32pm  Fluoro Time: 43s  RN/CMA Sheyna Pettibone, CMA Kaedyn Belardo, CMA    Time 1:30pm 2:40pm    BP 127/87 156/95    Pulse 87 82    Respirations 14 14    O2 Sat 93 98    S/S 6 6    Pain Level 8/10 4/10     D/C home with daughter in law, patient A & O X 3, D/C instructions reviewed, and sits independently.

## 2018-01-03 NOTE — Patient Instructions (Signed)

## 2018-02-09 ENCOUNTER — Other Ambulatory Visit: Payer: Self-pay | Admitting: Physical Medicine & Rehabilitation

## 2018-02-11 NOTE — Telephone Encounter (Signed)
Recieved electronic medication refill request for tramadol, no mention of this medication in previous notes, unsure if ok to refill, please advise.

## 2018-04-01 ENCOUNTER — Ambulatory Visit: Payer: Medicare Other | Admitting: Physical Medicine & Rehabilitation

## 2018-04-01 ENCOUNTER — Encounter: Payer: Self-pay | Admitting: Physical Medicine & Rehabilitation

## 2018-04-01 ENCOUNTER — Other Ambulatory Visit: Payer: Self-pay

## 2018-04-01 ENCOUNTER — Encounter: Payer: Medicare Other | Attending: Physical Medicine & Rehabilitation

## 2018-04-01 VITALS — BP 128/84 | HR 81 | Ht 65.0 in | Wt 230.0 lb

## 2018-04-01 DIAGNOSIS — I10 Essential (primary) hypertension: Secondary | ICD-10-CM | POA: Insufficient documentation

## 2018-04-01 DIAGNOSIS — F319 Bipolar disorder, unspecified: Secondary | ICD-10-CM | POA: Diagnosis not present

## 2018-04-01 DIAGNOSIS — J449 Chronic obstructive pulmonary disease, unspecified: Secondary | ICD-10-CM | POA: Diagnosis not present

## 2018-04-01 DIAGNOSIS — M47816 Spondylosis without myelopathy or radiculopathy, lumbar region: Secondary | ICD-10-CM | POA: Diagnosis not present

## 2018-04-01 DIAGNOSIS — K2270 Barrett's esophagus without dysplasia: Secondary | ICD-10-CM | POA: Diagnosis not present

## 2018-04-01 DIAGNOSIS — Z87891 Personal history of nicotine dependence: Secondary | ICD-10-CM | POA: Insufficient documentation

## 2018-04-01 DIAGNOSIS — F419 Anxiety disorder, unspecified: Secondary | ICD-10-CM | POA: Diagnosis not present

## 2018-04-01 DIAGNOSIS — E7800 Pure hypercholesterolemia, unspecified: Secondary | ICD-10-CM | POA: Insufficient documentation

## 2018-04-01 DIAGNOSIS — K219 Gastro-esophageal reflux disease without esophagitis: Secondary | ICD-10-CM | POA: Insufficient documentation

## 2018-04-01 DIAGNOSIS — M25562 Pain in left knee: Secondary | ICD-10-CM | POA: Insufficient documentation

## 2018-04-01 DIAGNOSIS — M47817 Spondylosis without myelopathy or radiculopathy, lumbosacral region: Secondary | ICD-10-CM | POA: Diagnosis not present

## 2018-04-01 NOTE — Patient Instructions (Signed)

## 2018-04-01 NOTE — Progress Notes (Signed)
Subjective:    Patient ID: Katelyn Thompson, female    DOB: 08/14/1965, 52 y.o.   MRN: [id number]  HPI Pain increased at night or with walking Pt states she does all of her own housework although grand children oc assist,  She ambulates without a device although she states she has both canes and walkers at home.   Pt states that she has pain across the upper buttocks area.  Pain worsens with bending No falls or new injuries  Pain Inventory Average Pain 7 Pain Right Now 7 My pain is burning, dull, stabbing, tingling and aching  In the last 24 hours, has pain interfered with the following? General activity 2 Relation with others 3 Enjoyment of life 2 What TIME of day is your pain at its worst? night Sleep (in general) Poor  Pain is worse with: walking, bending, standing and some activites Pain improves with: medication Relief from Meds: 2  Mobility walk with assistance use a cane how many minutes can you walk? 10 ability to climb steps?  yes do you drive?  yes  Function Do you have any goals in this area?  no  Neuro/Psych No problems in this area  Prior Studies Any changes since last visit?  yes x-rays  Physicians involved in your care Any changes since last visit?  no   Family History  Problem Relation Age of Onset  . Heart disease Mother   . Diabetes Mother   . Asthma Mother   . Lung disease Mother   . Arthritis Mother   . Allergies Mother   . Heart attack Brother 48       deceased  . Colon cancer Neg Hx    Social History   Socioeconomic History  . Marital status: Married    Spouse name: Not on file  . Number of children: Not on file  . Years of education: 9th grade  . Highest education level: Not on file  Occupational History  . Occupation: disabled    Associate Professor: NOT EMPLOYED  Social Needs  . Financial resource strain: Not on file  . Food insecurity:    Worry: Not on file    Inability: Not on file  . Transportation needs:    Medical: Not on  file    Non-medical: Not on file  Tobacco Use  . Smoking status: Former Smoker    Packs/day: 2.00    Years: 34.00    Pack years: 68.00    Types: Cigarettes    Last attempt to quit: 12/10/2015    Years since quitting: 2.3  . Smokeless tobacco: Never Used  . Tobacco comment: up to 3 ppd  Substance and Sexual Activity  . Alcohol use: No    Comment: rarely  . Drug use: No  . Sexual activity: Yes    Birth control/protection: Surgical  Lifestyle  . Physical activity:    Days per week: Not on file    Minutes per session: Not on file  . Stress: Not on file  Relationships  . Social connections:    Talks on phone: Not on file    Gets together: Not on file    Attends religious service: Not on file    Active member of club or organization: Not on file    Attends meetings of clubs or organizations: Not on file    Relationship status: Not on file  Other Topics Concern  . Not on file  Social History Narrative  . Not on file  Past Surgical History:  Procedure Laterality Date  . ABDOMINAL HYSTERECTOMY    . ANKLE ARTHROSCOPY WITH DRILLING/MICROFRACTURE  02/26/2012   Procedure: ANKLE ARTHROSCOPY WITH DRILLING/MICROFRACTURE;  Surgeon: Vickki HearingStanley E Harrison, MD;  Location: [facility];  Service: Orthopedics;  Laterality: Right;  . COLONOSCOPY  11/20/2011   RMR: Friable anorectum; single anal canal hemorrhoidal tag. Otherwise normal rectum and colon  . ESOPHAGOGASTRODUODENOSCOPY  06/2010   diagnosed with Barrett's, small hh, esophagus dilated. Next EGD 06/2011  . ESOPHAGOGASTRODUODENOSCOPY  Aug 2013   RMR: abnormal distal esophagus consistent with prior dx of SS Barrett;s. s/p dilation. small hiatal hernia. Biopsies: no metaplasia, dysplasia, malignancy. SURVEILLANCE AUG 2016  . EXAM UNDER ANESTHESIA WITH MANIPULATION OF KNEE Right 09/16/2012   Procedure: EXAM UNDER ANESTHESIA WITH MANIPULATION OF RIGHT KNEE;  Surgeon: Vickki HearingStanley E Harrison, MD;  Location: [facility];  Service: Orthopedics;  Laterality: Right;    . FOOT SURGERY     x 2  . KNEE ARTHROSCOPY  02/26/2012   Procedure: ARTHROSCOPY KNEE;  Surgeon: Vickki HearingStanley E Harrison, MD;  Location: [facility];  Service: Orthopedics;  Laterality: Right;  with three chondroplasties  . KNEE ARTHROTOMY Left 11/21/2013   Procedure: KNEE ARTHROSCOPY  WITH MEDIAL, LATERAL, AND PATELLA  FEMORAL CHONDROPLASTY;  Surgeon: Vickki HearingStanley E Harrison, MD;  Location: [facility];  Service: Orthopedics;  Laterality: Left;  . s/p hysterectomy    . SINUS SURGERY WITH INSTATRAK    . SURGERY OF LIP     biopsy  . TOTAL KNEE ARTHROPLASTY Right 07/01/2012   Procedure: TOTAL KNEE ARTHROPLASTY;  Surgeon: Vickki HearingStanley E Harrison, MD;  Location: [facility];  Service: Orthopedics;  Laterality: Right;  . TUBAL LIGATION     Past Medical History:  Diagnosis Date  . Anxiety   . Anxiety and depression    Does not like being around lots of people  . Arthritis   . Barrett's esophagus without dysplasia   . Bipolar 1 disorder (HCC)   . COPD (chronic obstructive pulmonary disease) (HCC)   . Depression   . GERD (gastroesophageal reflux disease)   . HTN (hypertension)   . Hypercholesteremia   . MS (multiple sclerosis) (HCC)    but not offcially disgnosed  . Nerve damage    Severe to left foot  . OA (osteoarthritis) of knee 01/31/2012  . PONV (postoperative nausea and vomiting)    BP 128/84   Pulse 81   Ht 5\' 5"  (1.651 m)   Wt 230 lb (104.3 kg)   SpO2 92%   BMI 38.27 kg/m   Opioid Risk Score:   Fall Risk Score:  `1  Depression screen PHQ 2/9  [facility] 04/01/2018 02/19/2017 02/11/2016  Decreased Interest 1 1 3   Down, Depressed, Hopeless 1 1 3   PHQ - 2 Score 2 2 6   Altered sleeping - - 2  Tired, decreased energy - - 2  Change in appetite - - 2  Feeling bad or failure about yourself  - - 3  Trouble concentrating - - 2  Moving slowly or fidgety/restless - - 2  Suicidal thoughts - - 1  PHQ-9 Score - - 20  Some recent data might be hidden    Review of Systems   Constitutional: Negative.   HENT: Negative.   Eyes: Negative.   Respiratory: Negative.   Cardiovascular: Negative.   Gastrointestinal: Negative.   Endocrine: Negative.   Genitourinary: Negative.   Musculoskeletal: Positive for back pain.  Allergic/Immunologic: Negative.   Neurological: Negative.   Hematological: Negative.  Psychiatric/Behavioral: Negative.   All other systems reviewed and are negative.      Objective:   Physical Exam Vitals signs and nursing note reviewed.  Constitutional:      Appearance: Normal appearance.  HENT:     Head: Normocephalic.     Nose: Nose normal.     Mouth/Throat:     Mouth: Mucous membranes are moist.  Eyes:     General: No scleral icterus.    Extraocular Movements: Extraocular movements intact.     Pupils: Pupils are equal, round, and reactive to light.  Musculoskeletal:     Comments: Motor strength is 5/5 bilateral hip flexion knee extensor ankle dorsiflexor Patient has pain behind the left knee with knee extension and ankle dorsiflexion.  She has no tenderness over the posterior fossa or over the hamstring tendons. Lumbar spine range of motion is 75% of normal with flexion extension lateral bending and rotation she is pain with all movement directions. She ambulates without assistive device no evidence of toe drag or knee instability.  Skin:    General: Skin is warm and dry.  Neurological:     Mental Status: She is alert and oriented to person, place, and time.  Psychiatric:        Mood and Affect: Mood normal.        Behavior: Behavior normal.        Thought Content: Thought content normal.           Assessment & Plan:  1.  Lumbar spondylosis she does have some persistent pain after radiofrequency neurotomy which is probably related to some element of lumbar disc degeneration.  We will repeat L3-L4 medial branch L5 dorsal ramus radiofrequency neurotomy on the right side at the 64-month mark i.e. end of February  Last UDS  12/03/2017 chronic alprazolam prescription by primary care no change in dose Continue tramadol 50 mg 3 times daily, she has refills that should last until at least April or May

## 2018-06-03 ENCOUNTER — Ambulatory Visit: Payer: Medicare Other | Admitting: Physical Medicine & Rehabilitation

## 2018-07-01 ENCOUNTER — Other Ambulatory Visit: Payer: Self-pay | Admitting: Physical Medicine & Rehabilitation

## 2018-07-01 ENCOUNTER — Ambulatory Visit: Payer: Medicare Other | Admitting: Physical Medicine & Rehabilitation

## 2018-07-01 ENCOUNTER — Encounter: Payer: Medicare Other | Attending: Physical Medicine & Rehabilitation

## 2018-07-01 ENCOUNTER — Other Ambulatory Visit: Payer: Self-pay

## 2018-07-01 VITALS — BP 128/85 | HR 71 | Wt 235.0 lb

## 2018-07-01 DIAGNOSIS — M47817 Spondylosis without myelopathy or radiculopathy, lumbosacral region: Secondary | ICD-10-CM | POA: Diagnosis present

## 2018-07-01 MED ORDER — TRAMADOL HCL 50 MG PO TABS: 50.0000 mg | ORAL_TABLET | Freq: Three times a day (TID) | ORAL | 5 refills | Status: DC | PRN

## 2018-07-01 NOTE — Patient Instructions (Signed)
You had a radio frequency procedure today This was done to alleviate joint pain in your lumbar area We injected lidocaine which is a local anesthetic.  You may experience soreness at the injection sites. You may also experienced some irritation of the nerves that were heated I'm recommending ice for 30 minutes every 2 hours as needed for the next 24-48 hours   

## 2018-07-01 NOTE — Progress Notes (Signed)

## 2018-07-01 NOTE — Progress Notes (Signed)
  PROCEDURE RECORD [facility]   Name: Katelyn Thompson DOB:12-Jun-1965 MRN: [id number]  Date:07/01/2018  Physician: Claudette Laws, MD    Nurse/CMA: Nedra Hai, CMA Allergies:  Allergies  Allergen Reactions  . Aspirin Other (See Comments)    esophagus disease heartburn  . Nabumetone Nausea And Vomiting and Other (See Comments)  . Nitrofurantoin Nausea And Vomiting  . Red Dye Rash    Kdc:red Dye+yellow Dye+nitrofurantoin+brilliant Blue Fcf    Consent Signed: Yes.    Is patient diabetic? No.  CBG today?  Pregnant: No. LMP: No LMP recorded. Patient has had a hysterectomy. (age 73-55)  Anticoagulants: no Anti-inflammatory: no Antibiotics: no  Procedure:Right L 3,4,5 Radiofrequency Position: Prone Start Time: 2:15pm End Time: 2:28pm Fluoro Time: 52  RN/CMA Nedra Hai, CMA Brayant Dorr, CMA    Time 2:00pm 2:30pm    BP 128/85 120/84    Pulse 71 100    Respirations 14 14    O2 Sat 95 95    S/S 6 6    Pain Level 8/10 8/10     D/C home with sister, patient A & O X 3, D/C instructions reviewed, and sits independently.

## 2018-07-03 ENCOUNTER — Other Ambulatory Visit: Payer: Self-pay

## 2018-09-05 ENCOUNTER — Other Ambulatory Visit: Payer: Self-pay

## 2018-09-05 ENCOUNTER — Encounter: Payer: Medicare Other | Attending: Physical Medicine & Rehabilitation | Admitting: Physical Medicine & Rehabilitation

## 2018-09-05 VITALS — BP 112/62 | HR 77 | Temp 98.5°F | Wt 238.2 lb

## 2018-09-05 DIAGNOSIS — M47817 Spondylosis without myelopathy or radiculopathy, lumbosacral region: Secondary | ICD-10-CM | POA: Diagnosis not present

## 2018-09-05 MED ORDER — TIZANIDINE HCL 4 MG PO TABS: ORAL_TABLET | ORAL | 2 refills | Status: DC

## 2018-09-05 NOTE — Progress Notes (Signed)
  PROCEDURE RECORD [facility]   Name: Katelyn Thompson DOB:26-May-1965 MRN: [id number]  Date:09/05/2018  Physician: Claudette Laws, MD    Nurse/CMA: Nedra Hai, CMA  Allergies:  Allergies  Allergen Reactions  . Aspirin Other (See Comments)    esophagus disease heartburn  . Nabumetone Nausea And Vomiting and Other (See Comments)  . Nitrofurantoin Nausea And Vomiting  . Red Dye Rash    Kdc:red Dye+yellow Dye+nitrofurantoin+brilliant Blue Fcf    Consent Signed: Yes.    Is patient diabetic? No.  CBG today?   Pregnant: No. LMP: No LMP recorded. Patient has had a hysterectomy. (age 8-55)  Anticoagulants: no Anti-inflammatory: no Antibiotics: no  Procedure: Left L3-4-5 Radiofrequency Position: Prone Start Time: 2:02pm End Time: 2:24pm Fluoro Time: 33  RN/CMA Nedra Hai, CMA [name], CMA    Time 1:50pm 2:26pm    BP 112/62 141/85    Pulse 77 74    Respirations 16 16    O2 Sat 96 96    S/S 6/6 6/6    Pain Level 8/10 8/10     D/C home with sister, patient A & O X 3, D/C instructions reviewed, and sits independently.

## 2018-09-05 NOTE — Progress Notes (Signed)
Left L5 dorsal ramus., left L4 and left L3 medial branch radio frequency neurotomy under fluoroscopic guidance  Indication: Low back pain due to lumbar spondylosis which has been relieved on 2 occasions by greater than 50% by lumbar medial branch blocks at corresponding levels. Last Left L5 dorsal ramus., left L4 and left L3 medial branch radio frequency neurotomy under fluoroscopic guidance performed in August 2019 lasted >64mo  Informed consent was obtained after describing risks and benefits of the procedure with the patient, this includes bleeding, bruising, infection, paralysis and medication side effects. The patient wishes to proceed and has given written consent. The patient was placed in a prone position. The lumbar and sacral area was marked and prepped with Betadine. A 25-gauge 1-1/2 inch needle was inserted into the skin and subcutaneous tissue at 3 sites in one ML of 1% lidocaine was injected into each site. Then a 18-gauge 10 cm radio frequency needle with a 1 cm curved active tip was inserted targeting the left S1 SAP/sacral ala junction. Bone contact was made and confirmed with lateral imaging.  motor stimulation at 2 Hz confirm proper needle location followed by injection of one ML of 1% MPF lidocaine. Then the left L5 SAP/transverse process junction was targeted. Bone contact was made and confirmed with lateral imaging motor stimulation at 2 Hz confirm proper needle location followed by injection of one ML of the solution containing one ML of  1% MPF lidocaine. Then the left L4 SAP/transverse process junction was targeted. Bone contact was made and confirmed with lateral imaging. motor stimulation at 2 Hz confirm proper needle location followed by injection of one ML of the solution containing one ML of1% MPF lidocaine. Radio frequency lesion  at [facility] for 90 seconds was performed. Needles were removed. Post procedure instructions and vital signs were performed. Patient tolerated procedure well.  Followup appointment was given.

## 2018-09-05 NOTE — Patient Instructions (Signed)

## 2018-12-06 ENCOUNTER — Other Ambulatory Visit: Payer: Self-pay

## 2018-12-06 ENCOUNTER — Encounter: Payer: Medicare Other | Attending: Physical Medicine & Rehabilitation | Admitting: Physical Medicine & Rehabilitation

## 2018-12-06 VITALS — BP 139/89 | HR 84 | Temp 98.2°F | Ht 64.0 in | Wt 238.0 lb

## 2018-12-06 DIAGNOSIS — G894 Chronic pain syndrome: Secondary | ICD-10-CM | POA: Diagnosis not present

## 2018-12-06 DIAGNOSIS — Z79891 Long term (current) use of opiate analgesic: Secondary | ICD-10-CM | POA: Insufficient documentation

## 2018-12-06 DIAGNOSIS — Z5181 Encounter for therapeutic drug level monitoring: Secondary | ICD-10-CM | POA: Diagnosis not present

## 2018-12-06 MED ORDER — TRAMADOL HCL 50 MG PO TABS: 50.0000 mg | ORAL_TABLET | Freq: Three times a day (TID) | ORAL | 5 refills | Status: DC | PRN

## 2018-12-06 MED ORDER — TIZANIDINE HCL 4 MG PO TABS: ORAL_TABLET | ORAL | 2 refills | Status: DC

## 2018-12-06 NOTE — Progress Notes (Signed)
Subjective:    Patient ID: Katelyn Thompson, female    DOB: 04-11-1965, 53 y.o.   MRN: [id number] 3/23/2020RightL5 dorsal ramus., Right L4 and Right L3 medial branch radio frequency neurotomy under fluoroscopic guidance  09/05/2018 Left L5 dorsal ramus., left L4 and left L3 medial branch radio frequency neurotomy under fluoroscopic guidance HPI  Back pain intermittent , perhaps more often on RIght side  No sciatic pain No recent falls Independent with all ADLs, cooks for family, cleans and mops Pain Inventory Average Pain 7 Pain Right Now 7 My pain is constant, sharp, stabbing, tingling and aching  In the last 24 hours, has pain interfered with the following? General activity 6 Relation with others 3 Enjoyment of life 9 What TIME of day is your pain at its worst? evening and night Sleep (in general) Poor  Pain is worse with: walking, bending and standing Pain improves with: medication and injections Relief from Meds: 4  Mobility use a cane use a walker ability to climb steps?  yes do you drive?  yes  Function disabled: date disabled na I need assistance with the following:  household duties and shopping  Neuro/Psych bladder control problems bowel control problems weakness numbness tingling trouble walking spasms dizziness depression anxiety  Prior Studies Any changes since last visit?  no  Physicians involved in your care Any changes since last visit?  no   Family History  Problem Relation Age of Onset  . Heart disease Mother   . Diabetes Mother   . Asthma Mother   . Lung disease Mother   . Arthritis Mother   . Allergies Mother   . Heart attack Brother 48       deceased  . Colon cancer Neg Hx    Social History   Socioeconomic History  . Marital status: Married    Spouse name: Not on file  . Number of children: Not on file  . Years of education: 9th grade  . Highest education level: Not on file  Occupational History  . Occupation: disabled   Associate Professor: NOT EMPLOYED  Social Needs  . Financial resource strain: Not on file  . Food insecurity    Worry: Not on file    Inability: Not on file  . Transportation needs    Medical: Not on file    Non-medical: Not on file  Tobacco Use  . Smoking status: Former Smoker    Packs/day: 2.00    Years: 34.00    Pack years: 68.00    Types: Cigarettes    Quit date: 12/10/2015    Years since quitting: 2.9  . Smokeless tobacco: Never Used  . Tobacco comment: up to 3 ppd  Substance and Sexual Activity  . Alcohol use: No    Comment: rarely  . Drug use: No  . Sexual activity: Yes    Birth control/protection: Surgical  Lifestyle  . Physical activity    Days per week: Not on file    Minutes per session: Not on file  . Stress: Not on file  Relationships  . Social Musician on phone: Not on file    Gets together: Not on file    Attends religious service: Not on file    Active member of club or organization: Not on file    Attends meetings of clubs or organizations: Not on file    Relationship status: Not on file  Other Topics Concern  . Not on file  Social History Narrative  .  Not on file   Past Surgical History:  Procedure Laterality Date  . ABDOMINAL HYSTERECTOMY    . ANKLE ARTHROSCOPY WITH DRILLING/MICROFRACTURE  02/26/2012   Procedure: ANKLE ARTHROSCOPY WITH DRILLING/MICROFRACTURE;  Surgeon: Carole Civil, MD;  Location: [facility];  Service: Orthopedics;  Laterality: Right;  . COLONOSCOPY  11/20/2011   RMR: Friable anorectum; single anal canal hemorrhoidal tag. Otherwise normal rectum and colon  . ESOPHAGOGASTRODUODENOSCOPY  06/2010   diagnosed with Barrett's, small hh, esophagus dilated. Next EGD 06/2011  . ESOPHAGOGASTRODUODENOSCOPY  Aug 2013   RMR: abnormal distal esophagus consistent with prior dx of SS Barrett;s. s/p dilation. small hiatal hernia. Biopsies: no metaplasia, dysplasia, malignancy. SURVEILLANCE AUG 2016  . EXAM UNDER ANESTHESIA WITH MANIPULATION OF  KNEE Right 09/16/2012   Procedure: EXAM UNDER ANESTHESIA WITH MANIPULATION OF RIGHT KNEE;  Surgeon: Carole Civil, MD;  Location: [facility];  Service: Orthopedics;  Laterality: Right;  . FOOT SURGERY     x 2  . KNEE ARTHROSCOPY  02/26/2012   Procedure: ARTHROSCOPY KNEE;  Surgeon: Carole Civil, MD;  Location: [facility];  Service: Orthopedics;  Laterality: Right;  with three chondroplasties  . KNEE ARTHROTOMY Left 11/21/2013   Procedure: KNEE ARTHROSCOPY  WITH MEDIAL, LATERAL, AND PATELLA  FEMORAL CHONDROPLASTY;  Surgeon: Carole Civil, MD;  Location: [facility];  Service: Orthopedics;  Laterality: Left;  . s/p hysterectomy    . SINUS SURGERY WITH INSTATRAK    . SURGERY OF LIP     biopsy  . TOTAL KNEE ARTHROPLASTY Right 07/01/2012   Procedure: TOTAL KNEE ARTHROPLASTY;  Surgeon: Carole Civil, MD;  Location: [facility];  Service: Orthopedics;  Laterality: Right;  . TUBAL LIGATION     Past Medical History:  Diagnosis Date  . Anxiety   . Anxiety and depression    Does not like being around lots of people  . Arthritis   . Barrett's esophagus without dysplasia   . Bipolar 1 disorder ([place])   . COPD (chronic obstructive pulmonary disease) ([place])   . Depression   . GERD (gastroesophageal reflux disease)   . HTN (hypertension)   . Hypercholesteremia   . MS (multiple sclerosis) ([place])    but not offcially disgnosed  . Nerve damage    Severe to left foot  . OA (osteoarthritis) of knee 01/31/2012  . PONV (postoperative nausea and vomiting)    BP 139/89   Pulse 84   Temp 98.2 F (36.8 C)   Ht 5\' 4"  (1.626 m)   Wt 238 lb (108 kg)   SpO2 94%   BMI 40.85 kg/m   Opioid Risk Score:   Fall Risk Score:  `1  Depression screen PHQ 2/9  Depression screen [facility] 2/9 04/01/2018 02/19/2017 02/11/2016  Decreased Interest 1 1 3   Down, Depressed, Hopeless 1 1 3   PHQ - 2 Score 2 2 6   Altered sleeping - - 2  Tired, decreased energy - - 2  Change in appetite - - 2  Feeling bad or failure about  yourself  - - 3  Trouble concentrating - - 2  Moving slowly or fidgety/restless - - 2  Suicidal thoughts - - 1  PHQ-9 Score - - 20  Some recent data might be hidden    Review of Systems  Constitutional: Positive for diaphoresis, fever and unexpected weight change.  HENT: Negative.   Eyes: Negative.   Respiratory: Negative.   Cardiovascular: Negative.   Gastrointestinal: Negative.   Endocrine: Negative.   Genitourinary: Negative.  Musculoskeletal: Positive for gait problem.       Spasms  Skin: Negative.   Allergic/Immunologic: Negative.   Neurological: Positive for dizziness, tremors, weakness and numbness.       Tingling  Psychiatric/Behavioral: Positive for dysphoric mood. The patient is nervous/anxious.   All other systems reviewed and are negative.      Objective:   Physical Exam Vitals signs reviewed.  Constitutional:      Appearance: Normal appearance.  Eyes:     Extraocular Movements: Extraocular movements intact.     Conjunctiva/sclera: Conjunctivae normal.     Pupils: Pupils are equal, round, and reactive to light.  Musculoskeletal:     Comments: No tenderness to palpation in the lumbar spine, no deformities  Pt with 50% ROM with lumbar flexion, ext, lateral bending and rotation- right lateral bending is most painful   Neg SLR  Skin:    General: Skin is warm and dry.  Neurological:     General: No focal deficit present.     Mental Status: She is alert and oriented to person, place, and time.     Comments: Normal strength in LEs GAIT WITHOUT EVIDENCE OF TOE DRAG OR KNEE INSTABILITY  Psychiatric:        Mood and Affect: Mood normal.     Right lat bending with lower lumbar pain      Assessment & Plan:  1.  Lumbar spondylosis without myelopathy overall good response to L3-4-5 RF, she is starting to have some symptoms on the right side but only with right lateral bending.  At this point I think we will hold off on scheduling a repeat RF.  She will call  if her pain becomes more persistent. Continue tramadol 50 mg 3 times daily Will need UDS today, PDMP reviewed appropriate, she is on chronic stable dose of alprazolam 1 mg 3 times daily prescribed by primary care.  She does state her new primary care physician is trying to simplify her medication regimen. Follow-up visit in 6 months Patient to call to schedule repeat right-sided L3-L4 medial branch and right L5 dorsal ramus radiofrequency neurotomy

## 2018-12-06 NOTE — Patient Instructions (Signed)
Please call if Right sided back pain increases.  We'll need to schedule repeat  RIght  medial branch radio frequency neurotomy under fluoroscopic guidance

## 2018-12-10 LAB — TOXASSURE SELECT,+ANTIDEPR,UR

## 2018-12-13 ENCOUNTER — Telehealth: Payer: Self-pay | Admitting: *Deleted

## 2018-12-13 NOTE — Telephone Encounter (Signed)
Urine drug screen for this encounter is consistent for prescribed medication 

## 2018-12-24 ENCOUNTER — Other Ambulatory Visit: Payer: Self-pay

## 2018-12-24 DIAGNOSIS — Z20822 Contact with and (suspected) exposure to covid-19: Secondary | ICD-10-CM

## 2018-12-26 LAB — NOVEL CORONAVIRUS, NAA: SARS-CoV-2, NAA: NOT DETECTED

## 2019-03-28 ENCOUNTER — Other Ambulatory Visit: Payer: Self-pay | Admitting: Physical Medicine & Rehabilitation

## 2019-05-23 ENCOUNTER — Encounter: Payer: Self-pay | Admitting: Physical Medicine & Rehabilitation

## 2019-06-02 ENCOUNTER — Ambulatory Visit: Payer: Medicare Other | Admitting: Physical Medicine & Rehabilitation

## 2019-06-06 ENCOUNTER — Encounter: Payer: Self-pay | Admitting: Physical Medicine & Rehabilitation

## 2019-06-06 ENCOUNTER — Other Ambulatory Visit: Payer: Self-pay

## 2019-06-06 ENCOUNTER — Encounter: Payer: Medicare Other | Attending: Physical Medicine & Rehabilitation | Admitting: Physical Medicine & Rehabilitation

## 2019-06-06 VITALS — BP 158/100 | HR 86 | Temp 97.7°F | Ht 65.0 in | Wt 245.0 lb

## 2019-06-06 DIAGNOSIS — M47817 Spondylosis without myelopathy or radiculopathy, lumbosacral region: Secondary | ICD-10-CM | POA: Diagnosis not present

## 2019-06-06 MED ORDER — TRAMADOL HCL 50 MG PO TABS: 50.0000 mg | ORAL_TABLET | Freq: Three times a day (TID) | ORAL | 5 refills | Status: DC | PRN

## 2019-06-06 NOTE — Progress Notes (Signed)
Subjective:     Patient ID: Katelyn Thompson, female   DOB: 05/23/1965, 54 y.o.   MRN: [id number] 3/23/2020RightL5 dorsal ramus., Right L4 and Right L3 medial branch radio frequency neurotomy under fluoroscopic guidance  09/05/2018 Left L5 dorsal ramus., left L4 and left L3 medial branch radio frequency neurotomy under fluoroscopic guidance HPI 54 year old female with lumbar spondylosis without myelopathy who has complained over the last several months of increasing right-sided low back pain.  She indicates that the pain is right at the waistline. She has pain when she extends her but not when she flexes her back. She has no pain running down her legs.  She has no numbness or tingling in the legs.  No bowel or bladder dysfunction.  She has milder pain on the left side than on the right side. Pain Inventory Average Pain 9 Pain Right Now 7 My pain is constant, sharp, dull and aching  In the last 24 hours, has pain interfered with the following? General activity 7 Relation with others 5 Enjoyment of life 9 What TIME of day is your pain at its worst? all Sleep (in general) Poor  Pain is worse with: walking, bending, standing and some activites Pain improves with: medication Relief from Meds: 6  Mobility walk with assistance use a cane how many minutes can you walk? 15 ability to climb steps?  yes do you drive?  yes  Function disabled: date disabled . I need assistance with the following:  meal prep, household duties and shopping  Neuro/Psych weakness numbness tingling trouble walking spasms dizziness depression anxiety  Prior Studies Any changes since last visit?  no  Physicians involved in your care Any changes since last visit?  no   Family History  Problem Relation Age of Onset  . Heart disease Mother   . Diabetes Mother   . Asthma Mother   . Lung disease Mother   . Arthritis Mother   . Allergies Mother   . Heart attack Brother 48       deceased  . Colon  cancer Neg Hx    Social History   Socioeconomic History  . Marital status: Married    Spouse name: Not on file  . Number of children: Not on file  . Years of education: 9th grade  . Highest education level: Not on file  Occupational History  . Occupation: disabled    Associate Professor: NOT EMPLOYED  Tobacco Use  . Smoking status: Former Smoker    Packs/day: 2.00    Years: 34.00    Pack years: 68.00    Types: Cigarettes    Quit date: 12/10/2015    Years since quitting: 3.4  . Smokeless tobacco: Never Used  . Tobacco comment: up to 3 ppd  Substance and Sexual Activity  . Alcohol use: No    Comment: rarely  . Drug use: No  . Sexual activity: Yes    Birth control/protection: Surgical  Other Topics Concern  . Not on file  Social History Narrative  . Not on file   Social Determinants of Health   Financial Resource Strain:   . Difficulty of Paying Living Expenses: Not on file  Food Insecurity:   . Worried About Programme researcher, broadcasting/film/video in the Last Year: Not on file  . Ran Out of Food in the Last Year: Not on file  Transportation Needs:   . Lack of Transportation (Medical): Not on file  . Lack of Transportation (Non-Medical): Not on file  Physical Activity:   .  Days of Exercise per Week: Not on file  . Minutes of Exercise per Session: Not on file  Stress:   . Feeling of Stress : Not on file  Social Connections:   . Frequency of Communication with Friends and Family: Not on file  . Frequency of Social Gatherings with Friends and Family: Not on file  . Attends Religious Services: Not on file  . Active Member of Clubs or Organizations: Not on file  . Attends Banker Meetings: Not on file  . Marital Status: Not on file   Past Surgical History:  Procedure Laterality Date  . ABDOMINAL HYSTERECTOMY    . ANKLE ARTHROSCOPY WITH DRILLING/MICROFRACTURE  02/26/2012   Procedure: ANKLE ARTHROSCOPY WITH DRILLING/MICROFRACTURE;  Surgeon: Vickki Hearing, MD;  Location: [facility];   Service: Orthopedics;  Laterality: Right;  . COLONOSCOPY  11/20/2011   RMR: Friable anorectum; single anal canal hemorrhoidal tag. Otherwise normal rectum and colon  . ESOPHAGOGASTRODUODENOSCOPY  06/2010   diagnosed with Barrett's, small hh, esophagus dilated. Next EGD 06/2011  . ESOPHAGOGASTRODUODENOSCOPY  Aug 2013   RMR: abnormal distal esophagus consistent with prior dx of SS Barrett;s. s/p dilation. small hiatal hernia. Biopsies: no metaplasia, dysplasia, malignancy. SURVEILLANCE AUG 2016  . EXAM UNDER ANESTHESIA WITH MANIPULATION OF KNEE Right 09/16/2012   Procedure: EXAM UNDER ANESTHESIA WITH MANIPULATION OF RIGHT KNEE;  Surgeon: Vickki Hearing, MD;  Location: [facility];  Service: Orthopedics;  Laterality: Right;  . FOOT SURGERY     x 2  . KNEE ARTHROSCOPY  02/26/2012   Procedure: ARTHROSCOPY KNEE;  Surgeon: Vickki Hearing, MD;  Location: [facility];  Service: Orthopedics;  Laterality: Right;  with three chondroplasties  . KNEE ARTHROTOMY Left 11/21/2013   Procedure: KNEE ARTHROSCOPY  WITH MEDIAL, LATERAL, AND PATELLA  FEMORAL CHONDROPLASTY;  Surgeon: Vickki Hearing, MD;  Location: [facility];  Service: Orthopedics;  Laterality: Left;  . s/p hysterectomy    . SINUS SURGERY WITH INSTATRAK    . SURGERY OF LIP     biopsy  . TOTAL KNEE ARTHROPLASTY Right 07/01/2012   Procedure: TOTAL KNEE ARTHROPLASTY;  Surgeon: Vickki Hearing, MD;  Location: [facility];  Service: Orthopedics;  Laterality: Right;  . TUBAL LIGATION     Past Medical History:  Diagnosis Date  . Anxiety   . Anxiety and depression    Does not like being around lots of people  . Arthritis   . Barrett's esophagus without dysplasia   . Bipolar 1 disorder ([facility])   . COPD (chronic obstructive pulmonary disease) ([facility])   . Depression   . GERD (gastroesophageal reflux disease)   . HTN (hypertension)   . Hypercholesteremia   . MS (multiple sclerosis) ([facility])    but not offcially disgnosed  . Nerve damage    Severe to left foot  .  OA (osteoarthritis) of knee 01/31/2012  . PONV (postoperative nausea and vomiting)    BP (!) 158/100   Pulse 86   Temp 97.7 F (36.5 C)   Ht 5\' 5"  (1.651 m)   Wt 245 lb (111.1 kg)   SpO2 94%   BMI 40.77 kg/m   Opioid Risk Score:   Fall Risk Score:  `1  Depression screen PHQ 2/9  Depression screen [facility] 2/9 04/01/2018 02/19/2017 02/11/2016  Decreased Interest 1 1 3   Down, Depressed, Hopeless 1 1 3   PHQ - 2 Score 2 2 6   Altered sleeping - - 2  Tired, decreased energy - - 2  Change  in appetite - - 2  Feeling bad or failure about yourself  - - 3  Trouble concentrating - - 2  Moving slowly or fidgety/restless - - 2  Suicidal thoughts - - 1  PHQ-9 Score - - 20  Some recent data might be hidden    Review of Systems  Constitutional: Negative.   HENT: Negative.   Eyes: Negative.   Respiratory: Negative.   Genitourinary: Negative.   Musculoskeletal: Positive for arthralgias, back pain and gait problem.       Spasms   Skin: Negative.   Allergic/Immunologic: Negative.   Neurological: Positive for dizziness, weakness and numbness.       Tingling  Psychiatric/Behavioral: Positive for dysphoric mood. The patient is nervous/anxious.   All other systems reviewed and are negative.      Objective:   Physical Exam Vitals and nursing note reviewed.  Constitutional:      Appearance: Normal appearance. She is obese.  HENT:     Head: Normocephalic and atraumatic.  Eyes:     Extraocular Movements: Extraocular movements intact.     Conjunctiva/sclera: Conjunctivae normal.     Pupils: Pupils are equal, round, and reactive to light.  Musculoskeletal:     Comments: There is tenderness palpation at the right L4 and right L5 paraspinals.  No tenderness over the right PSIS There is pain with right lateral bending as well as lumbar extension no pain with left lateral bending or lumbar flexion Negative straight leg raising test Deep tendon reflexes are difficult to elicit bilaterally at  the knees and ankles  Skin:    General: Skin is warm and dry.  Neurological:     General: No focal deficit present.     Mental Status: She is alert and oriented to person, place, and time.     Comments: Motor strength is 5/5 bilateral hip flexor knee extensor ankle dorsiflexor  Psychiatric:        Mood and Affect: Mood normal.        Behavior: Behavior normal.        Assessment:     Lumbar spondylosis without myelopathy, previously had very good response for 9 to 10 months following a right L3-L4 medial branch and right L5 dorsal ramus radiofrequency neurotomy last performed in March 2020. The patient has had previous left L3-L4 medial branch and left L5 dorsal ramus radiofrequency neurotomy in May 2020.  This still seems to be effective    Plan:     Repeat right L3-L4 medial branch and right L5 dorsal ramus radiofrequency next month   Would expect that the patient will need left-sided intervention repeated in approximately 2 to 3 months  Continue tramadol 50mg  3 times daily repeat UDS next visit PDMP reviewed appropriate she is on chronic benzodiazepines prescribed by her primary care physician

## 2019-07-04 ENCOUNTER — Ambulatory Visit: Payer: Medicare Other | Admitting: Physical Medicine & Rehabilitation

## 2019-07-07 ENCOUNTER — Encounter: Payer: Medicare Other | Attending: Physical Medicine & Rehabilitation | Admitting: Physical Medicine & Rehabilitation

## 2019-07-07 ENCOUNTER — Encounter: Payer: Self-pay | Admitting: Physical Medicine & Rehabilitation

## 2019-07-07 ENCOUNTER — Other Ambulatory Visit: Payer: Self-pay

## 2019-07-07 VITALS — BP 148/85 | HR 64 | Temp 97.7°F | Ht 65.0 in | Wt 242.0 lb

## 2019-07-07 DIAGNOSIS — M47817 Spondylosis without myelopathy or radiculopathy, lumbosacral region: Secondary | ICD-10-CM | POA: Insufficient documentation

## 2019-07-07 NOTE — Progress Notes (Signed)
  PROCEDURE RECORD [facility]   Name: Katelyn Thompson DOB:02-Mar-1966 MRN: [id number]  Date:07/07/2019  Physician: Claudette Laws, MD    Nurse/CMA: Danira Nylander, CMA   Allergies:  Allergies  Allergen Reactions  . Aspirin Other (See Comments)    esophagus disease heartburn  . Nabumetone Nausea And Vomiting and Other (See Comments)  . Nitrofurantoin Nausea And Vomiting  . Red Dye Rash    Kdc:red Dye+yellow Dye+nitrofurantoin+brilliant Blue Fcf    Consent Signed: Yes.    Is patient diabetic? No.  CBG today?   Pregnant: No. LMP: No LMP recorded. Patient has had a hysterectomy. (age 53-55)  Anticoagulants: no Anti-inflammatory: no Antibiotics: no  Procedure: right L3,4,5 radiofrequency neurotomy   Position: Prone Start Time: 1:49pm  End Time:  2:00pm     Fluoro Time: 32s  RN/CMA Wanona Stare, CMA Kween Bacorn, CMA    Time 1:35pm 2:10pm    BP 148/85 163/97    Pulse 64 67    Respirations 14 14    O2 Sat 94 94    S/S 6 6    Pain Level 7/10 5/10     D/C home with daughter, patient A & O X 3, D/C instructions reviewed, and sits independently.

## 2019-07-07 NOTE — Patient Instructions (Signed)
You had a radio frequency procedure today This was done to alleviate joint pain in your lumbar area We injected lidocaine which is a local anesthetic.  You may experience soreness at the injection sites. You may also experienced some irritation of the nerves that were heated I'm recommending ice for 30 minutes every 2 hours as needed for the next 24-48 hours  I will recommend taking one xanax prior to your next radiofrequency procedure

## 2019-07-07 NOTE — Progress Notes (Signed)
RightL5 dorsal ramus., Right L4 and Right L3 medial branch radio frequency neurotomy under fluoroscopic guidance   Indication: Low back pain due to lumbar spondylosis which has been relieved on 2 occasions by greater than 50% by lumbar medial branch blocks at corresponding levels.  Informed consent was obtained after describing risks and benefits of the procedure with the patient, this includes bleeding, bruising, infection, paralysis and medication side effects. The patient wishes to proceed and has given written consent. The patient was placed in a prone position. The lumbar and sacral area was marked and prepped with Betadine. A 25-gauge 1-1/2 inch needle was inserted into the skin and subcutaneous tissue at 3 sites in one ML of 1% lidocaine was injected into each site. Then a 18-gauge 15 cm radio frequency needle with a 1 cm curved active tip was inserted targeting the Right S1 SAP/sacral ala junction. Bone contact was made and confirmed with lateral imaging.  motor stimulation at 2 Hz confirm proper needle location followed by injection of 1ml 2% MPF lidocaine. Then the Right L5 SAP/transverse process junction was targeted. Bone contact was made and confirmed with lateral imaging.  motor stimulation at 2 Hz confirm proper needle location followed by injection of 1ml 2% MPF lidocaine. Then the Right L4 SAP/transverse process junction was targeted. Bone contact was made and confirmed with lateral imaging. motor stimulation at 2 Hz confirm proper needle location followed by injection of 1ml 2% MPF lidocaine. Radio frequency lesion being at 80C for 90 seconds was performed. Needles were removed. Post procedure instructions and vital signs were performed. Patient tolerated procedure well. Followup appointment was given. 

## 2019-08-04 ENCOUNTER — Other Ambulatory Visit: Payer: Self-pay | Admitting: Physical Medicine & Rehabilitation

## 2019-08-19 ENCOUNTER — Encounter: Payer: Self-pay | Admitting: Physical Medicine & Rehabilitation

## 2019-08-19 ENCOUNTER — Other Ambulatory Visit: Payer: Self-pay

## 2019-08-19 ENCOUNTER — Encounter: Payer: Medicare Other | Attending: Physical Medicine & Rehabilitation | Admitting: Physical Medicine & Rehabilitation

## 2019-08-19 VITALS — BP 144/91 | HR 56 | Temp 97.3°F | Ht 65.0 in | Wt 241.2 lb

## 2019-08-19 DIAGNOSIS — M47817 Spondylosis without myelopathy or radiculopathy, lumbosacral region: Secondary | ICD-10-CM

## 2019-08-19 NOTE — Progress Notes (Signed)
  PROCEDURE RECORD  Physical Medicine and Rehabilitation   Name: Katelyn Thompson DOB:13-Dec-1965 MRN: [id number]  Date:08/19/2019  Physician: Claudette Laws, MD    Nurse/Lee CMA  Allergies:  Allergies  Allergen Reactions  . Aspirin Other (See Comments)    esophagus disease heartburn  . Nabumetone Nausea And Vomiting and Other (See Comments)  . Nitrofurantoin Nausea And Vomiting  . Red Dye Rash    Kdc:red Dye+yellow Dye+nitrofurantoin+brilliant Blue Fcf    Consent Signed: Yes.    Is patient diabetic? No.  CBG today?   Pregnant: No. LMP: No LMP recorded. Patient has had a hysterectomy. (age 54-55)  Anticoagulants: no Anti-inflammatory: no Antibiotics: no  Procedure: L lumbar 3-4-5 radiofrequency neurotomy Position: Prone Start Time:3:40 pm  End Time:3:54 pm  Fluoro Time:39   RN/CMA Noheli Melder RN     Time 3:12pm 4:02pm    BP 144/91 124/72    Pulse 56 64    Respirations 14 14    O2 Sat 93 95    S/S 6 6    Pain Level 8/10 4/10     D/C home with self, patient A & O X 3, D/C instructions reviewed, and sits independently.

## 2019-08-19 NOTE — Progress Notes (Signed)
Left L5 dorsal ramus., left L4 and left L3 medial branch radio frequency neurotomy under fluoroscopic guidance  Indication: Low back pain due to lumbar spondylosis which has been relieved on 2 occasions by greater than 50% by lumbar medial branch blocks at corresponding levels. Has had 2 prior RFs of same nerves that have reduced pain for >52mo but have worn off  Informed consent was obtained after describing risks and benefits of the procedure with the patient, this includes bleeding, bruising, infection, paralysis and medication side effects. The patient wishes to proceed and has given written consent. The patient was placed in a prone position. The lumbar and sacral area was marked and prepped with Betadine. A 25-gauge 1-1/2 inch needle was inserted into the skin and subcutaneous tissue at 3 sites in one ML of 1% lidocaine was injected into each site. Then a 18-gauge 10 cm radio frequency needle with a 1 cm curved active tip was inserted targeting the left S1 SAP/sacral ala junction. Bone contact was made and confirmed with lateral imaging.  motor stimulation at 2 Hz confirm proper needle location followed by injection of one ML of 1% MPF lidocaine. Then the left L5 SAP/transverse process junction was targeted. Bone contact was made and confirmed with lateral imaging motor stimulation at 2 Hz confirm proper needle location followed by injection of one ML of the solution containing one ML of  1% MPF lidocaine. Then the left L4 SAP/transverse process junction was targeted. Bone contact was made and confirmed with lateral imaging. motor stimulation at 2 Hz confirm proper needle location followed by injection of one ML of the solution containing one ML of1% MPF lidocaine. Radio frequency lesion  at [facility] for 90 seconds was performed. Needles were removed. Post procedure instructions and vital signs were performed. Patient tolerated procedure well. Followup appointment was given.

## 2019-08-19 NOTE — Patient Instructions (Signed)

## 2019-09-01 ENCOUNTER — Other Ambulatory Visit: Payer: Self-pay | Admitting: Physical Medicine & Rehabilitation

## 2019-09-29 ENCOUNTER — Other Ambulatory Visit: Payer: Self-pay | Admitting: Physical Medicine & Rehabilitation

## 2019-11-20 ENCOUNTER — Other Ambulatory Visit: Payer: Self-pay

## 2019-11-20 ENCOUNTER — Encounter: Payer: Self-pay | Admitting: Physical Medicine & Rehabilitation

## 2019-11-20 ENCOUNTER — Encounter: Payer: Medicare Other | Attending: Physical Medicine & Rehabilitation | Admitting: Physical Medicine & Rehabilitation

## 2019-11-20 VITALS — BP 111/72 | HR 62 | Temp 98.9°F | Ht 65.0 in | Wt 240.6 lb

## 2019-11-20 DIAGNOSIS — Z79891 Long term (current) use of opiate analgesic: Secondary | ICD-10-CM | POA: Diagnosis not present

## 2019-11-20 DIAGNOSIS — G894 Chronic pain syndrome: Secondary | ICD-10-CM | POA: Insufficient documentation

## 2019-11-20 DIAGNOSIS — Z5181 Encounter for therapeutic drug level monitoring: Secondary | ICD-10-CM | POA: Diagnosis not present

## 2019-11-20 DIAGNOSIS — M47817 Spondylosis without myelopathy or radiculopathy, lumbosacral region: Secondary | ICD-10-CM | POA: Insufficient documentation

## 2019-11-20 MED ORDER — TRAMADOL HCL 50 MG PO TABS: 50.0000 mg | ORAL_TABLET | Freq: Three times a day (TID) | ORAL | 5 refills | Status: DC | PRN

## 2019-11-20 NOTE — Progress Notes (Signed)
Subjective:    Patient ID: Katelyn Thompson, female    DOB: [birth year], 54 y.o.   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  She does have some occasional pain that goes into the right lower extremity however she also complains of bilateral knee pain even after her right TKR.  Last MRI in epic was in 2007 showing some mild facet arthropathy in the lower lumbar area but no evidence of cyst.  May 2021Left L5 dorsal ramus., left L4 and left L3 medial branch radio frequency neurotomy under fluoroscopic guidance  July 07 2019    RightL5 dorsal ramus., Right L4 and Right L3 medial branch radio frequency neurotomy under fluoroscopic guidance      No exercise except housework  Has knee pain that limits exercise , s/p R TKR, Left knee hurts Takes tizanidine but makes her drowsy   Not taking cyclobenzaprine.  Was removed from her list.  Pain Inventory Average Pain 8 Pain Right Now 8 My pain is sharp, stabbing and aching  In the last 24 hours, has pain interfered with the following? General activity 6 Relation with others 2 Enjoyment of life 8 What TIME of day is your pain at its worst? morning , daytime, evening and night Sleep (in general) Poor  Pain is worse with: walking, bending and standing Pain improves with: heat/ice and medication Relief from Meds: 4  Family History  Problem Relation Age of Onset  . Heart disease Mother   . Diabetes Mother   . Asthma Mother   . Lung disease Mother   . Arthritis Mother   . Allergies Mother   . Heart attack Brother 48       deceased  . Colon cancer Neg Hx    Social History   Socioeconomic History  . Marital status: Married    Spouse name: Not on file  . Number of children: Not on file  . Years of education: 9th grade  . Highest education level: Not on file  Occupational History  . Occupation: disabled    Associate Professor: NOT EMPLOYED  Tobacco Use  . Smoking status: Former Smoker      Packs/day: 2.00    Years: 34.00    Pack years: 68.00    Types: Cigarettes    Quit date: 12/10/2015    Years since quitting: 3.9  . Smokeless tobacco: Never Used  . Tobacco comment: up to 3 ppd  Substance and Sexual Activity  . Alcohol use: No    Comment: rarely  . Drug use: No  . Sexual activity: Yes    Birth control/protection: Surgical  Other Topics Concern  . Not on file  Social History Narrative  . Not on file   Social Determinants of Health   Financial Resource Strain:   . Difficulty of Paying Living Expenses:   Food Insecurity:   . Worried About Programme researcher, broadcasting/film/video in the Last Year:   . Barista in the Last Year:   Transportation Needs:   . Freight forwarder (Medical):   Marland Kitchen Lack of Transportation (Non-Medical):   Physical Activity:   . Days of Exercise per Week:   . Minutes of Exercise per Session:   Stress:   . Feeling of Stress :   Social Connections:   . Frequency of Communication with Friends and Family:   . Frequency of Social Gatherings with Friends and Family:   . Attends Religious Services:   . Active  Member of Clubs or Organizations:   . Attends Banker Meetings:   Marland Kitchen Marital Status:    Past Surgical History:  Procedure Laterality Date  . ABDOMINAL HYSTERECTOMY    . ANKLE ARTHROSCOPY WITH DRILLING/MICROFRACTURE  02/26/2012   Procedure: ANKLE ARTHROSCOPY WITH DRILLING/MICROFRACTURE;  Surgeon: Vickki Hearing, MD;  Location: [facility];  Service: Orthopedics;  Laterality: Right;  . COLONOSCOPY  11/20/2011   RMR: Friable anorectum; single anal canal hemorrhoidal tag. Otherwise normal rectum and colon  . ESOPHAGOGASTRODUODENOSCOPY  06/2010   diagnosed with Barrett's, small hh, esophagus dilated. Next EGD 06/2011  . ESOPHAGOGASTRODUODENOSCOPY  Aug 2013   RMR: abnormal distal esophagus consistent with prior dx of SS Barrett;s. s/p dilation. small hiatal hernia. Biopsies: no metaplasia, dysplasia, malignancy. SURVEILLANCE AUG 2016  .  EXAM UNDER ANESTHESIA WITH MANIPULATION OF KNEE Right 09/16/2012   Procedure: EXAM UNDER ANESTHESIA WITH MANIPULATION OF RIGHT KNEE;  Surgeon: Vickki Hearing, MD;  Location: [facility];  Service: Orthopedics;  Laterality: Right;  . FOOT SURGERY     x 2  . KNEE ARTHROSCOPY  02/26/2012   Procedure: ARTHROSCOPY KNEE;  Surgeon: Vickki Hearing, MD;  Location: [facility];  Service: Orthopedics;  Laterality: Right;  with three chondroplasties  . KNEE ARTHROTOMY Left 11/21/2013   Procedure: KNEE ARTHROSCOPY  WITH MEDIAL, LATERAL, AND PATELLA  FEMORAL CHONDROPLASTY;  Surgeon: Vickki Hearing, MD;  Location: [facility];  Service: Orthopedics;  Laterality: Left;  . s/p hysterectomy    . SINUS SURGERY WITH INSTATRAK    . SURGERY OF LIP     biopsy  . TOTAL KNEE ARTHROPLASTY Right 07/01/2012   Procedure: TOTAL KNEE ARTHROPLASTY;  Surgeon: Vickki Hearing, MD;  Location: [facility];  Service: Orthopedics;  Laterality: Right;  . TUBAL LIGATION     Past Surgical History:  Procedure Laterality Date  . ABDOMINAL HYSTERECTOMY    . ANKLE ARTHROSCOPY WITH DRILLING/MICROFRACTURE  02/26/2012   Procedure: ANKLE ARTHROSCOPY WITH DRILLING/MICROFRACTURE;  Surgeon: Vickki Hearing, MD;  Location: [facility];  Service: Orthopedics;  Laterality: Right;  . COLONOSCOPY  11/20/2011   RMR: Friable anorectum; single anal canal hemorrhoidal tag. Otherwise normal rectum and colon  . ESOPHAGOGASTRODUODENOSCOPY  06/2010   diagnosed with Barrett's, small hh, esophagus dilated. Next EGD 06/2011  . ESOPHAGOGASTRODUODENOSCOPY  Aug 2013   RMR: abnormal distal esophagus consistent with prior dx of SS Barrett;s. s/p dilation. small hiatal hernia. Biopsies: no metaplasia, dysplasia, malignancy. SURVEILLANCE AUG 2016  . EXAM UNDER ANESTHESIA WITH MANIPULATION OF KNEE Right 09/16/2012   Procedure: EXAM UNDER ANESTHESIA WITH MANIPULATION OF RIGHT KNEE;  Surgeon: Vickki Hearing, MD;  Location: [facility];  Service: Orthopedics;  Laterality: Right;  .  FOOT SURGERY     x 2  . KNEE ARTHROSCOPY  02/26/2012   Procedure: ARTHROSCOPY KNEE;  Surgeon: Vickki Hearing, MD;  Location: [facility];  Service: Orthopedics;  Laterality: Right;  with three chondroplasties  . KNEE ARTHROTOMY Left 11/21/2013   Procedure: KNEE ARTHROSCOPY  WITH MEDIAL, LATERAL, AND PATELLA  FEMORAL CHONDROPLASTY;  Surgeon: Vickki Hearing, MD;  Location: [facility];  Service: Orthopedics;  Laterality: Left;  . s/p hysterectomy    . SINUS SURGERY WITH INSTATRAK    . SURGERY OF LIP     biopsy  . TOTAL KNEE ARTHROPLASTY Right 07/01/2012   Procedure: TOTAL KNEE ARTHROPLASTY;  Surgeon: Vickki Hearing, MD;  Location: [facility];  Service: Orthopedics;  Laterality: Right;  . TUBAL LIGATION  Past Medical History:  Diagnosis Date  . Anxiety   . Anxiety and depression    Does not like being around lots of people  . Arthritis   . Barrett's esophagus without dysplasia   . Bipolar 1 disorder (HCC)   . COPD (chronic obstructive pulmonary disease) (HCC)   . Depression   . GERD (gastroesophageal reflux disease)   . HTN (hypertension)   . Hypercholesteremia   . MS (multiple sclerosis) (HCC)    but not offcially disgnosed  . Nerve damage    Severe to left foot  . OA (osteoarthritis) of knee 01/31/2012  . PONV (postoperative nausea and vomiting)    BP 111/72   Pulse 62   Temp 98.9 F (37.2 C)   Ht 5\' 5"  (1.651 m)   Wt 240 lb 9.6 oz (109.1 kg)   SpO2 96%   BMI 40.04 kg/m   Opioid Risk Score:   Fall Risk Score:  `1  Depression screen PHQ 2/9  Depression screen [facility] 2/9 11/20/2019 08/19/2019 04/01/2018 02/19/2017 02/11/2016  Decreased Interest 1 1 1 1 3   Down, Depressed, Hopeless 1 1 1 1 3   PHQ - 2 Score 2 2 2 2 6   Altered sleeping - - - - 2  Tired, decreased energy - - - - 2  Change in appetite - - - - 2  Feeling bad or failure about yourself  - - - - 3  Trouble concentrating - - - - 2  Moving slowly or fidgety/restless - - - - 2  Suicidal thoughts - - - - 1  PHQ-9  Score - - - - 20  Some recent data might be hidden    Review of Systems  Constitutional: Negative.   HENT: Negative.   Eyes: Negative.   Respiratory: Negative.   Cardiovascular: Negative.   Gastrointestinal: Negative.   Endocrine: Negative.   Genitourinary: Negative.   Musculoskeletal: Positive for arthralgias and gait problem.  Skin: Negative.   Allergic/Immunologic: Negative.   Hematological: Negative.   Psychiatric/Behavioral: Positive for dysphoric mood.  All other systems reviewed and are negative.      Objective:   Physical Exam Vitals and nursing note reviewed.  Constitutional:      Appearance: She is obese.  HENT:     Head: Normocephalic and atraumatic.  Eyes:     Extraocular Movements: Extraocular movements intact.     Conjunctiva/sclera: Conjunctivae normal.     Pupils: Pupils are equal, round, and reactive to light.  Musculoskeletal:        General: Tenderness present.     Comments: Patient with tenderness around L4-L5 area bilaterally. Lumbar range of motion reduced to 50% flexion extension lateral bending and rotation extension as well as lateral bending cause pain, flexion is not very painful.  Skin:    General: Skin is warm and dry.  Neurological:     General: No focal deficit present.     Mental Status: She is alert and oriented to person, place, and time.  Psychiatric:        Mood and Affect: Mood normal.        Behavior: Behavior normal.    Lower extremity strength is 5/5 bilateral hip flexor knee extensor ankle dorsiflexor Negative straight leg raising Knees without evidence of effusion Sensation extremities normal light touch bilaterally L3-L4 L5-S1       Assessment & Plan:  #1.  Lumbar spondylosis without myelopathy. Continue tramadol 50 mg 3 times daily, needs to take tizanidine 4 mg up  to twice daily although mainly take just at night to help her sleep. We discussed radiofrequency neurotomy.  She gets good partial relief she is not a  very good historian and has difficulty explaining what degree of relief & for how long.  She does feel it has been helpful for her in general to maintain her ability to do housework and walk community distances. We will repeat right-sided RF end of September early October.  L3-L4-L5 levels.  Plan to repeat left side in December

## 2019-11-20 NOTE — Patient Instructions (Signed)
Repeat Right RF in Sept/Oct , Left side can be done around Christmas

## 2019-11-23 LAB — DRUG TOX MONITOR 1 W/CONF, ORAL FLD
Amphetamines: NEGATIVE ng/mL (ref <10)
Barbiturates: NEGATIVE ng/mL (ref <10)
Benzodiazepines: NEGATIVE ng/mL (ref <0.50)
Buprenorphine: NEGATIVE ng/mL (ref <0.10)
Cocaine: NEGATIVE ng/mL (ref <5.0)
Cotinine: 19.4 ng/mL — ABNORMAL HIGH (ref <5.0)
Fentanyl: NEGATIVE ng/mL (ref <0.10)
Heroin Metabolite: NEGATIVE ng/mL (ref <1.0)
MARIJUANA: NEGATIVE ng/mL (ref <2.5)
MDMA: NEGATIVE ng/mL (ref <10)
Meprobamate: NEGATIVE ng/mL (ref <2.5)
Methadone: NEGATIVE ng/mL (ref <5.0)
Nicotine Metabolite: POSITIVE ng/mL — AB (ref <5.0)
Opiates: NEGATIVE ng/mL (ref <2.5)
Phencyclidine: NEGATIVE ng/mL (ref <10)
Tapentadol: NEGATIVE ng/mL (ref <5.0)
Tramadol: 91.2 ng/mL — ABNORMAL HIGH (ref <5.0)
Tramadol: POSITIVE ng/mL — AB (ref <5.0)
Zolpidem: NEGATIVE ng/mL (ref <5.0)

## 2019-11-23 LAB — DRUG TOX ALC METAB W/CON, ORAL FLD: Alcohol Metabolite: NEGATIVE ng/mL (ref <25)

## 2019-11-26 ENCOUNTER — Telehealth: Payer: Self-pay | Admitting: *Deleted

## 2019-11-26 NOTE — Telephone Encounter (Signed)
Oral swab drug screen was consistent for prescribed medications.  ?

## 2019-12-29 ENCOUNTER — Other Ambulatory Visit: Payer: Self-pay | Admitting: Physical Medicine & Rehabilitation

## 2020-01-09 ENCOUNTER — Ambulatory Visit: Payer: Medicare Other | Admitting: Physical Medicine & Rehabilitation

## 2020-01-30 ENCOUNTER — Encounter: Payer: Medicare Other | Admitting: Physical Medicine & Rehabilitation

## 2020-02-06 ENCOUNTER — Other Ambulatory Visit: Payer: Self-pay

## 2020-02-06 ENCOUNTER — Encounter: Payer: Medicare Other | Attending: Physical Medicine & Rehabilitation | Admitting: Physical Medicine & Rehabilitation

## 2020-02-06 ENCOUNTER — Encounter: Payer: Self-pay | Admitting: Physical Medicine & Rehabilitation

## 2020-02-06 VITALS — BP 116/80 | HR 72 | Temp 98.1°F | Ht 65.0 in | Wt 238.4 lb

## 2020-02-06 DIAGNOSIS — M47817 Spondylosis without myelopathy or radiculopathy, lumbosacral region: Secondary | ICD-10-CM | POA: Diagnosis not present

## 2020-02-06 NOTE — Patient Instructions (Signed)

## 2020-02-06 NOTE — Progress Notes (Signed)
  PROCEDURE RECORD [place] Physical Medicine and Rehabilitation   Name: Katelyn Thompson DOB:1965/12/01 MRN: [id number]  Date:02/06/2020  Physician: Claudette Laws, MD    Nurse/CMA: Angelica Chessman CMA  Allergies:  Allergies  Allergen Reactions  . Aspirin Other (See Comments)    esophagus disease heartburn  . Hydrochlorothiazide     Other reaction(s): Other Hyponatremia  . Nabumetone Nausea And Vomiting and Other (See Comments)  . Nitrofurantoin Nausea And Vomiting  . Red Dye Rash    Kdc:red Dye+yellow Dye+nitrofurantoin+brilliant Blue Fcf    Consent Signed: Yes.    Is patient diabetic? No.  CBG today? N/A  Pregnant: No. LMP: No LMP recorded. Patient has had a hysterectomy. (age [age])  Anticoagulants: no Anti-inflammatory: no Antibiotics: no   Procedure: Right L3-L4 Radiofrequency  Position: Prone Start Time: 3:07pm End Time: 3:20pm Fluoro Time: 31  RN/CMA Latrelle Dodrill, CMA    Time 2:30 3:26pm    BP 116/80 137/84    Pulse 72 70    Respirations 16 16    O2 Sat 95 96    S/S 6 6    Pain Level 9/10 0/10     D/C home with Son, patient A & O X 3, D/C instructions reviewed, and sits independently.

## 2020-02-06 NOTE — Progress Notes (Signed)
RightL5 dorsal ramus., Right L4 and Right L3 medial branch radio frequency neurotomy under fluoroscopic guidance   Indication: Low back pain due to lumbar spondylosis which has been relieved on 2 occasions by greater than 50% by lumbar medial branch blocks at corresponding levels.  Informed consent was obtained after describing risks and benefits of the procedure with the patient, this includes bleeding, bruising, infection, paralysis and medication side effects. The patient wishes to proceed and has given written consent. The patient was placed in a prone position. The lumbar and sacral area was marked and prepped with Betadine. A 25-gauge 1-1/2 inch needle was inserted into the skin and subcutaneous tissue at 3 sites in one ML of 1% lidocaine was injected into each site. Then a 18-gauge 15 cm radio frequency needle with a 1 cm curved active tip was inserted targeting the Right S1 SAP/sacral ala junction. Bone contact was made and confirmed with lateral imaging.  motor stimulation at 2 Hz confirm proper needle location followed by injection of 1ml 2% MPF lidocaine. Then the Right L5 SAP/transverse process junction was targeted. Bone contact was made and confirmed with lateral imaging.  motor stimulation at 2 Hz confirm proper needle location followed by injection of 1ml 2% MPF lidocaine. Then the Right L4 SAP/transverse process junction was targeted. Bone contact was made and confirmed with lateral imaging. motor stimulation at 2 Hz confirm proper needle location followed by injection of 1ml 2% MPF lidocaine. Radio frequency lesion being at 80C for 90 seconds was performed. Needles were removed. Post procedure instructions and vital signs were performed. Patient tolerated procedure well. Followup appointment was given. 

## 2020-03-12 ENCOUNTER — Encounter: Payer: Self-pay | Admitting: Physical Medicine & Rehabilitation

## 2020-03-12 ENCOUNTER — Encounter: Payer: Medicare Other | Attending: Physical Medicine & Rehabilitation | Admitting: Physical Medicine & Rehabilitation

## 2020-03-12 ENCOUNTER — Other Ambulatory Visit: Payer: Self-pay

## 2020-03-12 VITALS — BP 105/70 | HR 63 | Temp 98.3°F | Ht 65.0 in | Wt 240.2 lb

## 2020-03-12 DIAGNOSIS — M47817 Spondylosis without myelopathy or radiculopathy, lumbosacral region: Secondary | ICD-10-CM | POA: Diagnosis present

## 2020-03-12 NOTE — Patient Instructions (Signed)

## 2020-03-12 NOTE — Progress Notes (Signed)
Left L5 dorsal ramus., left L4 and left L3 medial branch radio frequency neurotomy under fluoroscopic guidance  Indication: Low back pain due to lumbar spondylosis which has been relieved on 2 occasions by greater than 50% by lumbar medial branch blocks at corresponding levels.  Informed consent was obtained after describing risks and benefits of the procedure with the patient, this includes bleeding, bruising, infection, paralysis and medication side effects. The patient wishes to proceed and has given written consent. The patient was placed in a prone position. The lumbar and sacral area was marked and prepped with Betadine. A 25-gauge 1-1/2 inch needle was inserted into the skin and subcutaneous tissue at 3 sites in one ML of 2% lidocaine was injected into each site. Then a 18-gauge 15 cm radio frequency needle with a 1 cm curved active tip was inserted targeting the left S1 SAP/sacral ala junction. Bone contact was made and confirmed with lateral imaging.  motor stimulation at 2 Hz confirm proper needle location followed by injection of one ML of 2% MPF lidocaine. Then the left L5 SAP/transverse process junction was targeted. Bone contact was made and confirmed with lateral imaging motor stimulation at 2 Hz confirm proper needle location followed by injection of one ML of the solution containing one ML of  2% MPF lidocaine. Then the left L4 SAP/transverse process junction was targeted. Bone contact was made and confirmed with lateral imaging. motor stimulation at 2 Hz confirm proper needle location followed by injection of one ML of the solution containing one ML of2% MPF lidocaine. Radio frequency lesion  at 80C for 90 seconds was performed. Needles were removed. Post procedure instructions and vital signs were performed. Patient tolerated procedure well. Followup appointment was given.  

## 2020-03-12 NOTE — Progress Notes (Signed)
  PROCEDURE RECORD [place] Physical Medicine and Rehabilitation   Name: LAVERA VANDERMEER DOB:1965-06-14 MRN: [id number]  Date:03/12/2020  Physician: Claudette Laws, MD    Nurse/CMA: Nedra Hai CMA  Allergies:  Allergies  Allergen Reactions  . Aspirin Other (See Comments)    esophagus disease heartburn  . Hydrochlorothiazide     Other reaction(s): Other Hyponatremia  . Nabumetone Nausea And Vomiting and Other (See Comments)  . Nitrofurantoin Nausea And Vomiting  . Red Dye Rash    Kdc:red Dye+yellow Dye+nitrofurantoin+brilliant Blue Fcf    Consent Signed: Yes.    Is patient diabetic? No.  CBG today? N/A  Pregnant: No. LMP: No LMP recorded. Patient has had a hysterectomy. (age [age])  Anticoagulants: no Anti-inflammatory: no Antibiotics: Finished Amoxicillin today for sinus infection   Procedure: Left L3,4,5 Radiofrequency Position: Prone Start Time:3:20pm End Time:3:34pm  Fluoro Time:27  RN/CMA Latrelle Dodrill, CMA    Time 2:54PM 3:48pm    BP 105/70 131/84    Pulse 63 62    Respirations 14 14    O2 Sat 95 96    S/S 6 6    Pain Level 7/10 3/10     D/C home with Spouse, patient A & O X 3, D/C instructions reviewed, and sits independently.

## 2020-05-03 ENCOUNTER — Other Ambulatory Visit: Payer: Self-pay | Admitting: Physical Medicine & Rehabilitation

## 2020-05-19 ENCOUNTER — Other Ambulatory Visit: Payer: Self-pay | Admitting: Physical Medicine & Rehabilitation

## 2020-06-23 ENCOUNTER — Telehealth: Payer: Self-pay | Admitting: *Deleted

## 2020-07-13 ENCOUNTER — Encounter: Payer: Self-pay | Admitting: Physical Medicine & Rehabilitation

## 2020-07-13 ENCOUNTER — Encounter: Payer: Medicare Other | Attending: Physical Medicine & Rehabilitation | Admitting: Physical Medicine & Rehabilitation

## 2020-07-13 ENCOUNTER — Other Ambulatory Visit: Payer: Self-pay

## 2020-07-13 VITALS — BP 102/67 | HR 75 | Temp 97.9°F | Ht 65.0 in | Wt 238.4 lb

## 2020-07-13 DIAGNOSIS — Z79891 Long term (current) use of opiate analgesic: Secondary | ICD-10-CM | POA: Insufficient documentation

## 2020-07-13 DIAGNOSIS — G894 Chronic pain syndrome: Secondary | ICD-10-CM | POA: Diagnosis not present

## 2020-07-13 DIAGNOSIS — Z5181 Encounter for therapeutic drug level monitoring: Secondary | ICD-10-CM | POA: Insufficient documentation

## 2020-07-13 NOTE — Patient Instructions (Signed)
Right RF in May , Left RF in June

## 2020-07-13 NOTE — Progress Notes (Signed)
Subjective:    Patient ID: Katelyn Thompson, female    DOB: November 12, 1965, 55 y.o.   MRN: [id number]  HPI 55 year old female with chronic low back pain secondary to lumbar spondylosis without myelopathy.  She has responded well to lumbar medial branch blocks as well as lumbar radiofrequency neurotomy with last procedures performed on the right side on 02/06/2020 and on the left side on 03/12/2020.  The patient states that the left side is actually hurting a little bit more than the right side.  She does feel like both sides of her back have been starting to hurt her a little bit more.  Date: 02/06/2020      Editor: Erick Colace, MD (Physician)                RightL5 dorsal ramus., Right L4 and Right L3 medial branch radio frequency neurotomy under fluoroscopic guidance      Note Status: Signed Cosign: Cosign Not Required Encounter Date: 03/12/2020  Editor: Erick Colace, MD (Physician)              Left L5 dorsal ramus., left L4 and left L3 medial branch radio frequency neurotomy under fluoroscopic guidance       Pain Inventory Average Pain 8 Pain Right Now 8 My pain is constant, sharp, stabbing and aching  In the last 24 hours, has pain interfered with the following? General activity 7 Relation with others 5 Enjoyment of life 10 What TIME of day is your pain at its worst? daytime, evening and night Sleep (in general) Poor  Pain is worse with: walking, bending, sitting, inactivity and standing Pain improves with: rest, medication and injections Relief from Meds: FAIR  Family History  Problem Relation Age of Onset  . Heart disease Mother   . Diabetes Mother   . Asthma Mother   . Lung disease Mother   . Arthritis Mother   . Allergies Mother   . Heart attack Brother 48       deceased  . Colon cancer Neg Hx    Social History   Socioeconomic History  . Marital status: Married    Spouse name: Not on file  . Number of children: Not on file  . Years of  education: 9th grade  . Highest education level: Not on file  Occupational History  . Occupation: disabled    Associate Professor: NOT EMPLOYED  Tobacco Use  . Smoking status: Former Smoker    Packs/day: 2.00    Years: 34.00    Pack years: 68.00    Types: Cigarettes    Quit date: 12/10/2015    Years since quitting: 4.5  . Smokeless tobacco: Never Used  . Tobacco comment: up to 3 ppd  Vaping Use  . Vaping Use: Never used  Substance and Sexual Activity  . Alcohol use: No    Comment: rarely  . Drug use: No  . Sexual activity: Yes    Birth control/protection: Surgical  Other Topics Concern  . Not on file  Social History Narrative  . Not on file   Social Determinants of Health   Financial Resource Strain: Not on file  Food Insecurity: Not on file  Transportation Needs: Not on file  Physical Activity: Not on file  Stress: Not on file  Social Connections: Not on file   Past Surgical History:  Procedure Laterality Date  . ABDOMINAL HYSTERECTOMY    . ANKLE ARTHROSCOPY WITH DRILLING/MICROFRACTURE  02/26/2012   Procedure: ANKLE ARTHROSCOPY WITH DRILLING/MICROFRACTURE;  Surgeon: Vickki Hearing, MD;  Location: [facility];  Service: Orthopedics;  Laterality: Right;  . COLONOSCOPY  11/20/2011   RMR: Friable anorectum; single anal canal hemorrhoidal tag. Otherwise normal rectum and colon  . ESOPHAGOGASTRODUODENOSCOPY  06/2010   diagnosed with Barrett's, small hh, esophagus dilated. Next EGD 06/2011  . ESOPHAGOGASTRODUODENOSCOPY  Aug 2013   RMR: abnormal distal esophagus consistent with prior dx of SS Barrett;s. s/p dilation. small hiatal hernia. Biopsies: no metaplasia, dysplasia, malignancy. SURVEILLANCE AUG 2016  . EXAM UNDER ANESTHESIA WITH MANIPULATION OF KNEE Right 09/16/2012   Procedure: EXAM UNDER ANESTHESIA WITH MANIPULATION OF RIGHT KNEE;  Surgeon: Vickki Hearing, MD;  Location: [facility];  Service: Orthopedics;  Laterality: Right;  . FOOT SURGERY     x 2  . KNEE ARTHROSCOPY  02/26/2012    Procedure: ARTHROSCOPY KNEE;  Surgeon: Vickki Hearing, MD;  Location: [facility];  Service: Orthopedics;  Laterality: Right;  with three chondroplasties  . KNEE ARTHROTOMY Left 11/21/2013   Procedure: KNEE ARTHROSCOPY  WITH MEDIAL, LATERAL, AND PATELLA  FEMORAL CHONDROPLASTY;  Surgeon: Vickki Hearing, MD;  Location: [facility];  Service: Orthopedics;  Laterality: Left;  . s/p hysterectomy    . SINUS SURGERY WITH INSTATRAK    . SURGERY OF LIP     biopsy  . TOTAL KNEE ARTHROPLASTY Right 07/01/2012   Procedure: TOTAL KNEE ARTHROPLASTY;  Surgeon: Vickki Hearing, MD;  Location: [facility];  Service: Orthopedics;  Laterality: Right;  . TUBAL LIGATION     Past Surgical History:  Procedure Laterality Date  . ABDOMINAL HYSTERECTOMY    . ANKLE ARTHROSCOPY WITH DRILLING/MICROFRACTURE  02/26/2012   Procedure: ANKLE ARTHROSCOPY WITH DRILLING/MICROFRACTURE;  Surgeon: Vickki Hearing, MD;  Location: [facility];  Service: Orthopedics;  Laterality: Right;  . COLONOSCOPY  11/20/2011   RMR: Friable anorectum; single anal canal hemorrhoidal tag. Otherwise normal rectum and colon  . ESOPHAGOGASTRODUODENOSCOPY  06/2010   diagnosed with Barrett's, small hh, esophagus dilated. Next EGD 06/2011  . ESOPHAGOGASTRODUODENOSCOPY  Aug 2013   RMR: abnormal distal esophagus consistent with prior dx of SS Barrett;s. s/p dilation. small hiatal hernia. Biopsies: no metaplasia, dysplasia, malignancy. SURVEILLANCE AUG 2016  . EXAM UNDER ANESTHESIA WITH MANIPULATION OF KNEE Right 09/16/2012   Procedure: EXAM UNDER ANESTHESIA WITH MANIPULATION OF RIGHT KNEE;  Surgeon: Vickki Hearing, MD;  Location: [facility];  Service: Orthopedics;  Laterality: Right;  . FOOT SURGERY     x 2  . KNEE ARTHROSCOPY  02/26/2012   Procedure: ARTHROSCOPY KNEE;  Surgeon: Vickki Hearing, MD;  Location: [facility];  Service: Orthopedics;  Laterality: Right;  with three chondroplasties  . KNEE ARTHROTOMY Left 11/21/2013   Procedure: KNEE ARTHROSCOPY  WITH  MEDIAL, LATERAL, AND PATELLA  FEMORAL CHONDROPLASTY;  Surgeon: Vickki Hearing, MD;  Location: [facility];  Service: Orthopedics;  Laterality: Left;  . s/p hysterectomy    . SINUS SURGERY WITH INSTATRAK    . SURGERY OF LIP     biopsy  . TOTAL KNEE ARTHROPLASTY Right 07/01/2012   Procedure: TOTAL KNEE ARTHROPLASTY;  Surgeon: Vickki Hearing, MD;  Location: [facility];  Service: Orthopedics;  Laterality: Right;  . TUBAL LIGATION     Past Medical History:  Diagnosis Date  . Anxiety   . Anxiety and depression    Does not like being around lots of people  . Arthritis   . Barrett's esophagus without dysplasia   . Bipolar 1 disorder (HCC)   . COPD (chronic obstructive  pulmonary disease) (HCC)   . Depression   . GERD (gastroesophageal reflux disease)   . HTN (hypertension)   . Hypercholesteremia   . MS (multiple sclerosis) (HCC)    but not offcially disgnosed  . Nerve damage    Severe to left foot  . OA (osteoarthritis) of knee 01/31/2012  . PONV (postoperative nausea and vomiting)    BP 102/67   Pulse 75   Temp 97.9 F (36.6 C)   Ht 5\' 5"  (1.651 m)   Wt 238 lb 6.4 oz (108.1 kg)   SpO2 96%   BMI 39.67 kg/m   Opioid Risk Score:   Fall Risk Score:  `1  Depression screen PHQ 2/9  Depression screen [facility] 2/9 03/12/2020 02/06/2020 11/20/2019 08/19/2019 04/01/2018 02/19/2017 02/11/2016  Decreased Interest 0 1 1 1 1 1 3   Down, Depressed, Hopeless 0 1 1 1 1 1 3   PHQ - 2 Score 0 2 2 2 2 2 6   Altered sleeping - - - - - - 2  Tired, decreased energy - - - - - - 2  Change in appetite - - - - - - 2  Feeling bad or failure about yourself  - - - - - - 3  Trouble concentrating - - - - - - 2  Moving slowly or fidgety/restless - - - - - - 2  Suicidal thoughts - - - - - - 1  PHQ-9 Score - - - - - - 20  Some recent data might be hidden   Review of Systems  Musculoskeletal: Positive for back pain and gait problem.       Pain in the knees , left foot, &  thigh  All other systems reviewed and are  negative.      Objective:   Physical Exam Vitals and nursing note reviewed.  Constitutional:      Appearance: She is obese.  HENT:     Head: Normocephalic and atraumatic.  Eyes:     Extraocular Movements: Extraocular movements intact.     Conjunctiva/sclera: Conjunctivae normal.     Pupils: Pupils are equal, round, and reactive to light.  Musculoskeletal:        General: Tenderness present. No swelling.     Comments: Pain to palpation lumbar spine L4-L5 paraspinals as well as S1 area bilaterally. Patient with 50% range lumbar flexion extension lateral bending and rotation.  Skin:    General: Skin is warm and dry.  Neurological:     Mental Status: She is alert and oriented to person, place, and time.     Coordination: Coordination normal.     Gait: Gait normal.  Psychiatric:        Mood and Affect: Mood normal.        Behavior: Behavior normal.   Motor strength is 5/5 bilateral hip flexors knee extensors ankle dorsiflexors        Assessment & Plan:  #1.  Lumbar spondylosis without myelopathy patient has responded well to radiofrequency neurotomy starting to feel effects wearing off may repeat the right side L3-4-5 in May and will follow with the left side L3-4-5 in June Continue tramadol 50 mg 3 times daily, UDS today

## 2020-07-20 LAB — TOXASSURE SELECT,+ANTIDEPR,UR

## 2020-07-21 ENCOUNTER — Telehealth: Payer: Self-pay | Admitting: *Deleted

## 2020-07-21 NOTE — Telephone Encounter (Signed)
Urine drug screen for this encounter is consistent for prescribed medication 

## 2020-08-13 ENCOUNTER — Encounter: Payer: Self-pay | Admitting: Physical Medicine & Rehabilitation

## 2020-08-13 ENCOUNTER — Encounter: Payer: Medicare Other | Attending: Physical Medicine & Rehabilitation | Admitting: Physical Medicine & Rehabilitation

## 2020-08-13 ENCOUNTER — Other Ambulatory Visit: Payer: Self-pay

## 2020-08-13 VITALS — BP 114/75 | HR 66 | Temp 98.0°F | Ht 65.0 in | Wt 241.8 lb

## 2020-08-13 DIAGNOSIS — M47817 Spondylosis without myelopathy or radiculopathy, lumbosacral region: Secondary | ICD-10-CM | POA: Diagnosis present

## 2020-08-13 NOTE — Patient Instructions (Signed)
RightL5 dorsal ramus., Right L4 and Right L3 medial branch radio frequency neurotomy under fluoroscopic guidance   Indication: Low back pain due to lumbar spondylosis which has been relieved on 2 occasions by greater than 50% by lumbar medial branch blocks at corresponding levels. Pain is moderate to severe and is not relieved by oral meds or PT  Informed consent was obtained after describing risks and benefits of the procedure with the patient, this includes bleeding, bruising, infection, paralysis and medication side effects. The patient wishes to proceed and has given written consent. The patient was placed in a prone position. The lumbar and sacral area was marked and prepped with Betadine. A 25-gauge 1-1/2 inch needle was inserted into the skin and subcutaneous tissue at 3 sites in one ML of 1% lidocaine was injected into each site. Then a 18-gauge 15 cm radio frequency needle with a 1 cm curved active tip was inserted targeting the Right S1 SAP/sacral ala junction. Bone contact was made and confirmed with lateral imaging.  motor stimulation at 2 Hz confirm proper needle location followed by injection of 40ml 2% MPF lidocaine. Then the Right L5 SAP/transverse process junction was targeted. Bone contact was made and confirmed with lateral imaging.  motor stimulation at 2 Hz confirm proper needle location followed by injection of 67ml 2% MPF lidocaine. Then the Right L4 SAP/transverse process junction was targeted. Bone contact was made and confirmed with lateral imaging. motor stimulation at 2 Hz confirm proper needle location followed by injection of 39ml 2% MPF lidocaine. Radio frequency lesion being at [facility] for 90 seconds was performed. Needles were removed. Post procedure instructions and vital signs were performed. Patient tolerated procedure well. Followup appointment was given.

## 2020-08-13 NOTE — Progress Notes (Signed)
  PROCEDURE RECORD [facility]   Name: Katelyn Thompson DOB:07/07/1965 MRN: [id number]  Date:08/13/2020  Physician: Claudette Laws, MD    Nurse/CMA:Robbin Jimmey Ralph MA  Allergies:  Allergies  Allergen Reactions  . Aspirin Other (See Comments)    esophagus disease heartburn  . Hydrochlorothiazide     Other reaction(s): Other Hyponatremia  . Nabumetone Nausea And Vomiting and Other (See Comments)  . Nitrofurantoin Nausea And Vomiting  . Red Dye Rash    Kdc:red Dye+yellow Dye+nitrofurantoin+brilliant Blue Fcf    Consent Signed: Yes.    Is patient diabetic? No.  CBG today?   Pregnant: No. LMP: No LMP recorded. Patient has had a hysterectomy. (age [age])  Anticoagulants: no Anti-inflammatory: no Antibiotics: no  Procedure: right lumbar 3-4-5 radiofrequency neurotomy Position: Prone Start Time: 3:12 PM End Time: 3:28 Fluoro Time:31   RN/CMA Music therapist MA    Time 245 p 3:30 PM    BP 114/75 118/73    Pulse 66 60    Respirations 14 16    O2 Sat 97 95    S/S 6 6    Pain Level 8/10 2/10     D/C home Husband, patient A & O X 3, D/C instructions reviewed, and sits independently.

## 2020-08-13 NOTE — Progress Notes (Signed)
RightL5 dorsal ramus., Right L4 and Right L3 medial branch radio frequency neurotomy under fluoroscopic guidance   Indication: Low back pain due to lumbar spondylosis which has been relieved on 2 occasions by greater than 50% by lumbar medial branch blocks at corresponding levels.  Informed consent was obtained after describing risks and benefits of the procedure with the patient, this includes bleeding, bruising, infection, paralysis and medication side effects. The patient wishes to proceed and has given written consent. The patient was placed in a prone position. The lumbar and sacral area was marked and prepped with Betadine. A 25-gauge 1-1/2 inch needle was inserted into the skin and subcutaneous tissue at 3 sites in one ML of 1% lidocaine was injected into each site. Then a 18-gauge 15 cm radio frequency needle with a 1 cm curved active tip was inserted targeting the Right S1 SAP/sacral ala junction. Bone contact was made and confirmed with lateral imaging.  motor stimulation at 2 Hz confirm proper needle location followed by injection of 1ml 2% MPF lidocaine. Then the Right L5 SAP/transverse process junction was targeted. Bone contact was made and confirmed with lateral imaging.  motor stimulation at 2 Hz confirm proper needle location followed by injection of 1ml 2% MPF lidocaine. Then the Right L4 SAP/transverse process junction was targeted. Bone contact was made and confirmed with lateral imaging. motor stimulation at 2 Hz confirm proper needle location followed by injection of 1ml 2% MPF lidocaine. Radio frequency lesion being at 80C for 90 seconds was performed. Needles were removed. Post procedure instructions and vital signs were performed. Patient tolerated procedure well. Followup appointment was given. 

## 2020-08-30 ENCOUNTER — Other Ambulatory Visit: Payer: Self-pay | Admitting: Physical Medicine & Rehabilitation

## 2020-09-21 ENCOUNTER — Encounter: Payer: Self-pay | Admitting: Physical Medicine & Rehabilitation

## 2020-09-21 ENCOUNTER — Encounter: Payer: Medicare Other | Attending: Physical Medicine & Rehabilitation | Admitting: Physical Medicine & Rehabilitation

## 2020-09-21 ENCOUNTER — Other Ambulatory Visit: Payer: Self-pay

## 2020-09-21 VITALS — BP 125/83 | HR 70 | Temp 98.5°F | Ht 65.0 in | Wt 236.6 lb

## 2020-09-21 DIAGNOSIS — M47817 Spondylosis without myelopathy or radiculopathy, lumbosacral region: Secondary | ICD-10-CM | POA: Diagnosis not present

## 2020-09-21 NOTE — Progress Notes (Signed)
Left L5 dorsal ramus., left L4 and left L3 medial branch radio frequency neurotomy under fluoroscopic guidance  Indication: Low back pain due to lumbar spondylosis which has been relieved on 2 occasions by greater than 50% by lumbar medial branch blocks at corresponding levels.  Informed consent was obtained after describing risks and benefits of the procedure with the patient, this includes bleeding, bruising, infection, paralysis and medication side effects. The patient wishes to proceed and has given written consent. The patient was placed in a prone position. The lumbar and sacral area was marked and prepped with Betadine. A 25-gauge 1-1/2 inch needle was inserted into the skin and subcutaneous tissue at 3 sites in one ML of 2% lidocaine was injected into each site. Then a 18-gauge 15 cm radio frequency needle with a 1 cm curved active tip was inserted targeting the left S1 SAP/sacral ala junction. Bone contact was made and confirmed with lateral imaging.  motor stimulation at 2 Hz confirm proper needle location followed by injection of one ML of 2% MPF lidocaine. Then the left L5 SAP/transverse process junction was targeted. Bone contact was made and confirmed with lateral imaging motor stimulation at 2 Hz confirm proper needle location followed by injection of one ML of the solution containing one ML of  2% MPF lidocaine. Then the left L4 SAP/transverse process junction was targeted. Bone contact was made and confirmed with lateral imaging. motor stimulation at 2 Hz confirm proper needle location followed by injection of one ML of the solution containing one ML of2% MPF lidocaine. Radio frequency lesion  at 80C for 90 seconds was performed. Needles were removed. Post procedure instructions and vital signs were performed. Patient tolerated procedure well. Followup appointment was given. 1 

## 2020-09-21 NOTE — Patient Instructions (Signed)
You had a radio frequency procedure today This was done to alleviate joint pain in your lumbar area We injected lidocaine which is a local anesthetic.  You may experience soreness at the injection sites. You may also experienced some irritation of the nerves that were heated I'm recommending ice for 30 minutes every 2 hours as needed for the next 24-48 hours   

## 2020-09-21 NOTE — Progress Notes (Signed)
  PROCEDURE RECORD [facility]   Name: Katelyn Thompson DOB:07-Dec-1965 MRN: [id number]  Date:09/21/2020  Physician: Claudette Laws, MD    Nurse/CMA: Nedra Hai, CMA  Allergies:  Allergies  Allergen Reactions   Aspirin Other (See Comments)    esophagus disease heartburn   Hydrochlorothiazide     Other reaction(s): Other Hyponatremia   Nabumetone Nausea And Vomiting and Other (See Comments)   Nitrofurantoin Nausea And Vomiting   Red Dye Rash    Kdc:red Dye+yellow Dye+nitrofurantoin+brilliant Blue Fcf    Consent Signed: Yes.    Is patient diabetic? No.  CBG today? na  Pregnant: No. LMP: No LMP recorded. Patient has had a hysterectomy. (age [age])  Anticoagulants: no Anti-inflammatory: no Antibiotics: no  Procedure: Left L3-4-5 Radiofrequency  Position: Prone Start Time: 3:20pm  End Time: 3:40pm  Fluoro Time: 33  RN/CMA Nedra Hai, CMA Katelyn Thompson, CMA    Time 2:36pm 3:43pm    BP 125/83 138/80    Pulse 70 66    Respirations 16 16    O2 Sat 93 94    S/S 6 6    Pain Level 9/10 3/10     D/C home with daughter, patient A & O X 3, D/C instructions reviewed, and sits independently.

## 2020-11-20 ENCOUNTER — Other Ambulatory Visit: Payer: Self-pay | Admitting: Physical Medicine & Rehabilitation

## 2020-12-22 ENCOUNTER — Other Ambulatory Visit: Payer: Self-pay | Admitting: Physical Medicine & Rehabilitation

## 2020-12-23 NOTE — Telephone Encounter (Signed)
Sent to [facility] in [place]

## 2021-01-03 ENCOUNTER — Other Ambulatory Visit: Payer: Self-pay | Admitting: Physical Medicine & Rehabilitation

## 2021-01-10 ENCOUNTER — Emergency Department (HOSPITAL_COMMUNITY): Payer: Medicare Other

## 2021-01-10 ENCOUNTER — Other Ambulatory Visit: Payer: Self-pay

## 2021-01-10 ENCOUNTER — Emergency Department (HOSPITAL_COMMUNITY)
Admission: EM | Admit: 2021-01-10 | Discharge: 2021-01-10 | Disposition: A | Discharge status: Home / Self Care | Payer: Medicare Other | Attending: Emergency Medicine | Admitting: Emergency Medicine

## 2021-01-10 DIAGNOSIS — R410 Disorientation, unspecified: Secondary | ICD-10-CM | POA: Insufficient documentation

## 2021-01-10 DIAGNOSIS — Z87891 Personal history of nicotine dependence: Secondary | ICD-10-CM | POA: Diagnosis not present

## 2021-01-10 DIAGNOSIS — A419 Sepsis, unspecified organism: Secondary | ICD-10-CM | POA: Insufficient documentation

## 2021-01-10 DIAGNOSIS — E876 Hypokalemia: Secondary | ICD-10-CM | POA: Insufficient documentation

## 2021-01-10 DIAGNOSIS — Z79899 Other long term (current) drug therapy: Secondary | ICD-10-CM | POA: Insufficient documentation

## 2021-01-10 DIAGNOSIS — Z96651 Presence of right artificial knee joint: Secondary | ICD-10-CM | POA: Insufficient documentation

## 2021-01-10 DIAGNOSIS — I10 Essential (primary) hypertension: Secondary | ICD-10-CM | POA: Insufficient documentation

## 2021-01-10 DIAGNOSIS — R4182 Altered mental status, unspecified: Secondary | ICD-10-CM | POA: Diagnosis present

## 2021-01-10 DIAGNOSIS — U071 COVID-19: Secondary | ICD-10-CM | POA: Insufficient documentation

## 2021-01-10 DIAGNOSIS — J449 Chronic obstructive pulmonary disease, unspecified: Secondary | ICD-10-CM | POA: Diagnosis not present

## 2021-01-10 LAB — PROTIME-INR
INR: 1 (ref 0.8–1.2)
Prothrombin Time: 13.4 seconds (ref 11.4–15.2)

## 2021-01-10 LAB — URINALYSIS, ROUTINE W REFLEX MICROSCOPIC
Bilirubin Urine: NEGATIVE
Glucose, UA: NEGATIVE mg/dL
Ketones, ur: 5 mg/dL — AB
Leukocytes,Ua: NEGATIVE
Nitrite: NEGATIVE
Protein, ur: NEGATIVE mg/dL
Specific Gravity, Urine: 1.019 (ref 1.005–1.030)
pH: 5 (ref 5.0–8.0)

## 2021-01-10 LAB — CBC WITH DIFFERENTIAL/PLATELET
Abs Immature Granulocytes: 0.03 10*3/uL (ref 0.00–0.07)
Basophils Absolute: 0 10*3/uL (ref 0.0–0.1)
Basophils Relative: 1 %
Eosinophils Absolute: 0 10*3/uL (ref 0.0–0.5)
Eosinophils Relative: 0 %
HCT: 38.9 % (ref 36.0–46.0)
Hemoglobin: 14 g/dL (ref 12.0–15.0)
Immature Granulocytes: 1 %
Lymphocytes Relative: 16 %
Lymphs Abs: 0.9 10*3/uL (ref 0.7–4.0)
MCH: 32.6 pg (ref 26.0–34.0)
MCHC: 36 g/dL (ref 30.0–36.0)
MCV: 90.5 fL (ref 80.0–100.0)
Monocytes Absolute: 0.6 10*3/uL (ref 0.1–1.0)
Monocytes Relative: 11 %
Neutro Abs: 4 10*3/uL (ref 1.7–7.7)
Neutrophils Relative %: 71 %
Platelets: 200 10*3/uL (ref 150–400)
RBC: 4.3 MIL/uL (ref 3.87–5.11)
RDW: 11.4 % — ABNORMAL LOW (ref 11.5–15.5)
WBC: 5.5 10*3/uL (ref 4.0–10.5)
nRBC: 0 % (ref 0.0–0.2)

## 2021-01-10 LAB — COMPREHENSIVE METABOLIC PANEL
ALT: 48 U/L — ABNORMAL HIGH (ref 0–44)
AST: 77 U/L — ABNORMAL HIGH (ref 15–41)
Albumin: 4.3 g/dL (ref 3.5–5.0)
Alkaline Phosphatase: 68 U/L (ref 38–126)
Anion gap: 11 (ref 5–15)
BUN: 8 mg/dL (ref 6–20)
CO2: 28 mmol/L (ref 22–32)
Calcium: 8.6 mg/dL — ABNORMAL LOW (ref 8.9–10.3)
Chloride: 90 mmol/L — ABNORMAL LOW (ref 98–111)
Creatinine, Ser: 0.65 mg/dL (ref 0.44–1.00)
GFR, Estimated: >60 mL/min (ref >60)
Glucose, Bld: 124 mg/dL — ABNORMAL HIGH (ref 70–99)
Potassium: 2.9 mmol/L — ABNORMAL LOW (ref 3.5–5.1)
Sodium: 129 mmol/L — ABNORMAL LOW (ref 135–145)
Total Bilirubin: 0.5 mg/dL (ref 0.3–1.2)
Total Protein: 7.1 g/dL (ref 6.5–8.1)

## 2021-01-10 LAB — RESP PANEL BY RT-PCR (FLU A&B, COVID) ARPGX2
Influenza A by PCR: NEGATIVE
Influenza B by PCR: NEGATIVE
SARS Coronavirus 2 by RT PCR: POSITIVE — AB

## 2021-01-10 LAB — APTT: aPTT: 30 seconds (ref 24–36)

## 2021-01-10 LAB — LACTIC ACID, PLASMA
Lactic Acid, Venous: 1.2 mmol/L (ref 0.5–1.9)
Lactic Acid, Venous: 1.4 mmol/L (ref 0.5–1.9)

## 2021-01-10 LAB — BLOOD GAS, VENOUS
Acid-Base Excess: 4.8 mmol/L — ABNORMAL HIGH (ref 0.0–2.0)
Bicarbonate: 28.6 mmol/L — ABNORMAL HIGH (ref 20.0–28.0)
FIO2: 21
O2 Saturation: 86.6 %
Patient temperature: 39.3
pCO2, Ven: 43.1 mmHg — ABNORMAL LOW (ref 44.0–60.0)
pH, Ven: 7.445 — ABNORMAL HIGH (ref 7.250–7.430)
pO2, Ven: 60.4 mmHg — ABNORMAL HIGH (ref 32.0–45.0)

## 2021-01-10 LAB — AMMONIA: Ammonia: 35 umol/L (ref 9–35)

## 2021-01-10 LAB — LIPASE, BLOOD: Lipase: 23 U/L (ref 11–51)

## 2021-01-10 LAB — POC URINE PREG, ED: Preg Test, Ur: NEGATIVE

## 2021-01-10 MED ORDER — AZITHROMYCIN 250 MG PO TABS
500.0000 mg | ORAL_TABLET | Freq: Once | ORAL | Status: AC
  Administered 2021-01-10: 500 mg via ORAL
  Filled 2021-01-10: qty 2

## 2021-01-10 MED ORDER — POTASSIUM CHLORIDE CRYS ER 20 MEQ PO TBCR
40.0000 meq | EXTENDED_RELEASE_TABLET | Freq: Once | ORAL | Status: AC
  Administered 2021-01-10: 40 meq via ORAL
  Filled 2021-01-10: qty 2

## 2021-01-10 MED ORDER — MOLNUPIRAVIR EUA 200MG CAPSULE
4.0000 | ORAL_CAPSULE | Freq: Two times a day (BID) | ORAL | Status: DC
  Administered 2021-01-10: 800 mg via ORAL
  Filled 2021-01-10: qty 4

## 2021-01-10 MED ORDER — LACTATED RINGERS IV BOLUS (SEPSIS)
1000.0000 mL | Freq: Once | INTRAVENOUS | Status: AC
  Administered 2021-01-10: 1000 mL via INTRAVENOUS

## 2021-01-10 MED ORDER — SODIUM CHLORIDE 0.9 % IV SOLN
1.0000 g | Freq: Once | INTRAVENOUS | Status: AC
  Administered 2021-01-10: 1 g via INTRAVENOUS
  Filled 2021-01-10: qty 10

## 2021-01-10 MED ORDER — LACTATED RINGERS IV SOLN: INTRAVENOUS | Status: DC

## 2021-01-10 MED ORDER — ACETAMINOPHEN 325 MG PO TABS
650.0000 mg | ORAL_TABLET | Freq: Once | ORAL | Status: AC
  Administered 2021-01-10: 650 mg via ORAL
  Filled 2021-01-10: qty 2

## 2021-01-10 NOTE — ED Notes (Signed)
Patient transported to CT 

## 2021-01-10 NOTE — Discharge Instructions (Addendum)
Do not be around anyone unless you have a mask on for at least 5 days, longer if needed for persistent symptoms.  Take the medicine for COVID infection, molnupavir, 4 tablets, twice a day until gone, 5 days.  For pain or fever use Tylenol 650 mg every 4 hours.  For cough use Robitussin-DM.  Call your doctor for follow-up instructions as needed.  Return here if you develop severe shortness of breath, weakness or dizziness.

## 2021-01-10 NOTE — ED Triage Notes (Signed)
Patient brought in by EMS for confusion.  Family states that she started to have confusion this morning.  Patient has a hx of UTIs.

## 2021-01-10 NOTE — ED Provider Notes (Signed)
[facility] EMERGENCY DEPARTMENT Provider Note   CSN: [id number] Arrival date & time: 01/10/21  1117     History Chief Complaint  Patient presents with   Altered Mental Status    Katelyn Thompson is a 55 y.o. female.   Altered Mental Status  Patient presents to the ED for evaluation of confusion.  According to the EMS report family stated that the patient became confused this morning.  She apparently has a history of UTIs and were concerned about a recurrent infection.  Patient is confused here in the emergency room and does not really able to tell me why she is here.  She denies any specific complaints.  She denies chest pain or difficulty with her breathing.  She denies any vomiting or diarrhea.  She was not aware she is having any fevers  Past Medical History:  Diagnosis Date   Anxiety    Anxiety and depression    Does not like being around lots of people   Arthritis    Barrett's esophagus without dysplasia    Bipolar 1 disorder (HCC)    COPD (chronic obstructive pulmonary disease) (HCC)    Depression    GERD (gastroesophageal reflux disease)    HTN (hypertension)    Hypercholesteremia    MS (multiple sclerosis) (HCC)    but not offcially disgnosed   Nerve damage    Severe to left foot   OA (osteoarthritis) of knee 01/31/2012   PONV (postoperative nausea and vomiting)     Patient Active Problem List   Diagnosis Date Noted   Lumbosacral spondylosis without myelopathy 09/10/2017   Lumbar facet arthropathy 02/11/2016   Meniscus, lateral, derangement 10/27/2013   Primary osteoarthritis of left knee 10/27/2013   Contusion of right knee 09/23/2013   Derangement of posterior horn of medial meniscus 09/23/2013   Right knee pain 09/23/2013   Effusion of knee joint, left 04/29/2013   Iliotibial band tendonitis 04/29/2013   Bilateral chronic knee pain 02/12/2013   Joint stiffness of knee 09/13/2012   S/P total knee replacement 08/13/2012   Total knee replacement status  07/15/2012   Hypertension 02/20/2012   Effusion of knee joint 01/31/2012   Medial meniscus, posterior horn derangement 01/31/2012   OA (osteoarthritis) of knee 01/31/2012   Acute exacerbation of chronic obstructive pulmonary disease (COPD) (HCC) 09/22/2011   Tobacco abuse 09/22/2011   Rotator cuff syndrome of left shoulder 01/05/2011   Herniated disc, cervical 01/05/2011   Barrett's esophagus without dysplasia 09/26/2010   Constipation, chronic 09/26/2010   Rectal bleeding 09/26/2010   Esophageal dysphagia 06/13/2010   ABDOMINAL PAIN-EPIGASTRIC 06/13/2010    Past Surgical History:  Procedure Laterality Date   ABDOMINAL HYSTERECTOMY     ANKLE ARTHROSCOPY WITH DRILLING/MICROFRACTURE  02/26/2012   Procedure: ANKLE ARTHROSCOPY WITH DRILLING/MICROFRACTURE;  Surgeon: Vickki Hearing, MD;  Location: [facility];  Service: Orthopedics;  Laterality: Right;   COLONOSCOPY  11/20/2011   RMR: Friable anorectum; single anal canal hemorrhoidal tag. Otherwise normal rectum and colon   ESOPHAGOGASTRODUODENOSCOPY  06/2010   diagnosed with Barrett's, small hh, esophagus dilated. Next EGD 06/2011   ESOPHAGOGASTRODUODENOSCOPY  Aug 2013   RMR: abnormal distal esophagus consistent with prior dx of SS Barrett;s. s/p dilation. small hiatal hernia. Biopsies: no metaplasia, dysplasia, malignancy. SURVEILLANCE AUG 2016   EXAM UNDER ANESTHESIA WITH MANIPULATION OF KNEE Right 09/16/2012   Procedure: EXAM UNDER ANESTHESIA WITH MANIPULATION OF RIGHT KNEE;  Surgeon: Vickki Hearing, MD;  Location: [facility];  Service: Orthopedics;  Laterality: Right;  FOOT SURGERY     x 2   KNEE ARTHROSCOPY  02/26/2012   Procedure: ARTHROSCOPY KNEE;  Surgeon: Vickki Hearing, MD;  Location: [facility];  Service: Orthopedics;  Laterality: Right;  with three chondroplasties   KNEE ARTHROTOMY Left 11/21/2013   Procedure: KNEE ARTHROSCOPY  WITH MEDIAL, LATERAL, AND PATELLA  FEMORAL CHONDROPLASTY;  Surgeon: Vickki Hearing, MD;  Location: [facility];  Service: Orthopedics;  Laterality: Left;   s/p hysterectomy     SINUS SURGERY WITH INSTATRAK     SURGERY OF LIP     biopsy   TOTAL KNEE ARTHROPLASTY Right 07/01/2012   Procedure: TOTAL KNEE ARTHROPLASTY;  Surgeon: Vickki Hearing, MD;  Location: [facility];  Service: Orthopedics;  Laterality: Right;   TUBAL LIGATION       OB History   No obstetric history on file.     Family History  Problem Relation Age of Onset   Heart disease Mother    Diabetes Mother    Asthma Mother    Lung disease Mother    Arthritis Mother    Allergies Mother    Heart attack Brother 54       deceased   Colon cancer Neg Hx     Social History   Tobacco Use   Smoking status: Former    Packs/day: 2.00    Years: 34.00    Pack years: 68.00    Types: Cigarettes    Quit date: 12/10/2015    Years since quitting: 5.0   Smokeless tobacco: Never   Tobacco comments:    up to 3 ppd  Vaping Use   Vaping Use: Never used  Substance Use Topics   Alcohol use: No    Comment: rarely   Drug use: No    Home Medications Prior to Admission medications   Medication Sig Start Date End Date Taking? Authorizing Provider  albuterol (PROVENTIL HFA;VENTOLIN HFA) 108 (90 BASE) MCG/ACT inhaler Inhale 2 puffs into the lungs every 6 (six) hours as needed. 12/30/13 12/30/14  [provider]  ALPRAZolam Prudy Feeler) 1 MG tablet Take 1 mg by mouth 3 (three) times daily as needed for anxiety.    [provider]  amitriptyline (ELAVIL) 25 MG tablet Take 25 mg by mouth at bedtime.    [provider]  amLODipine (NORVASC) 10 MG tablet Take 10 mg by mouth daily. 08/31/14   [provider]  chlorthalidone (HYGROTON) 25 MG tablet Take 1/2 (one-half) tablet by mouth once daily 06/28/20   [provider]  cloNIDine (CATAPRES) 0.2 MG tablet Take 0.2 mg by mouth 2 (two) times daily. 06/28/20   [provider]  dexlansoprazole (DEXILANT) 60 MG capsule Take 1 capsule (60 mg total) by mouth  daily. 03/21/12   Gelene Mink, NP  dextromethorphan-guaiFENesin [facility] DM) 30-600 MG per 12 hr tablet Take 1 tablet by mouth every 12 (twelve) hours.    [provider]  EPITOL 200 MG tablet Take 200 mg by mouth 2 (two) times daily. 08/31/14   [provider]  escitalopram (LEXAPRO) 20 MG tablet Take 20 mg by mouth daily. 11/17/19   [provider]  gabapentin (NEURONTIN) 600 MG tablet Take 1 tablet (600 mg total) by mouth 3 (three) times daily. 01/03/18   Kirsteins, Victorino Sparrow, MD  hydrochlorothiazide (MICROZIDE) 12.5 MG capsule Take 1 capsule by mouth daily. 06/02/14   [provider]  lamoTRIgine (LAMICTAL) 100 MG tablet Take 100 mg by mouth every morning.  [provider]  Linaclotide 290 MCG CAPS Take 1 capsule by mouth daily. 30 minutes before a meal 03/21/12   Gelene Mink, NP  lisinopril (PRINIVIL,ZESTRIL) 10 MG tablet Take 20 mg by mouth every morning. 08/31/10   [provider]  lisinopril (ZESTRIL) 40 MG tablet Take 1 tablet by mouth daily. 11/17/19   [provider]  meloxicam (MOBIC) 15 MG tablet Take 15 mg by mouth daily. 10/15/19   [provider]  metoprolol succinate (TOPROL-XL) 100 MG 24 hr tablet Take 100 mg by mouth daily. 09/05/19   [provider]  mometasone-formoterol (DULERA) 200-5 MCG/ACT AERO Inhale 2 puffs into the lungs 2 (two) times daily.    [provider]  omeprazole (PRILOSEC) 40 MG capsule Take 40 mg by mouth 2 (two) times daily. 08/31/14   [provider]  pantoprazole (PROTONIX) 40 MG tablet Take 1 tablet (40 mg total) by mouth 2 (two) times daily. 05/29/13   Gelene Mink, NP  QUEtiapine (SEROQUEL) 25 MG tablet Take 50 mg by mouth at bedtime.  11/04/13   [provider]  senna-docusate (SENOKOT-S) 8.6-50 MG per tablet Take 1 tablet by mouth at bedtime as needed. 07/04/12   Vickki Hearing, MD  SEROQUEL XR 50 MG TB24 Take 50 mg by mouth at bedtime. 08/28/10    [provider]  tiZANidine (ZANAFLEX) 4 MG tablet Take 1 tablet by mouth twice daily 01/04/21   Kirsteins, Victorino Sparrow, MD  traMADol (ULTRAM) 50 MG tablet TAKE 1 TABLET BY MOUTH EVERY 8 HOURS AS NEEDED 12/23/20   Kirsteins, Victorino Sparrow, MD  traZODone (DESYREL) 150 MG tablet Take 300 mg by mouth at bedtime. 08/25/10   [provider]    Allergies    Aspirin, Hydrochlorothiazide, Nabumetone, Nitrofurantoin, and Red dye  Review of Systems   Review of Systems  All other systems reviewed and are negative.  Physical Exam Updated Vital Signs BP (!) 155/78 (BP Location: Right Arm)   Pulse 65   Temp 99.3 F (37.4 C) (Oral)   Resp 20   Ht 1.626 m (5\' 4" )   Wt 99.8 kg   SpO2 97%   BMI 37.76 kg/m   Physical Exam Vitals and nursing note reviewed.  Constitutional:      Appearance: She is well-developed. She is not diaphoretic.  HENT:     Head: Normocephalic and atraumatic.     Right Ear: External ear normal.     Left Ear: External ear normal.  Eyes:     General: No scleral icterus.       Right eye: No discharge.        Left eye: No discharge.     Conjunctiva/sclera: Conjunctivae normal.  Neck:     Trachea: No tracheal deviation.  Cardiovascular:     Rate and Rhythm: Normal rate and regular rhythm.  Pulmonary:     Effort: Pulmonary effort is normal. No respiratory distress.     Breath sounds: Normal breath sounds. No stridor. No wheezing or rales.  Abdominal:     General: Bowel sounds are normal. There is no distension.     Palpations: Abdomen is soft.     Tenderness: There is no abdominal tenderness. There is no guarding or rebound.  Musculoskeletal:        General: No tenderness or deformity.     Cervical back: Full passive range of motion without pain, normal range of motion and neck supple.  Skin:    General: Skin is  warm and dry.     Findings: No rash.  Neurological:     General: No focal deficit present.     Mental Status: She is alert.     GCS: GCS eye  subscore is 4. GCS verbal subscore is 4. GCS motor subscore is 6.     Cranial Nerves: No cranial nerve deficit (no facial droop, extraocular movements intact, no slurred speech).     Sensory: No sensory deficit.     Motor: No abnormal muscle tone or seizure activity.     Coordination: Coordination normal.     Comments: Confused speech pattern, some of her statements are nonsensical, does follow commands and otherwise answer questions when asked  Psychiatric:        Mood and Affect: Mood normal.    ED Results / Procedures / Treatments   Labs (all labs ordered are listed, but only abnormal results are displayed) Labs Reviewed  RESP PANEL BY RT-PCR (FLU A&B, COVID) ARPGX2 - Abnormal; Notable for the following components:      Result Value   SARS Coronavirus 2 by RT PCR POSITIVE (*)    All other components within normal limits  COMPREHENSIVE METABOLIC PANEL - Abnormal; Notable for the following components:   Sodium 129 (*)    Potassium 2.9 (*)    Chloride 90 (*)    Glucose, Bld 124 (*)    Calcium 8.6 (*)    AST 77 (*)    ALT 48 (*)    All other components within normal limits  CBC WITH DIFFERENTIAL/PLATELET - Abnormal; Notable for the following components:   RDW 11.4 (*)    All other components within normal limits  URINALYSIS, ROUTINE W REFLEX MICROSCOPIC - Abnormal; Notable for the following components:   APPearance HAZY (*)    Hgb urine dipstick SMALL (*)    Ketones, ur 5 (*)    Bacteria, UA RARE (*)    All other components within normal limits  BLOOD GAS, VENOUS - Abnormal; Notable for the following components:   pH, Ven 7.445 (*)    pCO2, Ven 43.1 (*)    pO2, Ven 60.4 (*)    Bicarbonate 28.6 (*)    Acid-Base Excess 4.8 (*)    All other components within normal limits  CULTURE, BLOOD (SINGLE)  URINE CULTURE  LACTIC ACID, PLASMA  LACTIC ACID, PLASMA  PROTIME-INR  APTT  AMMONIA  LIPASE, BLOOD  POC URINE PREG, ED    EKG EKG Interpretation  Date/Time:  Monday  January 10 2021 12:23:30 EDT Ventricular Rate:  61 PR Interval:  140 QRS Duration: 88 QT Interval:  390 QTC Calculation: 393 R Axis:   26 Text Interpretation: Sinus rhythm Consider right atrial enlargement Borderline repol abnrm, anterolateral leads No significant change since last tracing Confirmed by Linwood Dibbles [phone]) on 01/10/2021 1:28:33 PM  Radiology CT Head Wo Contrast  Result Date: 01/10/2021 CLINICAL DATA:  Mental status change, unknown cause EXAM: CT HEAD WITHOUT CONTRAST TECHNIQUE: Contiguous axial images were obtained from the base of the skull through the vertex without intravenous contrast. COMPARISON:  02/15/2010 FINDINGS: Brain: No evidence of acute intracranial hemorrhage or extra-axial collection.No evidence of mass lesion/concern mass effect.The ventricles are normal in size. Vascular: No hyperdense vessel or unexpected calcification. Skull: No acute fracture. Unchanged chronic mild widening of a right posterior parietal suture. Sinuses/Orbits: No acute finding. 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 IMPRESSION: No acute intracranial abnormality. Electronically Signed   By: Erma Heritage.D.  On: 01/10/2021 14:15   DG Chest Port 1 View  Result Date: 01/10/2021 CLINICAL DATA:  Confusion.  Possible sepsis. EXAM: PORTABLE CHEST 1 VIEW COMPARISON:  09/04/2014 FINDINGS: Extensive artifact overlies the chest. Heart size is normal. The upper lungs are clear. Cannot rule out basilar infiltrate given the overlying artifact and technical features of the film. No visible effusion. IMPRESSION: Overlying artifact. Upper lungs clear. Cannot rule out basilar infiltrate, as described above. Electronically Signed   By: Paulina Fusi M.D.   On: 01/10/2021 12:44    Procedures Procedures   Medications Ordered in ED Medications  lactated ringers infusion (0 mLs Intravenous Stopped 01/10/21 1939)  molnupiravir EUA (LAGEVRIO) capsule 800 mg (800 mg Oral Given 01/10/21 1935)   lactated ringers bolus 1,000 mL (0 mLs Intravenous Stopped 01/10/21 1409)  cefTRIAXone (ROCEPHIN) 1 g in sodium chloride 0.9 % 100 mL IVPB (0 g Intravenous Stopped 01/10/21 1306)  acetaminophen (TYLENOL) tablet 650 mg (650 mg Oral Given 01/10/21 1221)  potassium chloride SA (KLOR-CON) CR tablet 40 mEq (40 mEq Oral Given 01/10/21 1524)  azithromycin (ZITHROMAX) tablet 500 mg (500 mg Oral Given 01/10/21 1639)    ED Course  I have reviewed the triage vital signs and the nursing notes.  Pertinent labs & imaging results that were available during my care of the patient were reviewed by me and considered in my medical decision making (see chart for details).  Clinical Course as of 01/10/21 1959  Mon Jan 10, 2021  1205 PDMP reviewed.  Monthly prescriptions of alprazolam and tramadol [JK]  1327 Venous blood gas without signs of respiratory acidosis [JK]  1327 Patient lactic acid level normal [JK]  1327 CBC normal [JK]  1328 Chest x-ray cannot rule out basilar infiltrate [JK]  1454 FIO2: 21.00 [JK]  1502 Pt more awake and alert now.  Fever has decreased.   Suspect some of the lethargy may have been related to the fever. [JK]    Clinical Course User Index [JK] Linwood Dibbles, MD   MDM Rules/Calculators/A&P                           Pt presented with confusion, fever.  Sx improved as fever decreased.  No uti, resp acidosis, or signs of sepsis.  Head ct negative for acute findings.  Doubt meningitis.  CXR suggests pna.  Pt given iv abx.  Ua without uti.  COvid test pending at shift change.  COvid test was positive.  No hypoxia.  Stable for dc.  Dr Effie Shy discussed outpt treatment with pt.  Candidate for molnupiravir. Final Clinical Impression(s) / ED Diagnoses Final diagnoses:  Hypokalemia  COVID-19 virus infection    Rx / DC Orders ED Discharge Orders     None        Linwood Dibbles, MD 01/10/21 2000

## 2021-01-10 NOTE — ED Notes (Signed)
Put a pure wick on pt. 

## 2021-01-10 NOTE — ED Provider Notes (Signed)
3:50 PM-checkout from Dr. Lynelle Doctor to evaluate patient after return of COVID testing.  She has been previously diagnosed with a pneumonia, and empiric antibiotics begun.  She presented with confusion.  She was screened for UTI.  Vital signs -significant fever at 102.7 on arrival, with elevated blood pressure.  Oxygenation normal here on room air.  The patient has not been vaccinated against COVID.  5:25 PM-oxygenation 97% on room air, currently.  Nontoxic appearance.  She began to be ill yesterday.  Husband is with her.  I have consulted pharmacy for Paxlovid treatment recommendation.  She is on numerous medications.  Since she is on carbamazepine, which is an inducer, she cannot be treated with Paxlovid.  Pharmacy was able to obtain molnupiravir, which was started in the ED.  MDM-patient with acute illness, consistent with COVID infection.  Doubt bacterial infection.  Vital signs and oxygenation are reassuring.  She can be treated as outpatient.  Of note she has not vaccinated against COVID so she will likely have a tougher course.  She will be instructed to isolate, treat symptoms and take the medicine as directed after leaving here.  She is given a full treatment course of molnuoavir.   Mancel Bale, MD 01/10/21 1946

## 2021-01-12 ENCOUNTER — Other Ambulatory Visit: Payer: Self-pay

## 2021-01-12 ENCOUNTER — Emergency Department (HOSPITAL_COMMUNITY)
Admission: EM | Admit: 2021-01-12 | Discharge: 2021-01-12 | Disposition: A | Discharge status: Home / Self Care | Payer: Medicare Other | Attending: Emergency Medicine | Admitting: Emergency Medicine

## 2021-01-12 ENCOUNTER — Encounter (HOSPITAL_COMMUNITY): Payer: Self-pay | Admitting: Emergency Medicine

## 2021-01-12 ENCOUNTER — Emergency Department (HOSPITAL_COMMUNITY): Payer: Medicare Other

## 2021-01-12 DIAGNOSIS — Z7951 Long term (current) use of inhaled steroids: Secondary | ICD-10-CM | POA: Diagnosis not present

## 2021-01-12 DIAGNOSIS — Z28310 Unvaccinated for covid-19: Secondary | ICD-10-CM | POA: Insufficient documentation

## 2021-01-12 DIAGNOSIS — U071 COVID-19: Secondary | ICD-10-CM | POA: Insufficient documentation

## 2021-01-12 DIAGNOSIS — I10 Essential (primary) hypertension: Secondary | ICD-10-CM | POA: Diagnosis not present

## 2021-01-12 DIAGNOSIS — J441 Chronic obstructive pulmonary disease with (acute) exacerbation: Secondary | ICD-10-CM | POA: Insufficient documentation

## 2021-01-12 DIAGNOSIS — E876 Hypokalemia: Secondary | ICD-10-CM

## 2021-01-12 DIAGNOSIS — Z79899 Other long term (current) drug therapy: Secondary | ICD-10-CM | POA: Diagnosis not present

## 2021-01-12 DIAGNOSIS — Z87891 Personal history of nicotine dependence: Secondary | ICD-10-CM | POA: Insufficient documentation

## 2021-01-12 DIAGNOSIS — R1011 Right upper quadrant pain: Secondary | ICD-10-CM | POA: Diagnosis not present

## 2021-01-12 DIAGNOSIS — R112 Nausea with vomiting, unspecified: Secondary | ICD-10-CM

## 2021-01-12 DIAGNOSIS — R7989 Other specified abnormal findings of blood chemistry: Secondary | ICD-10-CM

## 2021-01-12 DIAGNOSIS — R52 Pain, unspecified: Secondary | ICD-10-CM

## 2021-01-12 DIAGNOSIS — Z96651 Presence of right artificial knee joint: Secondary | ICD-10-CM | POA: Diagnosis not present

## 2021-01-12 DIAGNOSIS — R059 Cough, unspecified: Secondary | ICD-10-CM | POA: Diagnosis present

## 2021-01-12 LAB — COMPREHENSIVE METABOLIC PANEL
ALT: 105 U/L — ABNORMAL HIGH (ref 0–44)
AST: 207 U/L — ABNORMAL HIGH (ref 15–41)
Albumin: 4.5 g/dL (ref 3.5–5.0)
Alkaline Phosphatase: 70 U/L (ref 38–126)
Anion gap: 15 (ref 5–15)
BUN: 8 mg/dL (ref 6–20)
CO2: 29 mmol/L (ref 22–32)
Calcium: 9 mg/dL (ref 8.9–10.3)
Chloride: 86 mmol/L — ABNORMAL LOW (ref 98–111)
Creatinine, Ser: 0.62 mg/dL (ref 0.44–1.00)
GFR, Estimated: >60 mL/min (ref >60)
Glucose, Bld: 128 mg/dL — ABNORMAL HIGH (ref 70–99)
Potassium: 3 mmol/L — ABNORMAL LOW (ref 3.5–5.1)
Sodium: 130 mmol/L — ABNORMAL LOW (ref 135–145)
Total Bilirubin: 1 mg/dL (ref 0.3–1.2)
Total Protein: 7.4 g/dL (ref 6.5–8.1)

## 2021-01-12 LAB — CBC
HCT: 43.4 % (ref 36.0–46.0)
Hemoglobin: 15.7 g/dL — ABNORMAL HIGH (ref 12.0–15.0)
MCH: 32.3 pg (ref 26.0–34.0)
MCHC: 36.2 g/dL — ABNORMAL HIGH (ref 30.0–36.0)
MCV: 89.3 fL (ref 80.0–100.0)
Platelets: 195 10*3/uL (ref 150–400)
RBC: 4.86 MIL/uL (ref 3.87–5.11)
RDW: 11.1 % — ABNORMAL LOW (ref 11.5–15.5)
WBC: 5.4 10*3/uL (ref 4.0–10.5)
nRBC: 0 % (ref 0.0–0.2)

## 2021-01-12 LAB — URINE CULTURE: Culture: NO GROWTH

## 2021-01-12 LAB — CARBAMAZEPINE LEVEL, TOTAL: Carbamazepine Lvl: 5.8 ug/mL (ref 4.0–12.0)

## 2021-01-12 LAB — LIPASE, BLOOD: Lipase: 29 U/L (ref 11–51)

## 2021-01-12 MED ORDER — SODIUM CHLORIDE 0.9 % IV SOLN: INTRAVENOUS | Status: DC | PRN

## 2021-01-12 MED ORDER — ONDANSETRON HCL 4 MG/2ML IJ SOLN
4.0000 mg | Freq: Once | INTRAMUSCULAR | Status: AC
  Administered 2021-01-12: 4 mg via INTRAVENOUS
  Filled 2021-01-12: qty 2

## 2021-01-12 MED ORDER — CLONIDINE HCL 0.2 MG PO TABS
0.2000 mg | ORAL_TABLET | Freq: Once | ORAL | Status: AC
  Administered 2021-01-12: 0.2 mg via ORAL
  Filled 2021-01-12: qty 1

## 2021-01-12 MED ORDER — FAMOTIDINE IN NACL 20-0.9 MG/50ML-% IV SOLN: 20.0000 mg | Freq: Once | INTRAVENOUS | Status: DC | PRN

## 2021-01-12 MED ORDER — BEBTELOVIMAB 175 MG/2 ML IV (EUA): 175.0000 mg | Freq: Once | INTRAMUSCULAR | Status: DC

## 2021-01-12 MED ORDER — POTASSIUM CHLORIDE CRYS ER 20 MEQ PO TBCR
40.0000 meq | EXTENDED_RELEASE_TABLET | Freq: Once | ORAL | Status: AC
  Administered 2021-01-12: 40 meq via ORAL
  Filled 2021-01-12: qty 2

## 2021-01-12 MED ORDER — POTASSIUM CHLORIDE CRYS ER 20 MEQ PO TBCR: EXTENDED_RELEASE_TABLET | ORAL | 0 refills | Status: AC

## 2021-01-12 MED ORDER — ONDANSETRON 8 MG PO TBDP: 8.0000 mg | ORAL_TABLET | Freq: Three times a day (TID) | ORAL | 0 refills | Status: AC | PRN

## 2021-01-12 MED ORDER — DIPHENHYDRAMINE HCL 50 MG/ML IJ SOLN: 50.0000 mg | Freq: Once | INTRAMUSCULAR | Status: DC | PRN

## 2021-01-12 MED ORDER — LACTATED RINGERS IV BOLUS
1000.0000 mL | Freq: Once | INTRAVENOUS | Status: AC
  Administered 2021-01-12: 1000 mL via INTRAVENOUS

## 2021-01-12 MED ORDER — AMLODIPINE BESYLATE 5 MG PO TABS
10.0000 mg | ORAL_TABLET | Freq: Once | ORAL | Status: AC
  Administered 2021-01-12: 10 mg via ORAL
  Filled 2021-01-12: qty 2

## 2021-01-12 MED ORDER — ALBUTEROL SULFATE HFA 108 (90 BASE) MCG/ACT IN AERS: 2.0000 | INHALATION_SPRAY | Freq: Once | RESPIRATORY_TRACT | Status: DC | PRN

## 2021-01-12 MED ORDER — EPINEPHRINE 0.3 MG/0.3ML IJ SOAJ: 0.3000 mg | Freq: Once | INTRAMUSCULAR | Status: DC | PRN

## 2021-01-12 MED ORDER — METHYLPREDNISOLONE SODIUM SUCC 125 MG IJ SOLR: 125.0000 mg | Freq: Once | INTRAMUSCULAR | Status: DC | PRN

## 2021-01-12 NOTE — Discharge Instructions (Addendum)
It was our pleasure to provide your ER care today - we hope that you feel better.  Drink adequate/fluids/stay well hydrated.  Take zofran as need for nausea.  Stay active, take full and deep breaths.   From today's labs, your potassium level is low (3) - take potassium supplement as prescribed, eat plenty of fruits and vegetables. Also from the labs, a couple of your liver function tests are high (AST 207, ALT 105).  Follow up with primary care doctor in 1-2 weeks.   See attached covid information.   Return to ER if worse, new symptoms, increased trouble breathing, persistent vomiting, severe abdominal pain, or other concern.

## 2021-01-12 NOTE — ED Provider Notes (Signed)
[facility] EMERGENCY DEPARTMENT Provider Note   CSN: [id number] Arrival date & time: 01/12/21  9417     History Chief Complaint  Patient presents with   Emesis    Katelyn Thompson is a 55 y.o. female.  Patient with recent covid dx 10/3, states symptoms began the day prior to that - re--presents to ED with nausea/vomiting in past. Symptoms acute onset, moderate, recurrent, persistent, not bloody or bilious. No abd pain or distension. 1-2 mildly loose stools, no severe diarrhea. Had  recent fever, none today. +non prod cough. No sob. Was not vaccinated for covid. Denies chest pain or discomfort. No severe headaches. No neck or back pain. No leg pain or swelling.   The history is provided by the patient, the spouse and medical records.  Emesis Associated symptoms: cough and fever   Associated symptoms: no abdominal pain and no sore throat       Past Medical History:  Diagnosis Date   Anxiety    Anxiety and depression    Does not like being around lots of people   Arthritis    Barrett's esophagus without dysplasia    Bipolar 1 disorder (HCC)    COPD (chronic obstructive pulmonary disease) (HCC)    Depression    GERD (gastroesophageal reflux disease)    HTN (hypertension)    Hypercholesteremia    MS (multiple sclerosis) (HCC)    but not offcially disgnosed   Nerve damage    Severe to left foot   OA (osteoarthritis) of knee 01/31/2012   PONV (postoperative nausea and vomiting)     Patient Active Problem List   Diagnosis Date Noted   Lumbosacral spondylosis without myelopathy 09/10/2017   Lumbar facet arthropathy 02/11/2016   Meniscus, lateral, derangement 10/27/2013   Primary osteoarthritis of left knee 10/27/2013   Contusion of right knee 09/23/2013   Derangement of posterior horn of medial meniscus 09/23/2013   Right knee pain 09/23/2013   Effusion of knee joint, left 04/29/2013   Iliotibial band tendonitis 04/29/2013   Bilateral chronic knee pain 02/12/2013    Joint stiffness of knee 09/13/2012   S/P total knee replacement 08/13/2012   Total knee replacement status 07/15/2012   Hypertension 02/20/2012   Effusion of knee joint 01/31/2012   Medial meniscus, posterior horn derangement 01/31/2012   OA (osteoarthritis) of knee 01/31/2012   Acute exacerbation of chronic obstructive pulmonary disease (COPD) (HCC) 09/22/2011   Tobacco abuse 09/22/2011   Rotator cuff syndrome of left shoulder 01/05/2011   Herniated disc, cervical 01/05/2011   Barrett's esophagus without dysplasia 09/26/2010   Constipation, chronic 09/26/2010   Rectal bleeding 09/26/2010   Esophageal dysphagia 06/13/2010   ABDOMINAL PAIN-EPIGASTRIC 06/13/2010    Past Surgical History:  Procedure Laterality Date   ABDOMINAL HYSTERECTOMY     ANKLE ARTHROSCOPY WITH DRILLING/MICROFRACTURE  02/26/2012   Procedure: ANKLE ARTHROSCOPY WITH DRILLING/MICROFRACTURE;  Surgeon: Vickki Hearing, MD;  Location: [facility];  Service: Orthopedics;  Laterality: Right;   COLONOSCOPY  11/20/2011   RMR: Friable anorectum; single anal canal hemorrhoidal tag. Otherwise normal rectum and colon   ESOPHAGOGASTRODUODENOSCOPY  06/2010   diagnosed with Barrett's, small hh, esophagus dilated. Next EGD 06/2011   ESOPHAGOGASTRODUODENOSCOPY  Aug 2013   RMR: abnormal distal esophagus consistent with prior dx of SS Barrett;s. s/p dilation. small hiatal hernia. Biopsies: no metaplasia, dysplasia, malignancy. SURVEILLANCE AUG 2016   EXAM UNDER ANESTHESIA WITH MANIPULATION OF KNEE Right 09/16/2012   Procedure: EXAM UNDER ANESTHESIA WITH MANIPULATION OF RIGHT KNEE;  Surgeon: Vickki Hearing, MD;  Location: [facility];  Service: Orthopedics;  Laterality: Right;   FOOT SURGERY     x 2   KNEE ARTHROSCOPY  02/26/2012   Procedure: ARTHROSCOPY KNEE;  Surgeon: Vickki Hearing, MD;  Location: [facility];  Service: Orthopedics;  Laterality: Right;  with three chondroplasties   KNEE ARTHROTOMY Left 11/21/2013   Procedure: KNEE  ARTHROSCOPY  WITH MEDIAL, LATERAL, AND PATELLA  FEMORAL CHONDROPLASTY;  Surgeon: Vickki Hearing, MD;  Location: [facility];  Service: Orthopedics;  Laterality: Left;   s/p hysterectomy     SINUS SURGERY WITH INSTATRAK     SURGERY OF LIP     biopsy   TOTAL KNEE ARTHROPLASTY Right 07/01/2012   Procedure: TOTAL KNEE ARTHROPLASTY;  Surgeon: Vickki Hearing, MD;  Location: [facility];  Service: Orthopedics;  Laterality: Right;   TUBAL LIGATION       OB History   No obstetric history on file.     Family History  Problem Relation Age of Onset   Heart disease Mother    Diabetes Mother    Asthma Mother    Lung disease Mother    Arthritis Mother    Allergies Mother    Heart attack Brother 50       deceased   Colon cancer Neg Hx     Social History   Tobacco Use   Smoking status: Former    Packs/day: 2.00    Years: 34.00    Pack years: 68.00    Types: Cigarettes    Quit date: 12/10/2015    Years since quitting: 5.0   Smokeless tobacco: Never   Tobacco comments:    up to 3 ppd  Vaping Use   Vaping Use: Never used  Substance Use Topics   Alcohol use: No    Comment: rarely   Drug use: No    Home Medications Prior to Admission medications   Medication Sig Start Date End Date Taking? Authorizing Provider  albuterol (PROVENTIL HFA;VENTOLIN HFA) 108 (90 BASE) MCG/ACT inhaler Inhale 2 puffs into the lungs every 6 (six) hours as needed. 12/30/13 12/30/14  [provider]  ALPRAZolam Prudy Feeler) 1 MG tablet Take 1 mg by mouth 3 (three) times daily as needed for anxiety.    [provider]  amitriptyline (ELAVIL) 25 MG tablet Take 25 mg by mouth at bedtime.    [provider]  amLODipine (NORVASC) 10 MG tablet Take 10 mg by mouth daily. 08/31/14   [provider]  chlorthalidone (HYGROTON) 25 MG tablet Take 1/2 (one-half) tablet by mouth once daily 06/28/20   [provider]  cloNIDine (CATAPRES) 0.2 MG tablet Take 0.2 mg by mouth 2 (two) times  daily. 06/28/20   [provider]  dexlansoprazole (DEXILANT) 60 MG capsule Take 1 capsule (60 mg total) by mouth daily. 03/21/12   Gelene Mink, NP  dextromethorphan-guaiFENesin [facility] DM) 30-600 MG per 12 hr tablet Take 1 tablet by mouth every 12 (twelve) hours.    [provider]  EPITOL 200 MG tablet Take 200 mg by mouth 2 (two) times daily. 08/31/14   [provider]  escitalopram (LEXAPRO) 20 MG tablet Take 20 mg by mouth daily. 11/17/19   [provider]  gabapentin (NEURONTIN) 600 MG tablet Take 1 tablet (600 mg total) by mouth 3 (three) times daily. 01/03/18   Kirsteins, Victorino Sparrow, MD  hydrochlorothiazide (MICROZIDE) 12.5 MG capsule Take 1 capsule by mouth daily. 06/02/14   [provider]  lamoTRIgine (LAMICTAL) 100 MG tablet Take 100 mg by mouth every morning.    [provider]  Linaclotide 290 MCG CAPS Take 1 capsule by mouth daily. 30 minutes before a meal 03/21/12   Gelene Mink, NP  lisinopril (PRINIVIL,ZESTRIL) 10 MG tablet Take 20 mg by mouth every morning. 08/31/10   [provider]  lisinopril (ZESTRIL) 40 MG tablet Take 1 tablet by mouth daily. 11/17/19   [provider]  meloxicam (MOBIC) 15 MG tablet Take 15 mg by mouth daily. 10/15/19   [provider]  metoprolol succinate (TOPROL-XL) 100 MG 24 hr tablet Take 100 mg by mouth daily. 09/05/19   [provider]  mometasone-formoterol (DULERA) 200-5 MCG/ACT AERO Inhale 2 puffs into the lungs 2 (two) times daily.    [provider]  omeprazole (PRILOSEC) 40 MG capsule Take 40 mg by mouth 2 (two) times daily. 08/31/14   [provider]  pantoprazole (PROTONIX) 40 MG tablet Take 1 tablet (40 mg total) by mouth 2 (two) times daily. 05/29/13   Gelene Mink, NP  QUEtiapine (SEROQUEL) 25 MG tablet Take 50 mg by mouth at bedtime.  11/04/13   [provider]  senna-docusate (SENOKOT-S) 8.6-50 MG per tablet Take 1 tablet by mouth at  bedtime as needed. 07/04/12   Vickki Hearing, MD  SEROQUEL XR 50 MG TB24 Take 50 mg by mouth at bedtime. 08/28/10   [provider]  tiZANidine (ZANAFLEX) 4 MG tablet Take 1 tablet by mouth twice daily 01/04/21   Kirsteins, Victorino Sparrow, MD  traMADol (ULTRAM) 50 MG tablet TAKE 1 TABLET BY MOUTH EVERY 8 HOURS AS NEEDED 12/23/20   Kirsteins, Victorino Sparrow, MD  traZODone (DESYREL) 150 MG tablet Take 300 mg by mouth at bedtime. 08/25/10   [provider]    Allergies    Aspirin, Hydrochlorothiazide, Nabumetone, Nitrofurantoin, and Red dye  Review of Systems   Review of Systems  Constitutional:  Positive for fever.  HENT:  Negative for sore throat.   Eyes:  Negative for redness.  Respiratory:  Positive for cough. Negative for shortness of breath.   Cardiovascular:  Negative for chest pain.  Gastrointestinal:  Positive for nausea and vomiting. Negative for abdominal pain and constipation.  Genitourinary:  Negative for dysuria and flank pain.  Musculoskeletal:  Negative for neck pain and neck stiffness.  Skin:  Negative for rash.  Neurological:  Negative for syncope, speech difficulty and numbness.  Hematological:  Does not bruise/bleed easily.  Psychiatric/Behavioral:  Negative for confusion.    Physical Exam Updated Vital Signs BP (!) 212/92 (BP Location: Right Arm)   Pulse (!) 50   Temp 98 F (36.7 C) (Oral)   Resp 17   SpO2 99%   Physical Exam Vitals and nursing note reviewed.  Constitutional:      Appearance: Normal appearance. She is well-developed.  HENT:     Head: Atraumatic.     Nose: Nose normal.     Mouth/Throat:     Mouth: Mucous membranes are moist.  Eyes:     General: No scleral icterus.    Conjunctiva/sclera: Conjunctivae normal.     Pupils: Pupils are equal, round, and reactive to light.  Neck:     Trachea: No tracheal deviation.     Comments: No stiffness or rigidity.  Cardiovascular:     Rate and Rhythm: Normal rate and regular rhythm.      Pulses: Normal pulses.     Heart sounds: Normal  heart sounds. No murmur heard.   No friction rub. No gallop.  Pulmonary:     Effort: Pulmonary effort is normal. No respiratory distress.     Breath sounds: Normal breath sounds.  Abdominal:     General: Bowel sounds are normal. There is no distension.     Palpations: Abdomen is soft. There is no mass.     Tenderness: There is no abdominal tenderness. There is no guarding or rebound.     Hernia: No hernia is present.  Genitourinary:    Comments: No cva tenderness.  Musculoskeletal:        General: No swelling or tenderness.     Cervical back: Normal range of motion and neck supple. No rigidity. No muscular tenderness.     Right lower leg: No edema.     Left lower leg: No edema.  Skin:    General: Skin is warm and dry.     Findings: No rash.  Neurological:     Mental Status: She is alert.     Comments: Alert, speech normal. No dysarthria or aphasia. Motor/sens grossly intact bil. Steady gait.   Psychiatric:        Mood and Affect: Mood normal.    ED Results / Procedures / Treatments   Labs (all labs ordered are listed, but only abnormal results are displayed) Results for orders placed or performed during the hospital encounter of 01/12/21  CBC  Result Value Ref Range   WBC 5.4 4.0 - 10.5 K/uL   RBC 4.86 3.87 - 5.11 MIL/uL   Hemoglobin 15.7 (H) 12.0 - 15.0 g/dL   HCT 93.8 18.2 - 99.3 %   MCV 89.3 80.0 - 100.0 fL   MCH 32.3 26.0 - 34.0 pg   MCHC 36.2 (H) 30.0 - 36.0 g/dL   RDW 71.6 (L) 96.7 - 89.3 %   Platelets 195 150 - 400 K/uL   nRBC 0.0 0.0 - 0.2 %  Comprehensive metabolic panel  Result Value Ref Range   Sodium 130 (L) 135 - 145 mmol/L   Potassium 3.0 (L) 3.5 - 5.1 mmol/L   Chloride 86 (L) 98 - 111 mmol/L   CO2 29 22 - 32 mmol/L   Glucose, Bld 128 (H) 70 - 99 mg/dL   BUN 8 6 - 20 mg/dL   Creatinine, Ser 8.10 0.44 - 1.00 mg/dL   Calcium 9.0 8.9 - 17.5 mg/dL   Total Protein 7.4 6.5 - 8.1 g/dL   Albumin 4.5 3.5 - 5.0  g/dL   AST 102 (H) 15 - 41 U/L   ALT 105 (H) 0 - 44 U/L   Alkaline Phosphatase 70 38 - 126 U/L   Total Bilirubin 1.0 0.3 - 1.2 mg/dL   GFR, Estimated >58 >52 mL/min   Anion gap 15 5 - 15  Lipase, blood  Result Value Ref Range   Lipase 29 11 - 51 U/L  Carbamazepine level, total  Result Value Ref Range   Carbamazepine Lvl 5.8 4.0 - 12.0 ug/mL   CT Head Wo Contrast  Result Date: 01/10/2021 CLINICAL DATA:  Mental status change, unknown cause EXAM: CT HEAD WITHOUT CONTRAST TECHNIQUE: Contiguous axial images were obtained from the base of the skull through the vertex without intravenous contrast. COMPARISON:  02/15/2010 FINDINGS: Brain: No evidence of acute intracranial hemorrhage or extra-axial collection.No evidence of mass lesion/concern mass effect.The ventricles are normal in size. Vascular: No hyperdense vessel or unexpected calcification. Skull: No acute fracture. Unchanged chronic mild widening of a right posterior  parietal suture. Sinuses/Orbits: No acute finding. 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 IMPRESSION: No acute intracranial abnormality. Electronically Signed   By: Caprice Renshaw M.D.   On: 01/10/2021 14:15   DG Chest Port 1 View  Result Date: 01/10/2021 CLINICAL DATA:  Confusion.  Possible sepsis. EXAM: PORTABLE CHEST 1 VIEW COMPARISON:  09/04/2014 FINDINGS: Extensive artifact overlies the chest. Heart size is normal. The upper lungs are clear. Cannot rule out basilar infiltrate given the overlying artifact and technical features of the film. No visible effusion. IMPRESSION: Overlying artifact. Upper lungs clear. Cannot rule out basilar infiltrate, as described above. Electronically Signed   By: Paulina Fusi M.D.   On: 01/10/2021 12:44    EKG None  Radiology CT Head Wo Contrast  Result Date: 01/10/2021 CLINICAL DATA:  Mental status change, unknown cause EXAM: CT HEAD WITHOUT CONTRAST TECHNIQUE: Contiguous axial images were obtained from the base of the  skull through the vertex without intravenous contrast. COMPARISON:  02/15/2010 FINDINGS: Brain: No evidence of acute intracranial hemorrhage or extra-axial collection.No evidence of mass lesion/concern mass effect.The ventricles are normal in size. Vascular: No hyperdense vessel or unexpected calcification. Skull: No acute fracture. Unchanged chronic mild widening of a right posterior parietal suture. Sinuses/Orbits: No acute finding. 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 IMPRESSION: No acute intracranial abnormality. Electronically Signed   By: Caprice Renshaw M.D.   On: 01/10/2021 14:15   US Abdomen Limited RUQ (LIVER/GB)  Result Date: 01/12/2021 CLINICAL DATA:  Right upper quadrant abdominal pain with vomiting for 2 days. COVID positive. EXAM: ULTRASOUND ABDOMEN LIMITED RIGHT UPPER QUADRANT COMPARISON:  None. FINDINGS: Gallbladder: No gallstones or wall thickening visualized. No sonographic Murphy sign noted by sonographer. Common bile duct: Diameter: 5 mm Liver: Mildly increased hepatic echogenicity without focal abnormality. Portal vein is patent on color Doppler imaging with normal direction of blood flow towards the liver. Other: Study is mildly limited by bowel gas and body habitus. IMPRESSION: No acute right upper quadrant abdominal findings identified. Mildly increased hepatic echogenicity, as can be seen with steatosis. Electronically Signed   By: Carey Bullocks M.D.   On: 01/12/2021 12:21    Procedures Procedures   Medications Ordered in ED Medications  0.9 %  sodium chloride infusion (has no administration in time range)  bebtelovimab EUA injection SOLN 175 mg (has no administration in time range)  diphenhydrAMINE (BENADRYL) injection 50 mg (has no administration in time range)  famotidine (PEPCID) IVPB 20 mg premix (has no administration in time range)  methylPREDNISolone sodium succinate (SOLU-MEDROL) 125 mg/2 mL injection 125 mg (has no administration in time range)   albuterol (VENTOLIN HFA) 108 (90 Base) MCG/ACT inhaler 2 puff (has no administration in time range)  EPINEPHrine (EPI-PEN) injection 0.3 mg (has no administration in time range)  lactated ringers bolus 1,000 mL (has no administration in time range)  ondansetron (ZOFRAN) injection 4 mg (has no administration in time range)    ED Course  I have reviewed the triage vital signs and the nursing notes.  Pertinent labs & imaging results that were available during my care of the patient were reviewed by me and considered in my medical decision making (see chart for details).    MDM Rules/Calculators/A&P                           Iv LR bolus. Zofran iv. Labs sent.   Reviewed nursing notes and prior charts for  additional history.   As symptoms began 3-4 days ago, not vaccinated, offered monoclonal ab therapy - pt agreeable - dose given iv.   Katelyn Thompson was evaluated in Emergency Department on 01/12/2021 for the symptoms described in the history of present illness. She was evaluated in the context of the global COVID-19 pandemic, which necessitated consideration that the patient might be at risk for infection with the SARS-CoV-2 virus that causes COVID-19. Institutional protocols and algorithms that pertain to the evaluation of patients at risk for COVID-19 are in a state of rapid change based on information released by regulatory bodies including the CDC and federal and state organizations. These policies and algorithms were followed during the patient's care in the ED.  Trial of po fluids.   Labs reviewed/interpreted by me - k low. Kcl po. AST mildly high > ALT. Pt denies heavy etoh use. Denies chol med. ?mild upper abd tenderness. Denies hx gallstones. Will get u/s.   Recheck abd soft non tender. No recurrent emesis.   Pt currently appears stable for d/c.   Return precautions provided.    Final Clinical Impression(s) / ED Diagnoses Final diagnoses:  None    Rx / DC Orders ED  Discharge Orders     None        Cathren Laine, MD 01/12/21 1308

## 2021-01-12 NOTE — ED Notes (Signed)
Pt tolerating water and crackers without emesis

## 2021-01-12 NOTE — ED Notes (Signed)
Pt given water and crackers  

## 2021-01-12 NOTE — ED Triage Notes (Signed)
Pt C/O emesis and headache x 2 days. Pt was COVID + on Monday.

## 2021-01-15 LAB — CULTURE, BLOOD (SINGLE): Culture: NO GROWTH

## 2021-01-21 ENCOUNTER — Encounter: Payer: Medicare Other | Admitting: Physical Medicine & Rehabilitation

## 2021-01-25 ENCOUNTER — Encounter: Payer: Medicare Other | Attending: Physical Medicine & Rehabilitation | Admitting: Physical Medicine & Rehabilitation

## 2021-01-25 ENCOUNTER — Other Ambulatory Visit: Payer: Self-pay

## 2021-01-25 ENCOUNTER — Encounter: Payer: Self-pay | Admitting: Physical Medicine & Rehabilitation

## 2021-01-25 VITALS — BP 156/83 | HR 72 | Temp 97.6°F | Ht 64.0 in | Wt 231.4 lb

## 2021-01-25 DIAGNOSIS — M47817 Spondylosis without myelopathy or radiculopathy, lumbosacral region: Secondary | ICD-10-CM | POA: Diagnosis not present

## 2021-01-25 MED ORDER — TRAMADOL HCL 50 MG PO TABS: 50.0000 mg | ORAL_TABLET | Freq: Three times a day (TID) | ORAL | 5 refills | Status: DC | PRN

## 2021-01-25 MED ORDER — TIZANIDINE HCL 4 MG PO TABS: 4.0000 mg | ORAL_TABLET | Freq: Two times a day (BID) | ORAL | 5 refills | Status: DC

## 2021-01-25 NOTE — Progress Notes (Signed)
Subjective:    Patient ID: Katelyn Thompson, female    DOB: Jan 09, 1966, 55 y.o.   MRN: [id number]  HPI Chief complaint right-sided lumbar pain 55 year old female with history of lumbar spondylosis without myelopathy who returns today for follow-up of chronic low back pain.  She indicates right-sided lumbar pain has been increasing over the last couple weeks.  She has had no falls or trauma.  She feels like the location is the same as prior lumbar pain which occurred before for radiofrequency ablation.  Patient underwent a right L3-L4 medial branch and right L5 dorsal ramus radiofrequency ablation on 08/13/2020, she underwent left L3-L4 medial branch and left L5 dorsal ramus radiofrequency ablation on 09/21/2020.  The left side is still without significant pain.  The patient still takes tramadol 50 mg 3 times daily as well as tizanidine 4 mg twice daily and this is helpful in alleviating some of her pain. Interval medical history she had contracted COVID-19 around 2 weeks ago.  She is still having some coughing.  She did not require hospitalization but she did have 2 ED visits. Pain Inventory Average Pain 7 Pain Right Now 7 My pain is sharp, stabbing, and aching  In the last 24 hours, has pain interfered with the following? General activity 7 Relation with others 3 Enjoyment of life 7 What TIME of day is your pain at its worst? night Sleep (in general) Poor  Pain is worse with: walking, bending, and standing Pain improves with: rest and medication Relief from Meds:  not answered  Family History  Problem Relation Age of Onset   Heart disease Mother    Diabetes Mother    Asthma Mother    Lung disease Mother    Arthritis Mother    Allergies Mother    Heart attack Brother 16       deceased   Colon cancer Neg Hx    Social History   Socioeconomic History   Marital status: Married    Spouse name: Not on file   Number of children: Not on file   Years of education: 9th grade   Highest  education level: Not on file  Occupational History   Occupation: disabled    Employer: NOT EMPLOYED  Tobacco Use   Smoking status: Former    Packs/day: 2.00    Years: 34.00    Pack years: 68.00    Types: Cigarettes    Quit date: 12/10/2015    Years since quitting: 5.1   Smokeless tobacco: Never   Tobacco comments:    up to 3 ppd  Vaping Use   Vaping Use: Never used  Substance and Sexual Activity   Alcohol use: No    Comment: rarely   Drug use: No   Sexual activity: Yes    Birth control/protection: Surgical  Other Topics Concern   Not on file  Social History Narrative   Not on file   Social Determinants of Health   Financial Resource Strain: Not on file  Food Insecurity: Not on file  Transportation Needs: Not on file  Physical Activity: Not on file  Stress: Not on file  Social Connections: Not on file   Past Surgical History:  Procedure Laterality Date   ABDOMINAL HYSTERECTOMY     ANKLE ARTHROSCOPY WITH DRILLING/MICROFRACTURE  02/26/2012   Procedure: ANKLE ARTHROSCOPY WITH DRILLING/MICROFRACTURE;  Surgeon: Vickki Hearing, MD;  Location: [facility];  Service: Orthopedics;  Laterality: Right;   COLONOSCOPY  11/20/2011   RMR: Friable anorectum; single  anal canal hemorrhoidal tag. Otherwise normal rectum and colon   ESOPHAGOGASTRODUODENOSCOPY  06/2010   diagnosed with Barrett's, small hh, esophagus dilated. Next EGD 06/2011   ESOPHAGOGASTRODUODENOSCOPY  Aug 2013   RMR: abnormal distal esophagus consistent with prior dx of SS Barrett;s. s/p dilation. small hiatal hernia. Biopsies: no metaplasia, dysplasia, malignancy. SURVEILLANCE AUG 2016   EXAM UNDER ANESTHESIA WITH MANIPULATION OF KNEE Right 09/16/2012   Procedure: EXAM UNDER ANESTHESIA WITH MANIPULATION OF RIGHT KNEE;  Surgeon: Vickki Hearing, MD;  Location: [facility];  Service: Orthopedics;  Laterality: Right;   FOOT SURGERY     x 2   KNEE ARTHROSCOPY  02/26/2012   Procedure: ARTHROSCOPY KNEE;  Surgeon: Vickki Hearing, MD;  Location: [facility];  Service: Orthopedics;  Laterality: Right;  with three chondroplasties   KNEE ARTHROTOMY Left 11/21/2013   Procedure: KNEE ARTHROSCOPY  WITH MEDIAL, LATERAL, AND PATELLA  FEMORAL CHONDROPLASTY;  Surgeon: Vickki Hearing, MD;  Location: [facility];  Service: Orthopedics;  Laterality: Left;   s/p hysterectomy     SINUS SURGERY WITH INSTATRAK     SURGERY OF LIP     biopsy   TOTAL KNEE ARTHROPLASTY Right 07/01/2012   Procedure: TOTAL KNEE ARTHROPLASTY;  Surgeon: Vickki Hearing, MD;  Location: [facility];  Service: Orthopedics;  Laterality: Right;   TUBAL LIGATION     Past Surgical History:  Procedure Laterality Date   ABDOMINAL HYSTERECTOMY     ANKLE ARTHROSCOPY WITH DRILLING/MICROFRACTURE  02/26/2012   Procedure: ANKLE ARTHROSCOPY WITH DRILLING/MICROFRACTURE;  Surgeon: Vickki Hearing, MD;  Location: [facility];  Service: Orthopedics;  Laterality: Right;   COLONOSCOPY  11/20/2011   RMR: Friable anorectum; single anal canal hemorrhoidal tag. Otherwise normal rectum and colon   ESOPHAGOGASTRODUODENOSCOPY  06/2010   diagnosed with Barrett's, small hh, esophagus dilated. Next EGD 06/2011   ESOPHAGOGASTRODUODENOSCOPY  Aug 2013   RMR: abnormal distal esophagus consistent with prior dx of SS Barrett;s. s/p dilation. small hiatal hernia. Biopsies: no metaplasia, dysplasia, malignancy. SURVEILLANCE AUG 2016   EXAM UNDER ANESTHESIA WITH MANIPULATION OF KNEE Right 09/16/2012   Procedure: EXAM UNDER ANESTHESIA WITH MANIPULATION OF RIGHT KNEE;  Surgeon: Vickki Hearing, MD;  Location: [facility];  Service: Orthopedics;  Laterality: Right;   FOOT SURGERY     x 2   KNEE ARTHROSCOPY  02/26/2012   Procedure: ARTHROSCOPY KNEE;  Surgeon: Vickki Hearing, MD;  Location: [facility];  Service: Orthopedics;  Laterality: Right;  with three chondroplasties   KNEE ARTHROTOMY Left 11/21/2013   Procedure: KNEE ARTHROSCOPY  WITH MEDIAL, LATERAL, AND PATELLA  FEMORAL CHONDROPLASTY;  Surgeon: Vickki Hearing, MD;  Location: [facility];  Service: Orthopedics;  Laterality: Left;   s/p hysterectomy     SINUS SURGERY WITH INSTATRAK     SURGERY OF LIP     biopsy   TOTAL KNEE ARTHROPLASTY Right 07/01/2012   Procedure: TOTAL KNEE ARTHROPLASTY;  Surgeon: Vickki Hearing, MD;  Location: [facility];  Service: Orthopedics;  Laterality: Right;   TUBAL LIGATION     Past Medical History:  Diagnosis Date   Anxiety    Anxiety and depression    Does not like being around lots of people   Arthritis    Barrett's esophagus without dysplasia    Bipolar 1 disorder (HCC)    COPD (chronic obstructive pulmonary disease) (HCC)    Depression    GERD (gastroesophageal reflux disease)    HTN (hypertension)    Hypercholesteremia  MS (multiple sclerosis) (HCC)    but not offcially disgnosed   Nerve damage    Severe to left foot   OA (osteoarthritis) of knee 01/31/2012   PONV (postoperative nausea and vomiting)    BP (!) 156/83   Pulse 72   Temp 97.6 F (36.4 C)   Ht 5\' 4"  (1.626 m)   Wt 231 lb 6.4 oz (105 kg)   BMI 39.72 kg/m   Opioid Risk Score:   Fall Risk Score:  `1  Depression screen PHQ 2/9  Depression screen [facility] 2/9 01/25/2021 08/13/2020 07/13/2020 03/12/2020 02/06/2020 11/20/2019 08/19/2019  Decreased Interest 1 1 1  0 1 1 1   Down, Depressed, Hopeless 1 1 1  0 1 1 1   PHQ - 2 Score 2 2 2  0 2 2 2   Altered sleeping - - - - - - -  Tired, decreased energy - - - - - - -  Change in appetite - - - - - - -  Feeling bad or failure about yourself  - - - - - - -  Trouble concentrating - - - - - - -  Moving slowly or fidgety/restless - - - - - - -  Suicidal thoughts - - - - - - -  PHQ-9 Score - - - - - - -  Some recent data might be hidden     Review of Systems  Constitutional: Negative.   HENT: Negative.    Eyes: Negative.   Respiratory: Negative.    Cardiovascular: Negative.   Gastrointestinal: Negative.   Endocrine: Negative.   Genitourinary: Negative.   Musculoskeletal:  Positive for back  pain.  Skin: Negative.   Allergic/Immunologic: Negative.   Neurological: Negative.   Hematological: Negative.   Psychiatric/Behavioral:  Positive for dysphoric mood.   All other systems reviewed and are negative.     Objective:   Physical Exam Constitutional:      Appearance: She is obese.  HENT:     Head: Normocephalic and atraumatic.  Eyes:     Extraocular Movements: Extraocular movements intact.     Conjunctiva/sclera: Conjunctivae normal.     Pupils: Pupils are equal, round, and reactive to light.  Musculoskeletal:     Comments: Tenderness palpation right lumbar paraspinal  No evidence of scoliosis No tenderness on the left side No pain with hip knee or ankle range of motion. Gait without evidence of toe drag or knee instability no assistive device for  Skin:    General: Skin is warm and dry.  Neurological:     Mental Status: She is alert and oriented to person, place, and time.     Comments: Motor strength is 5/5 bilateral hip flexor knee extensor ankle dorsiflexor Negative straight leg raising bilaterally.  Psychiatric:        Mood and Affect: Mood normal.        Behavior: Behavior normal.          Assessment & Plan:  1.  Right-sided lumbar pain coincides with her right lumbar spondylosis at L4-5 and L5-S1.  She is 5 months post radiofrequency neurotomy of the L3 and L4 medial branch as well as L5 dorsal ramus which is new nerves applied to these joints.  We discussed that repeating this procedure in 1 month should help alleviate the recurrent pain. Tramadol 50mg  Q8h prescription renewed with refills  Tizanidine 4mg  BID prescription renewed with refills

## 2021-03-18 ENCOUNTER — Other Ambulatory Visit: Payer: Self-pay

## 2021-03-18 ENCOUNTER — Encounter: Payer: Medicare Other | Attending: Physical Medicine & Rehabilitation | Admitting: Physical Medicine & Rehabilitation

## 2021-03-18 ENCOUNTER — Encounter: Payer: Self-pay | Admitting: Physical Medicine & Rehabilitation

## 2021-03-18 VITALS — BP 129/85 | HR 76 | Temp 98.3°F | Ht 64.0 in | Wt 233.2 lb

## 2021-03-18 DIAGNOSIS — M47817 Spondylosis without myelopathy or radiculopathy, lumbosacral region: Secondary | ICD-10-CM

## 2021-03-18 NOTE — Patient Instructions (Signed)
You had a radio frequency procedure today This was done to alleviate joint pain in your lumbar area We injected lidocaine which is a local anesthetic.  You may experience soreness at the injection sites. You may also experienced some irritation of the nerves that were heated I'm recommending ice for 30 minutes every 2 hours as needed for the next 24-48 hours   

## 2021-03-18 NOTE — Progress Notes (Signed)
  PROCEDURE RECORD [name]   Name: Katelyn Thompson DOB:09/27/1965 MRN: [id number]  Date:03/18/2021  Physician: Claudette Laws, MD    Nurse/CMA: Charise Carwin MA  Allergies:  Allergies  Allergen Reactions   Aspirin Other (See Comments)    esophagus disease heartburn   Hydrochlorothiazide     Other reaction(s): Other Hyponatremia   Nabumetone Nausea And Vomiting and Other (See Comments)   Nitrofurantoin Nausea And Vomiting   Red Dye Rash    Kdc:red Dye+yellow Dye+nitrofurantoin+brilliant Blue Fcf    Consent Signed: Yes.    Is patient diabetic? No.  CBG today?   Pregnant: No. LMP: No LMP recorded. Patient has had a hysterectomy. (age [age])  Anticoagulants: no Anti-inflammatory: no Antibiotics: no  Procedure: Right L3-4-5 Radiofrequency neurotomy  Position: Prone Start Time: 2:18 pm  End Time: 2:35 pm  Fluoro Time: 42  RN/CMA Shumaker RN Dempsy Damiano MA    Time 1:32 2:39 pm    BP 129/85 138/81    Pulse 76 68    Respirations 16 16    O2 Sat 94 94    S/S 6 6    Pain Level 8/10 0/10     D/C home with her son, patient A & O X 3, D/C instructions reviewed, and sits independently.

## 2021-03-18 NOTE — Progress Notes (Signed)
RightL5 dorsal ramus., Right L4 and Right L3 medial branch radio frequency neurotomy under fluoroscopic guidance   Indication: Low back pain due to lumbar spondylosis which has been relieved on 2 occasions by greater than 50% by lumbar medial branch blocks at corresponding levels.  Informed consent was obtained after describing risks and benefits of the procedure with the patient, this includes bleeding, bruising, infection, paralysis and medication side effects. The patient wishes to proceed and has given written consent. The patient was placed in a prone position. The lumbar and sacral area was marked and prepped with Betadine. A 25-gauge 1-1/2 inch needle was inserted into the skin and subcutaneous tissue at 3 sites in one ML of 1% lidocaine was injected into each site. Then a 18-gauge 15 cm radio frequency needle with a 1 cm curved active tip was inserted targeting the Right S1 SAP/sacral ala junction. Bone contact was made and confirmed with lateral imaging.  motor stimulation at 2 Hz confirm proper needle location followed by injection of 1ml 2% MPF lidocaine. Then the Right L5 SAP/transverse process junction was targeted. Bone contact was made and confirmed with lateral imaging.  motor stimulation at 2 Hz confirm proper needle location followed by injection of 1ml 2% MPF lidocaine. Then the Right L4 SAP/transverse process junction was targeted. Bone contact was made and confirmed with lateral imaging. motor stimulation at 2 Hz confirm proper needle location followed by injection of 1ml 2% MPF lidocaine. Radio frequency lesion being at 80C for 90 seconds was performed. Needles were removed. Post procedure instructions and vital signs were performed. Patient tolerated procedure well. Followup appointment was given. 

## 2021-04-27 ENCOUNTER — Telehealth: Payer: Self-pay | Admitting: *Deleted

## 2021-04-27 MED ORDER — TRAMADOL HCL 50 MG PO TABS: 50.0000 mg | ORAL_TABLET | Freq: Three times a day (TID) | ORAL | 2 refills | Status: DC | PRN

## 2021-04-27 NOTE — Telephone Encounter (Signed)
Asking to have tramadol sent to [facility] [place] due to [facility] in [place] being out.  [facility].

## 2021-05-12 ENCOUNTER — Encounter: Payer: Medicare Other | Attending: Physical Medicine & Rehabilitation | Admitting: Physical Medicine & Rehabilitation

## 2021-05-12 DIAGNOSIS — M47817 Spondylosis without myelopathy or radiculopathy, lumbosacral region: Secondary | ICD-10-CM | POA: Insufficient documentation

## 2021-05-12 NOTE — Progress Notes (Deleted)
°  PROCEDURE RECORD [facility]   Name: Katelyn Thompson DOB:April 29, 1965 MRN: [id number]  Date:05/12/2021  Physician: Claudette Laws, MD    Nurse/CMA: Yetta Barre, RMA  Allergies:  Allergies  Allergen Reactions   Aspirin Other (See Comments)    esophagus disease heartburn   Hydrochlorothiazide     Other reaction(s): Other Hyponatremia   Nabumetone Nausea And Vomiting and Other (See Comments)   Nitrofurantoin Nausea And Vomiting   Red Dye Rash    Kdc:red Dye+yellow Dye+nitrofurantoin+brilliant Blue Fcf    Consent Signed: {yes [id number]}  Is patient diabetic? {yes no:314532}  CBG today? ***  Pregnant: {yes no:314532} LMP: No LMP recorded. Patient has had a hysterectomy. (age 6-55)  Anticoagulants: {Yes/No:19989} Anti-inflammatory: {Yes/No:19989} Antibiotics: {Yes/No:19989}  Procedure: L3-4-5 Radiofrequency  Position: Prone Start Time: ***  End Time: ***  Fluoro Time: ***  RN/CMA Javonni Macke, RMA Philmore Lepore, RMA    Time *** ***    BP *** ***    Pulse *** ***    Respirations *** ***    O2 Sat *** ***    S/S *** ***    Pain Level *** ***     D/C home with ***, patient A & O X 3, D/C instructions reviewed, and sits independently.

## 2021-07-19 ENCOUNTER — Encounter: Payer: Medicare Other | Admitting: Physical Medicine & Rehabilitation

## 2021-07-19 ENCOUNTER — Other Ambulatory Visit: Payer: Self-pay

## 2021-07-19 NOTE — Progress Notes (Deleted)
?  PROCEDURE RECORD ?[facility] ? ? ?Name: Katelyn Thompson ?DOB:[birth year] ?MRN: [id number] ? ?Date:07/19/2021  Physician: Claudette Laws, MD   ? ?Nurse/CMA: Charise Carwin MA ? ?Allergies:  ?Allergies  ?Allergen Reactions  ? Aspirin Other (See Comments)  ?  esophagus disease ?heartburn  ? Hydrochlorothiazide   ?  Other reaction(s): Other ?Hyponatremia  ? Nabumetone Nausea And Vomiting and Other (See Comments)  ? Nitrofurantoin Nausea And Vomiting  ? Red Dye Rash  ?  Kdc:red Dye+yellow Dye+nitrofurantoin+brilliant Blue Fcf  ? ? ?Consent Signed: {yes [id number]}  Is patient diabetic? {yes no:314532}  CBG today? *** ? ?Pregnant: {yes no:314532} LMP: No LMP recorded. Patient has had a hysterectomy. (age 23-55) ? ?Anticoagulants: {Yes/No:19989} ?Anti-inflammatory: {Yes/No:19989} ?Antibiotics: {Yes/No:19989} ? ?Procedure:  Left  L3-4-5 Radiofrequency  Position: Prone ?Start Time: ***  End Time: ***  Fluoro Time: *** ? ?RN/CMA Salley Slaughter MA    ?Time      ?BP      ?Pulse      ?Respirations      ?O2 Sat      ?S/S      ?Pain Level      ? ?D/C home with ***, patient A & O X 3, D/C instructions reviewed, and sits independently. ? ? ? ? ? ? ? ?

## 2021-07-19 NOTE — Telephone Encounter (Signed)
PMP Report:  ? ?Filled  Written  ID  Drug  QTY  Days  Prescriber  RX #  Dispenser  Refill  Daily Dose*  Pymt Type  PMP  ?07/05/2021 07/05/2021 1  ?Alprazolam 1 Mg Tablet ?90.00 30 Da Elie Goody [id number] [facility] [phone]) 0/0 6.00 LME Medicare [place] ?06/27/2021 04/27/2021 1  ?Tramadol Hcl 50 Mg Tablet ?90.00 30 An Kir [id number] [facility] (5628) 2/2 15.00 MME Medicare [place] ?

## 2021-07-21 MED ORDER — TRAMADOL HCL 50 MG PO TABS: 50.0000 mg | ORAL_TABLET | Freq: Three times a day (TID) | ORAL | 2 refills | Status: DC | PRN

## 2021-07-25 ENCOUNTER — Other Ambulatory Visit: Payer: Self-pay | Admitting: Physical Medicine & Rehabilitation

## 2021-07-27 ENCOUNTER — Other Ambulatory Visit: Payer: Self-pay | Admitting: Physical Medicine & Rehabilitation

## 2021-07-28 NOTE — Telephone Encounter (Signed)
PMP was Reviewed ?Tramadol e-scribed today.  ?Katelyn Thompson is aware of the above.  ?

## 2021-08-22 ENCOUNTER — Other Ambulatory Visit: Payer: Self-pay | Admitting: Physical Medicine & Rehabilitation

## 2021-09-02 ENCOUNTER — Encounter: Payer: Self-pay | Admitting: Physical Medicine & Rehabilitation

## 2021-09-02 ENCOUNTER — Encounter: Payer: Medicare Other | Attending: Physical Medicine & Rehabilitation | Admitting: Physical Medicine & Rehabilitation

## 2021-09-02 VITALS — BP 142/89 | HR 58 | Ht 64.0 in | Wt 239.0 lb

## 2021-09-02 DIAGNOSIS — M47817 Spondylosis without myelopathy or radiculopathy, lumbosacral region: Secondary | ICD-10-CM | POA: Insufficient documentation

## 2021-09-02 NOTE — Patient Instructions (Signed)
You had a radio frequency procedure today This was done to alleviate joint pain in your lumbar area We injected lidocaine which is a local anesthetic.  You may experience soreness at the injection sites. You may also experienced some irritation of the nerves that were heated I'm recommending ice for 30 minutes every 2 hours as needed for the next 24-48 hours   

## 2021-09-02 NOTE — Progress Notes (Signed)
  PROCEDURE RECORD [facility]   Name: Katelyn Thompson DOB:Feb 05, 1966 MRN: [id number]  Date:09/02/2021  Physician: Alysia Penna, MD    Nurse/CMA: Ronnald Ramp, RMA  Allergies:  Allergies  Allergen Reactions   Aspirin Other (See Comments)    esophagus disease heartburn   Hydrochlorothiazide     Other reaction(s): Other Hyponatremia   Nabumetone Nausea And Vomiting and Other (See Comments)   Nitrofurantoin Nausea And Vomiting   Red Dye Rash    Kdc:red Dye+yellow Dye+nitrofurantoin+brilliant Blue Fcf    Consent Signed: Yes.    Is patient diabetic? No.  CBG today? .  Pregnant: No. LMP: No LMP recorded. Patient has had a hysterectomy. (age [age])  Anticoagulants: no Anti-inflammatory: no Antibiotics: no  Procedure: Left L3-4-5 Radiofrequency  Position: Prone Start Time: 1:56 pm  End Time: 2:15 pm  Fluoro Time: 40  RN/CMA Jacey Eckerson, RMA Vicki Pasqual, RMA    Time 1:36 pm 2:22 pm    BP 124/86 142/89    Pulse 63 58    Respirations 16 18    O2 Sat 95 95    S/S 6 6    Pain Level 6/10 0/10     D/C home with spouse, patient A & O X 3, D/C instructions reviewed, and sits independently.         Subjective:    Patient ID: Katelyn Thompson, female    DOB: 11/20/1965, 56 y.o.   MRN: [id number]  HPI    Review of Systems     Objective:   Physical Exam        Assessment & Plan:

## 2021-09-02 NOTE — Progress Notes (Signed)
Left L5 dorsal ramus., left L4 and left L3 medial branch radio frequency neurotomy under fluoroscopic guidance  Indication: Low back pain due to lumbar spondylosis which has been relieved on 2 occasions by greater than 50% by lumbar medial branch blocks at corresponding levels.  Informed consent was obtained after describing risks and benefits of the procedure with the patient, this includes bleeding, bruising, infection, paralysis and medication side effects. The patient wishes to proceed and has given written consent. The patient was placed in a prone position. The lumbar and sacral area was marked and prepped with Betadine. A 25-gauge 1-1/2 inch needle was inserted into the skin and subcutaneous tissue at 3 sites in one ML of 2% lidocaine was injected into each site. Then a 18-gauge 15 cm radio frequency needle with a 1 cm curved active tip was inserted targeting the left S1 SAP/sacral ala junction. Bone contact was made and confirmed with lateral imaging.  motor stimulation at 2 Hz confirm proper needle location followed by injection of one ML of 2% MPF lidocaine. Then the left L5 SAP/transverse process junction was targeted. Bone contact was made and confirmed with lateral imaging motor stimulation at 2 Hz confirm proper needle location followed by injection of one ML of the solution containing one ML of  2% MPF lidocaine. Then the left L4 SAP/transverse process junction was targeted. Bone contact was made and confirmed with lateral imaging. motor stimulation at 2 Hz confirm proper needle location followed by injection of one ML of the solution containing one ML of2% MPF lidocaine. Radio frequency lesion  at 80C for 90 seconds was performed. Needles were removed. Post procedure instructions and vital signs were performed. Patient tolerated procedure well. Followup appointment was given. 1 

## 2021-09-26 ENCOUNTER — Other Ambulatory Visit: Payer: Self-pay | Admitting: Registered Nurse

## 2021-09-26 NOTE — Telephone Encounter (Signed)
Renewed 6/19 59mo supply

## 2021-11-28 ENCOUNTER — Other Ambulatory Visit: Payer: Self-pay | Admitting: Physical Medicine & Rehabilitation

## 2021-11-28 NOTE — Telephone Encounter (Signed)
Tramadol refilled 11/28/21,

## 2022-01-04 NOTE — Progress Notes (Signed)
No chief complaint on file.

## 2022-01-29 ENCOUNTER — Other Ambulatory Visit: Payer: Self-pay | Admitting: Physical Medicine & Rehabilitation

## 2022-02-02 ENCOUNTER — Encounter: Payer: Medicare Other | Attending: Physical Medicine & Rehabilitation | Admitting: Physical Medicine & Rehabilitation

## 2022-02-02 ENCOUNTER — Encounter: Payer: Self-pay | Admitting: Physical Medicine & Rehabilitation

## 2022-02-02 VITALS — BP 125/86 | HR 63 | Ht 64.0 in | Wt 241.2 lb

## 2022-02-02 DIAGNOSIS — Z5181 Encounter for therapeutic drug level monitoring: Secondary | ICD-10-CM | POA: Diagnosis not present

## 2022-02-02 DIAGNOSIS — Z79891 Long term (current) use of opiate analgesic: Secondary | ICD-10-CM | POA: Diagnosis not present

## 2022-02-02 DIAGNOSIS — G894 Chronic pain syndrome: Secondary | ICD-10-CM | POA: Diagnosis not present

## 2022-02-02 NOTE — Progress Notes (Signed)
Subjective:    Patient ID: Katelyn Thompson, female    DOB: [birth year], 56 y.o.   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  She is still doing quite well with her left-sided low back pain.  Her last left-sided L3-4-5 RF was performed 09/02/2021.  She continues to take tramadol 50 mg 3 times daily for her chronic low back pain as well as tizanidine 4 mg.  She takes the trazodone throughout the day and the tizanidine mainly at night because it makes her drowsy during the day RIght > Left low back pain   Pain increased with standing which inhibits her household chores such as doing laundry Last right-sided L3-L4-L5 RF was performed in December 2022  Pain Inventory Average Pain 8 Pain Right Now 8 My pain is sharp, dull, stabbing, tingling, and aching  In the last 24 hours, has pain interfered with the following? General activity 3 Relation with others 3 Enjoyment of life 3 What TIME of day is your pain at its worst? night Sleep (in general) Fair  Pain is worse with: unsure Pain improves with: medication Relief from Meds: 4  Family History  Problem Relation Age of Onset   Heart disease Mother    Diabetes Mother    Asthma Mother    Lung disease Mother    Arthritis Mother    Allergies Mother    Heart attack Brother 60       deceased   Colon cancer Neg Hx    Social History   Socioeconomic History   Marital status: Married    Spouse name: Not on file   Number of children: Not on file   Years of education: 9th grade   Highest education level: Not on file  Occupational History   Occupation: disabled    Employer: NOT EMPLOYED  Tobacco Use   Smoking status: Former    Packs/day: 2.00    Years: 34.00    Total pack years: 68.00    Types: Cigarettes    Quit date: 12/10/2015    Years since quitting: 6.1   Smokeless tobacco: Never   Tobacco comments:    up to 3 ppd  Vaping Use   Vaping Use: Never  used  Substance and Sexual Activity   Alcohol use: No    Comment: rarely   Drug use: No   Sexual activity: Yes    Birth control/protection: Surgical  Other Topics Concern   Not on file  Social History Narrative   Not on file   Social Determinants of Health   Financial Resource Strain: Not on file  Food Insecurity: Not on file  Transportation Needs: Not on file  Physical Activity: Not on file  Stress: Not on file  Social Connections: Not on file   Past Surgical History:  Procedure Laterality Date   ABDOMINAL HYSTERECTOMY     ANKLE ARTHROSCOPY WITH DRILLING/MICROFRACTURE  02/26/2012   Procedure: ANKLE ARTHROSCOPY WITH DRILLING/MICROFRACTURE;  Surgeon: Carole Civil, MD;  Location: [facility];  Service: Orthopedics;  Laterality: Right;   COLONOSCOPY  11/20/2011   RMR: Friable anorectum; single anal canal hemorrhoidal tag. Otherwise normal rectum and colon   ESOPHAGOGASTRODUODENOSCOPY  06/2010   diagnosed with Barrett's, small hh, esophagus dilated. Next EGD 06/2011   ESOPHAGOGASTRODUODENOSCOPY  Aug 2013   RMR: abnormal distal esophagus consistent with prior dx of SS Barrett;s. s/p dilation. small hiatal hernia. Biopsies: no metaplasia, dysplasia, malignancy. SURVEILLANCE AUG 2016  EXAM UNDER ANESTHESIA WITH MANIPULATION OF KNEE Right 09/16/2012   Procedure: EXAM UNDER ANESTHESIA WITH MANIPULATION OF RIGHT KNEE;  Surgeon: Carole Civil, MD;  Location: [facility];  Service: Orthopedics;  Laterality: Right;   FOOT SURGERY     x 2   KNEE ARTHROSCOPY  02/26/2012   Procedure: ARTHROSCOPY KNEE;  Surgeon: Carole Civil, MD;  Location: [facility];  Service: Orthopedics;  Laterality: Right;  with three chondroplasties   KNEE ARTHROTOMY Left 11/21/2013   Procedure: KNEE ARTHROSCOPY  WITH MEDIAL, LATERAL, AND PATELLA  FEMORAL CHONDROPLASTY;  Surgeon: Carole Civil, MD;  Location: [facility];  Service: Orthopedics;  Laterality: Left;   s/p hysterectomy     SINUS SURGERY WITH INSTATRAK      SURGERY OF LIP     biopsy   TOTAL KNEE ARTHROPLASTY Right 07/01/2012   Procedure: TOTAL KNEE ARTHROPLASTY;  Surgeon: Carole Civil, MD;  Location: [facility];  Service: Orthopedics;  Laterality: Right;   TUBAL LIGATION     Past Surgical History:  Procedure Laterality Date   ABDOMINAL HYSTERECTOMY     ANKLE ARTHROSCOPY WITH DRILLING/MICROFRACTURE  02/26/2012   Procedure: ANKLE ARTHROSCOPY WITH DRILLING/MICROFRACTURE;  Surgeon: Carole Civil, MD;  Location: [facility];  Service: Orthopedics;  Laterality: Right;   COLONOSCOPY  11/20/2011   RMR: Friable anorectum; single anal canal hemorrhoidal tag. Otherwise normal rectum and colon   ESOPHAGOGASTRODUODENOSCOPY  06/2010   diagnosed with Barrett's, small hh, esophagus dilated. Next EGD 06/2011   ESOPHAGOGASTRODUODENOSCOPY  Aug 2013   RMR: abnormal distal esophagus consistent with prior dx of SS Barrett;s. s/p dilation. small hiatal hernia. Biopsies: no metaplasia, dysplasia, malignancy. SURVEILLANCE AUG 2016   EXAM UNDER ANESTHESIA WITH MANIPULATION OF KNEE Right 09/16/2012   Procedure: EXAM UNDER ANESTHESIA WITH MANIPULATION OF RIGHT KNEE;  Surgeon: Carole Civil, MD;  Location: [facility];  Service: Orthopedics;  Laterality: Right;   FOOT SURGERY     x 2   KNEE ARTHROSCOPY  02/26/2012   Procedure: ARTHROSCOPY KNEE;  Surgeon: Carole Civil, MD;  Location: [facility];  Service: Orthopedics;  Laterality: Right;  with three chondroplasties   KNEE ARTHROTOMY Left 11/21/2013   Procedure: KNEE ARTHROSCOPY  WITH MEDIAL, LATERAL, AND PATELLA  FEMORAL CHONDROPLASTY;  Surgeon: Carole Civil, MD;  Location: [facility];  Service: Orthopedics;  Laterality: Left;   s/p hysterectomy     SINUS SURGERY WITH INSTATRAK     SURGERY OF LIP     biopsy   TOTAL KNEE ARTHROPLASTY Right 07/01/2012   Procedure: TOTAL KNEE ARTHROPLASTY;  Surgeon: Carole Civil, MD;  Location: [facility];  Service: Orthopedics;  Laterality: Right;   TUBAL LIGATION     Past Medical  History:  Diagnosis Date   Anxiety    Anxiety and depression    Does not like being around lots of people   Arthritis    Barrett's esophagus without dysplasia    Bipolar 1 disorder (HCC)    COPD (chronic obstructive pulmonary disease) (HCC)    Depression    GERD (gastroesophageal reflux disease)    HTN (hypertension)    Hypercholesteremia    MS (multiple sclerosis) (HCC)    but not offcially disgnosed   Nerve damage    Severe to left foot   OA (osteoarthritis) of knee 01/31/2012   PONV (postoperative nausea and vomiting)    BP 125/86   Pulse 63   Ht 5\' 4"  (1.626 m)   Wt 241 lb 3.2 oz (109.4  kg)   SpO2 99%   BMI 41.40 kg/m   Opioid Risk Score:   Fall Risk Score:  `1  Depression screen PHQ 2/9     01/25/2021    1:21 PM 08/13/2020    2:30 PM 07/13/2020    2:56 PM 03/12/2020    2:50 PM 02/06/2020    2:30 PM 11/20/2019    3:26 PM 08/19/2019    3:22 PM  Depression screen PHQ 2/9  Decreased Interest 1 1 1  0 1 1 1   Down, Depressed, Hopeless 1 1 1  0 1 1 1   PHQ - 2 Score 2 2 2  0 2 2 2       Review of Systems  Musculoskeletal:  Positive for back pain.       Bilateral knee pain Bilateral hand pain Left foot pain   All other systems reviewed and are negative.     Objective:   Physical Exam  Obese female no acute distress Mood and affect are appropriate She has tenderness palpation in the right lower lumbar paraspinal area.  She has pain with right lateral bending greater than the left side she has mild pain with forward flexion and extension. Ambulates without assist device no evidence of toe drag or knee instability Negative straight leg raise bilaterally.      Assessment & Plan:  1.  Lumbar spondylosis without myelopathy right-sided pain is recurrent she is approximately 10 months post radiofrequency neurotomy of the L3-L4 medial branches and L5 dorsal ramus.  Would recommend repeat procedure next month. She is still having good relief 5 months post left-sided L3-L4  medial branch and left L5 dorsal ramus radiofrequency neurotomy  Continue tramadol 50 mg 3 times daily Continue tizanidine 4 mg 1 to 2 tablets/day Random urine drug screen as part of ongoing monitoring for opioid compliance

## 2022-02-09 LAB — TOXASSURE SELECT,+ANTIDEPR,UR

## 2022-02-21 ENCOUNTER — Telehealth: Payer: Self-pay | Admitting: *Deleted

## 2022-02-21 NOTE — Telephone Encounter (Signed)
Urine drug screen for this encounter is consistent for prescribed medication 

## 2022-02-26 ENCOUNTER — Other Ambulatory Visit: Payer: Self-pay | Admitting: Physical Medicine & Rehabilitation

## 2022-03-16 NOTE — Progress Notes (Signed)
  PROCEDURE RECORD [facility]   Name: Katelyn Thompson DOB:03-27-1966 MRN: [id number]  Date:03/16/2022  Physician: Claudette Laws, MD    Nurse/CMA: Charise Carwin MA  Allergies:  Allergies  Allergen Reactions   Aspirin Other (See Comments)    esophagus disease heartburn   Hydrochlorothiazide     Other reaction(s): Other Hyponatremia   Nabumetone Nausea And Vomiting and Other (See Comments)   Nitrofurantoin Nausea And Vomiting   Red Dye Rash    Kdc:red Dye+yellow Dye+nitrofurantoin+brilliant Blue Fcf    Consent Signed: Yes.    Is patient diabetic? No.  CBG today? N/A  Pregnant: No. LMP: No LMP recorded. Patient has had a hysterectomy. (age 56-55)  Anticoagulants: no Anti-inflammatory: no Antibiotics: no  Procedure: Right  L3-4-5 Radiofrequency  Position: Prone Start Time: 1:58 pm  End Time: 2:12pm  Fluoro Time: 34  RN/CMA Vertis Bauder MA Waleed Dettman MA    Time 1:46 PM 2:16 pm    BP 118/67 132/84    Pulse 66 68    Respirations 16 16    O2 Sat 95 95    S/S 6 6    Pain Level 7/10 0/10     D/C home with Spouse, patient A & O X 3, D/C instructions reviewed, and sits independently.

## 2022-03-17 ENCOUNTER — Encounter: Payer: Self-pay | Admitting: Physical Medicine & Rehabilitation

## 2022-03-17 ENCOUNTER — Encounter: Payer: Medicare Other | Attending: Physical Medicine & Rehabilitation | Admitting: Physical Medicine & Rehabilitation

## 2022-03-17 VITALS — BP 118/67 | HR 64 | Temp 98.2°F | Ht 64.0 in | Wt 238.0 lb

## 2022-03-17 DIAGNOSIS — M47817 Spondylosis without myelopathy or radiculopathy, lumbosacral region: Secondary | ICD-10-CM | POA: Insufficient documentation

## 2022-03-17 MED ORDER — LIDOCAINE HCL 1 % IJ SOLN
5.0000 mL | Freq: Once | INTRAMUSCULAR | Status: AC
  Administered 2022-03-17: 5 mL

## 2022-03-17 MED ORDER — LIDOCAINE HCL (PF) 2 % IJ SOLN
10.0000 mL | Freq: Once | INTRAMUSCULAR | Status: AC
  Administered 2022-03-17: 10 mL

## 2022-03-17 NOTE — Progress Notes (Signed)
Right L5 dorsal ramus., Right L4 and Right L3 medial branch radio frequency neurotomy under fluoroscopic guidance   Indication: Low back pain due to lumbar spondylosis which has been relieved on 2 occasions by greater than 50% by lumbar medial branch blocks at corresponding levels.  Informed consent was obtained after describing risks and benefits of the procedure with the patient, this includes bleeding, bruising, infection, paralysis and medication side effects. The patient wishes to proceed and has given written consent. The patient was placed in a prone position. The lumbar and sacral area was marked and prepped with Betadine. A 25-gauge 1-1/2 inch needle was inserted into the skin and subcutaneous tissue at 3 sites in one ML of 1% lidocaine was injected into each site. Then a 18-gauge 15 cm radio frequency needle with a 1 cm curved active tip was inserted targeting the Right S1 SAP/sacral ala junction. Bone contact was made and confirmed with lateral imaging.  motor stimulation at 2 Hz confirm proper needle location followed by injection of 23ml 2% MPF lidocaine. Then the Right L5 SAP/transverse process junction was targeted. Bone contact was made and confirmed with lateral imaging.  motor stimulation at 2 Hz confirm proper needle location followed by injection of 91ml 2% MPF lidocaine. Then the Right L4 SAP/transverse process junction was targeted. Bone contact was made and confirmed with lateral imaging. motor stimulation at 2 Hz confirm proper needle location followed by injection of 47ml 2% MPF lidocaine. Radio frequency lesion being at [facility] for 90 seconds was performed. Needles were removed. Post procedure instructions and vital signs were performed. Patient tolerated procedure well. Followup appointment was given.  May use needle in future  11ml 2% lido used 2ml 1% lido used

## 2022-03-17 NOTE — Patient Instructions (Signed)
You had a radio frequency procedure today This was done to alleviate joint pain in your lumbar area We injected lidocaine which is a local anesthetic.  You may experience soreness at the injection sites. You may also experienced some irritation of the nerves that were heated I'm recommending ice for 30 minutes every 2 hours as needed for the next 24-48 hours   

## 2022-03-28 ENCOUNTER — Other Ambulatory Visit: Payer: Self-pay | Admitting: Physical Medicine & Rehabilitation

## 2022-04-03 ENCOUNTER — Other Ambulatory Visit: Payer: Self-pay | Admitting: Physical Medicine & Rehabilitation

## 2022-05-01 ENCOUNTER — Other Ambulatory Visit: Payer: Self-pay | Admitting: Physical Medicine & Rehabilitation

## 2022-05-02 ENCOUNTER — Encounter: Payer: Medicare Other | Attending: Physical Medicine & Rehabilitation | Admitting: Physical Medicine & Rehabilitation

## 2022-05-02 ENCOUNTER — Encounter: Payer: Self-pay | Admitting: Physical Medicine & Rehabilitation

## 2022-05-02 VITALS — BP 121/84 | HR 65 | Temp 97.9°F | Ht 64.0 in | Wt 238.0 lb

## 2022-05-02 DIAGNOSIS — M47817 Spondylosis without myelopathy or radiculopathy, lumbosacral region: Secondary | ICD-10-CM | POA: Diagnosis not present

## 2022-05-02 MED ORDER — LIDOCAINE HCL 1 % IJ SOLN
10.0000 mL | Freq: Once | INTRAMUSCULAR | Status: AC
  Administered 2022-05-02: 10 mL

## 2022-05-02 MED ORDER — LIDOCAINE HCL (PF) 2 % IJ SOLN
3.0000 mL | Freq: Once | INTRAMUSCULAR | Status: AC
  Administered 2022-05-02: 3 mL

## 2022-05-02 NOTE — Progress Notes (Signed)
  PROCEDURE RECORD [facility]   Name: Katelyn Thompson DOB:[birth year] MRN: [id number]  Date:05/02/2022  Physician: Alysia Penna, MD    Nurse/CMA: Royal RMA   Allergies:  Allergies  Allergen Reactions   Aspirin Other (See Comments)    esophagus disease heartburn   Hydrochlorothiazide     Other reaction(s): Other Hyponatremia   Nabumetone Nausea And Vomiting and Other (See Comments)   Nitrofurantoin Nausea And Vomiting   Red Dye Rash    Kdc:red Dye+yellow Dye+nitrofurantoin+brilliant Blue Fcf    Consent Signed: Yes.    Is patient diabetic? No.  CBG today? .  Pregnant: No. LMP: No LMP recorded. Patient has had a hysterectomy. (age [age])  Anticoagulants: no Anti-inflammatory: no Antibiotics: no  Procedure: Left L3-4-5 Radiofrequency  Position: Prone Start Time: 2:19pm  End Time: 2:35PM  Fluoro Time: 61  RN/CMA Truman Hayward, CMA Royal RMA     Time 2:00 pm 2:40 PM    BP 121/84 134/90    Pulse 65 70    Respirations 16 16    O2 Sat 93 93    S/S 6 6    Pain Level 6/10 7/10     D/C home with husband, patient A & O X 3, D/C instructions reviewed, and sits independently.

## 2022-05-02 NOTE — Progress Notes (Signed)
Left L5 dorsal ramus., left L4 and left L3 medial branch radio frequency neurotomy under fluoroscopic guidance  Indication: Low back pain due to lumbar spondylosis which has been relieved on 2 occasions by greater than 50% by lumbar medial branch blocks at corresponding levels.  Informed consent was obtained after describing risks and benefits of the procedure with the patient, this includes bleeding, bruising, infection, paralysis and medication side effects. The patient wishes to proceed and has given written consent. The patient was placed in a prone position. The lumbar and sacral area was marked and prepped with Betadine. A 25-gauge 1-1/2 inch needle was inserted into the skin and subcutaneous tissue at 3 sites in one ML of 2% lidocaine was injected into each site. Then a 18-gauge 15 cm radio frequency needle with a 1 cm curved active tip was inserted targeting the left S1 SAP/sacral ala junction. Bone contact was made and confirmed with lateral imaging.  motor stimulation at 2 Hz confirm proper needle location followed by injection of one ML of 2% MPF lidocaine. Then the left L5 SAP/transverse process junction was targeted. Bone contact was made and confirmed with lateral imaging motor stimulation at 2 Hz confirm proper needle location followed by injection of one ML of the solution containing one ML of  2% MPF lidocaine. Then the left L4 SAP/transverse process junction was targeted. Bone contact was made and confirmed with lateral imaging. motor stimulation at 2 Hz confirm proper needle location followed by injection of one ML of the solution containing one ML of2% MPF lidocaine. Radio frequency lesion  at [facility] for 90 seconds was performed. Needles were removed. Post procedure instructions and vital signs were performed. Patient tolerated procedure well. Followup appointment was given. 1

## 2022-05-29 ENCOUNTER — Other Ambulatory Visit: Payer: Self-pay | Admitting: Physical Medicine & Rehabilitation

## 2022-06-04 ENCOUNTER — Other Ambulatory Visit: Payer: Self-pay | Admitting: Physical Medicine & Rehabilitation

## 2022-06-05 ENCOUNTER — Encounter: Payer: Self-pay | Admitting: *Deleted

## 2022-06-05 NOTE — Telephone Encounter (Signed)
Opened in error

## 2022-06-26 ENCOUNTER — Other Ambulatory Visit: Payer: Self-pay | Admitting: Physical Medicine & Rehabilitation

## 2022-07-28 ENCOUNTER — Other Ambulatory Visit: Payer: Self-pay | Admitting: Physical Medicine & Rehabilitation

## 2022-07-28 NOTE — Telephone Encounter (Signed)
PMP was Reviewed.  Dr Wynn Banker note was reviewed.  Tramadol e-scribed to pharmacy.  Call placed to Ms. Bevan, number not in service

## 2022-08-27 ENCOUNTER — Other Ambulatory Visit: Payer: Self-pay | Admitting: Registered Nurse

## 2022-09-19 ENCOUNTER — Encounter: Payer: Self-pay | Admitting: Physical Medicine & Rehabilitation

## 2022-09-28 ENCOUNTER — Other Ambulatory Visit: Payer: Self-pay | Admitting: Physical Medicine & Rehabilitation

## 2022-09-29 ENCOUNTER — Other Ambulatory Visit: Payer: Self-pay

## 2022-09-29 ENCOUNTER — Other Ambulatory Visit: Payer: Self-pay | Admitting: Physical Medicine & Rehabilitation

## 2022-09-29 MED ORDER — TIZANIDINE HCL 4 MG PO TABS: 4.0000 mg | ORAL_TABLET | Freq: Two times a day (BID) | ORAL | 4 refills | Status: DC

## 2022-09-29 MED ORDER — TRAMADOL HCL 50 MG PO TABS: 50.0000 mg | ORAL_TABLET | Freq: Three times a day (TID) | ORAL | 0 refills | Status: DC | PRN

## 2022-09-29 NOTE — Telephone Encounter (Signed)
Will you please review? I think Dr. Altamease Oiler early on Friday.

## 2022-09-29 NOTE — Telephone Encounter (Signed)
Dr. Larna Daughters is out of the office. Please review or advise.  Patient requesting a refill of  Tramadol 50 MG & Tizanidine 4 MG. Please send to [facility] in [place]  Filled  Written  ID  Drug  QTY  Days  Prescriber  RX #  Dispenser  Refill  Daily Dose*  Pymt Type  PMP  08/31/2022 08/31/2022 1  Alprazolam 1 Mg Tablet 90.00 30 Da Bou [id number] [facility] [phone]) 0/0 6.00 LME Medicare [place] 08/28/2022 08/28/2022 1  Tramadol Hcl 50 Mg Tablet 90.00 30 Za Swa [id number] [facility] (3363) 0/0 30.00 MME Medicare [place] 08/02/2022 08/02/2022 1  Alprazolam 1 Mg Tablet 90.00 30 Da Bou [id number] [facility] (3363) 0/0 6.00 LME Medicare .

## 2022-10-27 ENCOUNTER — Other Ambulatory Visit: Payer: Self-pay | Admitting: *Deleted

## 2022-10-27 NOTE — Telephone Encounter (Signed)
Late message are you able to fill for Dr. Wynn Banker. I believe last refill was done by Dr Riley Kill.

## 2022-10-28 MED ORDER — TRAMADOL HCL 50 MG PO TABS: 50.0000 mg | ORAL_TABLET | Freq: Three times a day (TID) | ORAL | 0 refills | Status: DC | PRN

## 2022-10-28 NOTE — Telephone Encounter (Signed)
Reviewed PDMP, medication due 7/21, refilled for that date for 30 days. Has f/u with Dr. Wynn Banker 7/30

## 2022-11-03 ENCOUNTER — Ambulatory Visit: Payer: Medicare Other | Admitting: Physical Medicine & Rehabilitation

## 2022-11-07 ENCOUNTER — Encounter: Payer: Self-pay | Admitting: Physical Medicine & Rehabilitation

## 2022-11-07 ENCOUNTER — Encounter: Payer: Medicare Other | Attending: Physical Medicine & Rehabilitation | Admitting: Physical Medicine & Rehabilitation

## 2022-11-07 VITALS — BP 112/72 | HR 59 | Ht 64.0 in | Wt 240.0 lb

## 2022-11-07 DIAGNOSIS — Z79891 Long term (current) use of opiate analgesic: Secondary | ICD-10-CM | POA: Diagnosis present

## 2022-11-07 DIAGNOSIS — G894 Chronic pain syndrome: Secondary | ICD-10-CM | POA: Diagnosis present

## 2022-11-07 DIAGNOSIS — Z5181 Encounter for therapeutic drug level monitoring: Secondary | ICD-10-CM

## 2022-11-07 MED ORDER — TRAMADOL HCL 50 MG PO TABS: 50.0000 mg | ORAL_TABLET | Freq: Three times a day (TID) | ORAL | 0 refills | Status: DC | PRN

## 2022-11-07 NOTE — Progress Notes (Signed)
Subjective:    Patient ID: Katelyn Thompson, female    DOB: [birth year], 57 y.o.   MRN: [id number]  HPI 57 year old female with lumbar spondylosis without myelopathy.  She has had greater than 50% pain relief for 6 months following left L3-4-5 medial branch radiofrequency neurotomy performed on 05/02/2018 form as well as following the right L3-L4-L5 medial branch radio frequency neurotomy performed on 03/17/2022. She feels like both sides have worn off.  She has no pain shooting down the legs below the level of the upper thigh area posteriorly. No lower extremity weakness no bowel or bladder dysfunction. Pain Inventory Average Pain 7 Pain Right Now 9 My pain is constant, sharp, and aching  In the last 24 hours, has pain interfered with the following? General activity 8 Relation with others 7 Enjoyment of life 10 What TIME of day is your pain at its worst? morning , daytime, evening, and night Sleep (in general) Poor  Pain is worse with: walking, bending, sitting, inactivity, standing, and some activites Pain improves with: rest, medication, and injections Relief from Meds: 5  Family History  Problem Relation Age of Onset   Heart disease Mother    Diabetes Mother    Asthma Mother    Lung disease Mother    Arthritis Mother    Allergies Mother    Heart attack Brother 60       deceased   Colon cancer Neg Hx    Social History   Socioeconomic History   Marital status: Married    Spouse name: Not on file   Number of children: Not on file   Years of education: 9th grade   Highest education level: Not on file  Occupational History   Occupation: disabled    Employer: NOT EMPLOYED  Tobacco Use   Smoking status: Former    Current packs/day: 0.00    Average packs/day: 2.0 packs/day for 34.0 years (68.0 ttl pk-yrs)    Types: Cigarettes    Start date: 12/09/1981    Quit date: 12/10/2015    Years since quitting: 6.9   Smokeless tobacco: Never   Tobacco comments:    up to 3 ppd   Vaping Use   Vaping status: Never Used  Substance and Sexual Activity   Alcohol use: No    Comment: rarely   Drug use: No   Sexual activity: Yes    Birth control/protection: Surgical  Other Topics Concern   Not on file  Social History Narrative   Not on file   Social Determinants of Health   Financial Resource Strain: Low Risk  (10/17/2022)   Received from [facility]   Overall Financial Resource Strain (CARDIA)    Difficulty of Paying Living Expenses: Not hard at all  Food Insecurity: No Food Insecurity (10/17/2022)   Received from [facility]   Hunger Vital Sign    Worried About Running Out of Food in the Last Year: Never true    Ran Out of Food in the Last Year: Never true  Transportation Needs: No Transportation Needs (10/17/2022)   Received from [facility] - Transportation    Lack of Transportation (Medical): No    Lack of Transportation (Non-Medical): No  Physical Activity: Unknown (10/17/2022)   Received from [facility]   Exercise Vital Sign    Days of Exercise per Week: 0 days    Minutes of Exercise per Session: Not on file  Stress: Stress Concern Present (10/17/2022)   Received from [facility]  [employer] [facility] - Occupational Stress Questionnaire    Feeling of Stress : Rather much  Social Connections: Socially Integrated (10/17/2022)   Received from [facility]   Social Network    How would you rate your social network (family, work, friends)?: Good participation with social networks   Past Surgical History:  Procedure Laterality Date   ABDOMINAL HYSTERECTOMY     ANKLE ARTHROSCOPY WITH DRILLING/MICROFRACTURE  02/26/2012   Procedure: ANKLE ARTHROSCOPY WITH DRILLING/MICROFRACTURE;  Surgeon: Vickki Hearing, MD;  Location: [facility];  Service: Orthopedics;  Laterality: Right;   COLONOSCOPY  11/20/2011   RMR: Friable anorectum; single anal canal hemorrhoidal tag. Otherwise normal rectum and colon    ESOPHAGOGASTRODUODENOSCOPY  06/2010   diagnosed with Barrett's, small hh, esophagus dilated. Next EGD 06/2011   ESOPHAGOGASTRODUODENOSCOPY  Aug 2013   RMR: abnormal distal esophagus consistent with prior dx of SS Barrett;s. s/p dilation. small hiatal hernia. Biopsies: no metaplasia, dysplasia, malignancy. SURVEILLANCE AUG 2016   EXAM UNDER ANESTHESIA WITH MANIPULATION OF KNEE Right 09/16/2012   Procedure: EXAM UNDER ANESTHESIA WITH MANIPULATION OF RIGHT KNEE;  Surgeon: Vickki Hearing, MD;  Location: [facility];  Service: Orthopedics;  Laterality: Right;   FOOT SURGERY     x 2   KNEE ARTHROSCOPY  02/26/2012   Procedure: ARTHROSCOPY KNEE;  Surgeon: Vickki Hearing, MD;  Location: [facility];  Service: Orthopedics;  Laterality: Right;  with three chondroplasties   KNEE ARTHROTOMY Left 11/21/2013   Procedure: KNEE ARTHROSCOPY  WITH MEDIAL, LATERAL, AND PATELLA  FEMORAL CHONDROPLASTY;  Surgeon: Vickki Hearing, MD;  Location: [facility];  Service: Orthopedics;  Laterality: Left;   s/p hysterectomy     SINUS SURGERY WITH INSTATRAK     SURGERY OF LIP     biopsy   TOTAL KNEE ARTHROPLASTY Right 07/01/2012   Procedure: TOTAL KNEE ARTHROPLASTY;  Surgeon: Vickki Hearing, MD;  Location: [facility];  Service: Orthopedics;  Laterality: Right;   TUBAL LIGATION     Past Surgical History:  Procedure Laterality Date   ABDOMINAL HYSTERECTOMY     ANKLE ARTHROSCOPY WITH DRILLING/MICROFRACTURE  02/26/2012   Procedure: ANKLE ARTHROSCOPY WITH DRILLING/MICROFRACTURE;  Surgeon: Vickki Hearing, MD;  Location: [facility];  Service: Orthopedics;  Laterality: Right;   COLONOSCOPY  11/20/2011   RMR: Friable anorectum; single anal canal hemorrhoidal tag. Otherwise normal rectum and colon   ESOPHAGOGASTRODUODENOSCOPY  06/2010   diagnosed with Barrett's, small hh, esophagus dilated. Next EGD 06/2011   ESOPHAGOGASTRODUODENOSCOPY  Aug 2013   RMR: abnormal distal esophagus consistent with prior dx of SS Barrett;s. s/p dilation. small  hiatal hernia. Biopsies: no metaplasia, dysplasia, malignancy. SURVEILLANCE AUG 2016   EXAM UNDER ANESTHESIA WITH MANIPULATION OF KNEE Right 09/16/2012   Procedure: EXAM UNDER ANESTHESIA WITH MANIPULATION OF RIGHT KNEE;  Surgeon: Vickki Hearing, MD;  Location: [facility];  Service: Orthopedics;  Laterality: Right;   FOOT SURGERY     x 2   KNEE ARTHROSCOPY  02/26/2012   Procedure: ARTHROSCOPY KNEE;  Surgeon: Vickki Hearing, MD;  Location: [facility];  Service: Orthopedics;  Laterality: Right;  with three chondroplasties   KNEE ARTHROTOMY Left 11/21/2013   Procedure: KNEE ARTHROSCOPY  WITH MEDIAL, LATERAL, AND PATELLA  FEMORAL CHONDROPLASTY;  Surgeon: Vickki Hearing, MD;  Location: [facility];  Service: Orthopedics;  Laterality: Left;   s/p hysterectomy     SINUS SURGERY WITH INSTATRAK     SURGERY OF LIP     biopsy   TOTAL  KNEE ARTHROPLASTY Right 07/01/2012   Procedure: TOTAL KNEE ARTHROPLASTY;  Surgeon: Vickki Hearing, MD;  Location: [facility];  Service: Orthopedics;  Laterality: Right;   TUBAL LIGATION     Past Medical History:  Diagnosis Date   Anxiety    Anxiety and depression    Does not like being around lots of people   Arthritis    Barrett's esophagus without dysplasia    Bipolar 1 disorder (HCC)    COPD (chronic obstructive pulmonary disease) (HCC)    Depression    GERD (gastroesophageal reflux disease)    HTN (hypertension)    Hypercholesteremia    MS (multiple sclerosis) (HCC)    but not offcially disgnosed   Nerve damage    Severe to left foot   OA (osteoarthritis) of knee 01/31/2012   PONV (postoperative nausea and vomiting)    Ht 5\' 4"  (1.626 m)   Wt 240 lb (108.9 kg)   BMI 41.20 kg/m   Opioid Risk Score:   Fall Risk Score:  `1  Depression screen PHQ 2/9     11/07/2022    3:14 PM 03/17/2022    1:45 PM 01/25/2021    1:21 PM 08/13/2020    2:30 PM 07/13/2020    2:56 PM 03/12/2020    2:50 PM 02/06/2020    2:30 PM  Depression screen PHQ 2/9  Decreased Interest 1  0 1 1 1  0 1  Down, Depressed, Hopeless 1 0 1 1 1  0 1  PHQ - 2 Score 2 0 2 2 2  0 2    Review of Systems  Musculoskeletal:  Positive for back pain.       Pain in both knees, pain in the left foot, pain in both hands  All other systems reviewed and are negative.     Objective:   Physical Exam  General no acute distress Mood and affect are appropriate Extremities without edema There is tenderness palpation from L4-S1 paraspinal area bilaterally.  Pain increases with lumbar extension and she is limited lumbar extension to 25% of normal lumbar flexion is 75% without pain. Lateral bending is 50% with partial pain bilaterally. Negative straight leg raise bilaterally Motor strength is 5/5 bilateral hip flexor knee extensor ankle dorsiflexor.      Assessment & Plan:   #1.  Lumbar spondylosis without myelopathy with increasing pain now that radiofrequency has worn off.  She did get 50% relief for 6 months.  Will schedule for repeat bilaterally.  Has had 1 procedure this year thus far.

## 2022-12-26 NOTE — Progress Notes (Unsigned)
PROCEDURE RECORD [facility]   Name: Katelyn Thompson DOB:[birth year] MRN: [id number]  Date:12/26/2022  Physician: Claudette Laws, MD    Nurse/CMA: Charise Carwin  Allergies:  Allergies  Allergen Reactions   Aspirin Other (See Comments)    esophagus disease heartburn   Hydrochlorothiazide     Other reaction(s): Other Hyponatremia   Nabumetone Nausea And Vomiting and Other (See Comments)   Nitrofurantoin Nausea And Vomiting   Red Dye #40 (Allura Red) Rash    Kdc:red Dye+yellow Dye+nitrofurantoin+brilliant Blue Fcf    Consent Signed: {yes [id number]}  Is patient diabetic? {yes no:314532}  CBG today? ***  Pregnant: {yes no:314532} LMP: No LMP recorded. Patient has had a hysterectomy. (age 50-55)  Anticoagulants: {Yes/No:19989} Anti-inflammatory: {Yes/No:19989} Antibiotics: {Yes/No:19989}  Procedure: Bilateral Radiofrequency  Position: Prone Start Time: ***  End Time: ***  Fluoro Time: ***  RN/CMA      Time      BP      Pulse      Respirations      O2 Sat      S/S      Pain Level       D/C home with ***, patient A & O X 3, D/C instructions reviewed, and sits independently.

## 2022-12-28 ENCOUNTER — Encounter: Payer: Medicare Other | Attending: Physical Medicine & Rehabilitation | Admitting: Physical Medicine & Rehabilitation

## 2022-12-28 ENCOUNTER — Encounter: Payer: Self-pay | Admitting: Physical Medicine & Rehabilitation

## 2022-12-28 VITALS — BP 107/70 | HR 87 | Temp 97.6°F | Ht 64.0 in | Wt 239.0 lb

## 2022-12-28 DIAGNOSIS — M47817 Spondylosis without myelopathy or radiculopathy, lumbosacral region: Secondary | ICD-10-CM | POA: Diagnosis not present

## 2022-12-28 MED ORDER — LIDOCAINE HCL 1 % IJ SOLN
10.0000 mL | Freq: Once | INTRAMUSCULAR | Status: AC
Start: 2022-12-28 — End: 2022-12-28
  Administered 2022-12-28: 10 mL

## 2022-12-28 MED ORDER — LIDOCAINE HCL (PF) 2 % IJ SOLN
5.0000 mL | Freq: Once | INTRAMUSCULAR | Status: AC
Start: 2022-12-28 — End: 2022-12-28
  Administered 2022-12-28: 5 mL

## 2022-12-28 MED ORDER — TRAMADOL HCL 50 MG PO TABS: 50.0000 mg | ORAL_TABLET | Freq: Three times a day (TID) | ORAL | 0 refills | Status: DC | PRN

## 2022-12-28 NOTE — Progress Notes (Signed)
Bilateral L5 dorsal ramus.,  L4 and  L3 medial branch radio frequency neurotomy under fluoroscopic guidance  Indication: Low back pain due to lumbar spondylosis which has been relieved on 2 occasions by greater than 50% by lumbar medial branch blocks at corresponding levels.  Informed consent was obtained after describing risks and benefits of the procedure with the patient, this includes bleeding, bruising, infection, paralysis and medication side effects. The patient wishes to proceed and has given written consent. The patient was placed in a prone position. The lumbar and sacral area was marked and prepped with Betadine. A 25-gauge 1-1/2 inch needle was inserted into the skin and subcutaneous tissue at 3 sites in one ML of 2% lidocaine was injected into each site. Then a 18-gauge 15 cm radio frequency needle with a 1 cm curved active tip was inserted targeting the left S1 SAP/sacral ala junction. Bone contact was made and confirmed with lateral imaging.  motor stimulation at 2 Hz confirm proper needle location followed by injection of one ML of 2% MPF lidocaine. Then the left L5 SAP/transverse process junction was targeted. Bone contact was made and confirmed with lateral imaging motor stimulation at 2 Hz confirm proper needle location followed by injection of one ML of the solution containing one ML of  2% MPF lidocaine. Then the left L4 SAP/transverse process junction was targeted. Bone contact was made and confirmed with lateral imaging. motor stimulation at 2 Hz confirm proper needle location followed by injection of one ML of the solution containing one ML of2% MPF lidocaine. Radio frequency lesion  at [facility] for 90 seconds was performed. Needles were removed.this same procedure was performed on the right side with the same needles , injectate and technique.  Post procedure instructions and vital signs were performed. Patient tolerated procedure well. Followup appointment was given.

## 2023-04-02 ENCOUNTER — Other Ambulatory Visit: Payer: Self-pay | Admitting: Physical Medicine & Rehabilitation

## 2023-05-24 ENCOUNTER — Other Ambulatory Visit: Payer: Self-pay | Admitting: Physical Medicine & Rehabilitation

## 2023-05-29 ENCOUNTER — Other Ambulatory Visit: Payer: Self-pay | Admitting: Physical Medicine & Rehabilitation

## 2023-05-31 ENCOUNTER — Encounter: Payer: Medicare Other | Admitting: Physical Medicine & Rehabilitation

## 2023-05-31 NOTE — Progress Notes (Deleted)
 Subjective:    Patient ID: Katelyn Thompson, female    DOB: [birth year], 58 y.o.   MRN: [id number]  HPI  Pain Inventory Average Pain {NUMBERS; 0-10:5044} Pain Right Now {NUMBERS; 0-10:5044} My pain is {PAIN DESCRIPTION:21022940}  In the last 24 hours, has pain interfered with the following? General activity {NUMBERS; 0-10:5044} Relation with others {NUMBERS; 0-10:5044} Enjoyment of life {NUMBERS; 0-10:5044} What TIME of day is your pain at its worst? {time of day:24191} Sleep (in general) {BHH GOOD/FAIR/POOR:22877}  Pain is worse with: {ACTIVITIES:21022942} Pain improves with: {PAIN IMPROVES WUJW:11914782} Relief from Meds: {NUMBERS; 0-10:5044}  Family History  Problem Relation Age of Onset   Heart disease Mother    Diabetes Mother    Asthma Mother    Lung disease Mother    Arthritis Mother    Allergies Mother    Heart attack Brother 37       deceased   Colon cancer Neg Hx    Social History   Socioeconomic History   Marital status: Married    Spouse name: Not on file   Number of children: Not on file   Years of education: 9th grade   Highest education level: Not on file  Occupational History   Occupation: disabled    Employer: NOT EMPLOYED  Tobacco Use   Smoking status: Former    Current packs/day: 0.00    Average packs/day: 2.0 packs/day for 34.0 years (68.0 ttl pk-yrs)    Types: Cigarettes    Start date: 12/09/1981    Quit date: 12/10/2015    Years since quitting: 7.4   Smokeless tobacco: Never   Tobacco comments:    up to 3 ppd  Vaping Use   Vaping status: Never Used  Substance and Sexual Activity   Alcohol use: No    Comment: rarely   Drug use: No   Sexual activity: Yes    Birth control/protection: Surgical  Other Topics Concern   Not on file  Social History Narrative   Not on file   Social Drivers of Health   Financial Resource Strain: Low Risk  (05/04/2023)   Received from [facility]   Overall Financial Resource Strain (CARDIA)     Difficulty of Paying Living Expenses: Not hard at all  Food Insecurity: No Food Insecurity (05/04/2023)   Received from [facility]   Hunger Vital Sign    Worried About Running Out of Food in the Last Year: Never true    Ran Out of Food in the Last Year: Never true  Transportation Needs: No Transportation Needs (05/04/2023)   Received from [facility] - Transportation    Lack of Transportation (Medical): No    Lack of Transportation (Non-Medical): No  Physical Activity: Unknown (10/17/2022)   Received from [facility]   Exercise Vital Sign    Days of Exercise per Week: 0 days    Minutes of Exercise per Session: Not on file  Stress: Stress Concern Present (10/17/2022)   Received from [facility] - Occupational Stress Questionnaire    Feeling of Stress : Rather much  Social Connections: Socially Integrated (10/17/2022)   Received from [facility]   Social Network    How would you rate your social network (family, work, friends)?: Good participation with social networks   Past Surgical History:  Procedure Laterality Date   ABDOMINAL HYSTERECTOMY     ANKLE ARTHROSCOPY WITH DRILLING/MICROFRACTURE  02/26/2012   Procedure: ANKLE ARTHROSCOPY WITH DRILLING/MICROFRACTURE;  Surgeon: Vickki Hearing, MD;  Location: [facility];  Service: Orthopedics;  Laterality: Right;   COLONOSCOPY  11/20/2011   RMR: Friable anorectum; single anal canal hemorrhoidal tag. Otherwise normal rectum and colon   ESOPHAGOGASTRODUODENOSCOPY  06/2010   diagnosed with Barrett's, small hh, esophagus dilated. Next EGD 06/2011   ESOPHAGOGASTRODUODENOSCOPY  Aug 2013   RMR: abnormal distal esophagus consistent with prior dx of SS Barrett;s. s/p dilation. small hiatal hernia. Biopsies: no metaplasia, dysplasia, malignancy. SURVEILLANCE AUG 2016   EXAM UNDER ANESTHESIA WITH MANIPULATION OF KNEE Right 09/16/2012   Procedure: EXAM UNDER ANESTHESIA WITH MANIPULATION OF RIGHT  KNEE;  Surgeon: Vickki Hearing, MD;  Location: [facility];  Service: Orthopedics;  Laterality: Right;   FOOT SURGERY     x 2   KNEE ARTHROSCOPY  02/26/2012   Procedure: ARTHROSCOPY KNEE;  Surgeon: Vickki Hearing, MD;  Location: [facility];  Service: Orthopedics;  Laterality: Right;  with three chondroplasties   KNEE ARTHROTOMY Left 11/21/2013   Procedure: KNEE ARTHROSCOPY  WITH MEDIAL, LATERAL, AND PATELLA  FEMORAL CHONDROPLASTY;  Surgeon: Vickki Hearing, MD;  Location: [facility];  Service: Orthopedics;  Laterality: Left;   s/p hysterectomy     SINUS SURGERY WITH INSTATRAK     SURGERY OF LIP     biopsy   TOTAL KNEE ARTHROPLASTY Right 07/01/2012   Procedure: TOTAL KNEE ARTHROPLASTY;  Surgeon: Vickki Hearing, MD;  Location: [facility];  Service: Orthopedics;  Laterality: Right;   TUBAL LIGATION     Past Surgical History:  Procedure Laterality Date   ABDOMINAL HYSTERECTOMY     ANKLE ARTHROSCOPY WITH DRILLING/MICROFRACTURE  02/26/2012   Procedure: ANKLE ARTHROSCOPY WITH DRILLING/MICROFRACTURE;  Surgeon: Vickki Hearing, MD;  Location: [facility];  Service: Orthopedics;  Laterality: Right;   COLONOSCOPY  11/20/2011   RMR: Friable anorectum; single anal canal hemorrhoidal tag. Otherwise normal rectum and colon   ESOPHAGOGASTRODUODENOSCOPY  06/2010   diagnosed with Barrett's, small hh, esophagus dilated. Next EGD 06/2011   ESOPHAGOGASTRODUODENOSCOPY  Aug 2013   RMR: abnormal distal esophagus consistent with prior dx of SS Barrett;s. s/p dilation. small hiatal hernia. Biopsies: no metaplasia, dysplasia, malignancy. SURVEILLANCE AUG 2016   EXAM UNDER ANESTHESIA WITH MANIPULATION OF KNEE Right 09/16/2012   Procedure: EXAM UNDER ANESTHESIA WITH MANIPULATION OF RIGHT KNEE;  Surgeon: Vickki Hearing, MD;  Location: [facility];  Service: Orthopedics;  Laterality: Right;   FOOT SURGERY     x 2   KNEE ARTHROSCOPY  02/26/2012   Procedure: ARTHROSCOPY KNEE;  Surgeon: Vickki Hearing, MD;  Location: [facility];   Service: Orthopedics;  Laterality: Right;  with three chondroplasties   KNEE ARTHROTOMY Left 11/21/2013   Procedure: KNEE ARTHROSCOPY  WITH MEDIAL, LATERAL, AND PATELLA  FEMORAL CHONDROPLASTY;  Surgeon: Vickki Hearing, MD;  Location: [facility];  Service: Orthopedics;  Laterality: Left;   s/p hysterectomy     SINUS SURGERY WITH INSTATRAK     SURGERY OF LIP     biopsy   TOTAL KNEE ARTHROPLASTY Right 07/01/2012   Procedure: TOTAL KNEE ARTHROPLASTY;  Surgeon: Vickki Hearing, MD;  Location: [facility];  Service: Orthopedics;  Laterality: Right;   TUBAL LIGATION     Past Medical History:  Diagnosis Date   Anxiety    Anxiety and depression    Does not like being around lots of people   Arthritis    Barrett's esophagus without dysplasia    Bipolar 1 disorder (HCC)    COPD (chronic obstructive  pulmonary disease) (HCC)    Depression    GERD (gastroesophageal reflux disease)    HTN (hypertension)    Hypercholesteremia    MS (multiple sclerosis) (HCC)    but not offcially disgnosed   Nerve damage    Severe to left foot   OA (osteoarthritis) of knee 01/31/2012   PONV (postoperative nausea and vomiting)    There were no vitals taken for this visit.  Opioid Risk Score:   Fall Risk Score:  `1  Depression screen PHQ 2/9     11/07/2022    3:14 PM 03/17/2022    1:45 PM 01/25/2021    1:21 PM 08/13/2020    2:30 PM 07/13/2020    2:56 PM 03/12/2020    2:50 PM 02/06/2020    2:30 PM  Depression screen PHQ 2/9  Decreased Interest 1 0 1 1 1  0 1  Down, Depressed, Hopeless 1 0 1 1 1  0 1  PHQ - 2 Score 2 0 2 2 2  0 2     Review of Systems     Objective:   Physical Exam        Assessment & Plan:

## 2023-06-04 NOTE — Progress Notes (Unsigned)
 Subjective:    Patient ID: Katelyn Thompson, female    DOB: 15-Mar-1966, 58 y.o.   MRN: [id number]  HPI  Pain Inventory Average Pain {NUMBERS; 0-10:5044} Pain Right Now {NUMBERS; 0-10:5044} My pain is {PAIN DESCRIPTION:21022940}  In the last 24 hours, has pain interfered with the following? General activity {NUMBERS; 0-10:5044} Relation with others {NUMBERS; 0-10:5044} Enjoyment of life {NUMBERS; 0-10:5044} What TIME of day is your pain at its worst? {time of day:24191} Sleep (in general) {BHH GOOD/FAIR/POOR:22877}  Pain is worse with: {ACTIVITIES:21022942} Pain improves with: {PAIN IMPROVES WUJW:11914782} Relief from Meds: {NUMBERS; 0-10:5044}  Family History  Problem Relation Age of Onset   Heart disease Mother    Diabetes Mother    Asthma Mother    Lung disease Mother    Arthritis Mother    Allergies Mother    Heart attack Brother 9       deceased   Colon cancer Neg Hx    Social History   Socioeconomic History   Marital status: Married    Spouse name: Not on file   Number of children: Not on file   Years of education: 9th grade   Highest education level: Not on file  Occupational History   Occupation: disabled    Employer: NOT EMPLOYED  Tobacco Use   Smoking status: Former    Current packs/day: 0.00    Average packs/day: 2.0 packs/day for 34.0 years (68.0 ttl pk-yrs)    Types: Cigarettes    Start date: 12/09/1981    Quit date: 12/10/2015    Years since quitting: 7.4   Smokeless tobacco: Never   Tobacco comments:    up to 3 ppd  Vaping Use   Vaping status: Never Used  Substance and Sexual Activity   Alcohol use: No    Comment: rarely   Drug use: No   Sexual activity: Yes    Birth control/protection: Surgical  Other Topics Concern   Not on file  Social History Narrative   Not on file   Social Drivers of Health   Financial Resource Strain: Low Risk  (05/04/2023)   Received from [facility]   Overall Financial Resource Strain (CARDIA)     Difficulty of Paying Living Expenses: Not hard at all  Food Insecurity: No Food Insecurity (05/04/2023)   Received from [facility]   Hunger Vital Sign    Worried About Running Out of Food in the Last Year: Never true    Ran Out of Food in the Last Year: Never true  Transportation Needs: No Transportation Needs (05/04/2023)   Received from [facility] - Transportation    Lack of Transportation (Medical): No    Lack of Transportation (Non-Medical): No  Physical Activity: Unknown (10/17/2022)   Received from [facility]   Exercise Vital Sign    Days of Exercise per Week: 0 days    Minutes of Exercise per Session: Not on file  Stress: Stress Concern Present (10/17/2022)   Received from [facility] - Occupational Stress Questionnaire    Feeling of Stress : Rather much  Social Connections: Socially Integrated (10/17/2022)   Received from [facility]   Social Network    How would you rate your social network (family, work, friends)?: Good participation with social networks   Past Surgical History:  Procedure Laterality Date   ABDOMINAL HYSTERECTOMY     ANKLE ARTHROSCOPY WITH DRILLING/MICROFRACTURE  02/26/2012   Procedure: ANKLE ARTHROSCOPY WITH DRILLING/MICROFRACTURE;  Surgeon: Vickki Hearing, MD;  Location: [facility];  Service: Orthopedics;  Laterality: Right;   COLONOSCOPY  11/20/2011   RMR: Friable anorectum; single anal canal hemorrhoidal tag. Otherwise normal rectum and colon   ESOPHAGOGASTRODUODENOSCOPY  06/2010   diagnosed with Barrett's, small hh, esophagus dilated. Next EGD 06/2011   ESOPHAGOGASTRODUODENOSCOPY  Aug 2013   RMR: abnormal distal esophagus consistent with prior dx of SS Barrett;s. s/p dilation. small hiatal hernia. Biopsies: no metaplasia, dysplasia, malignancy. SURVEILLANCE AUG 2016   EXAM UNDER ANESTHESIA WITH MANIPULATION OF KNEE Right 09/16/2012   Procedure: EXAM UNDER ANESTHESIA WITH MANIPULATION OF RIGHT  KNEE;  Surgeon: Vickki Hearing, MD;  Location: [facility];  Service: Orthopedics;  Laterality: Right;   FOOT SURGERY     x 2   KNEE ARTHROSCOPY  02/26/2012   Procedure: ARTHROSCOPY KNEE;  Surgeon: Vickki Hearing, MD;  Location: [facility];  Service: Orthopedics;  Laterality: Right;  with three chondroplasties   KNEE ARTHROTOMY Left 11/21/2013   Procedure: KNEE ARTHROSCOPY  WITH MEDIAL, LATERAL, AND PATELLA  FEMORAL CHONDROPLASTY;  Surgeon: Vickki Hearing, MD;  Location: [facility];  Service: Orthopedics;  Laterality: Left;   s/p hysterectomy     SINUS SURGERY WITH INSTATRAK     SURGERY OF LIP     biopsy   TOTAL KNEE ARTHROPLASTY Right 07/01/2012   Procedure: TOTAL KNEE ARTHROPLASTY;  Surgeon: Vickki Hearing, MD;  Location: [facility];  Service: Orthopedics;  Laterality: Right;   TUBAL LIGATION     Past Surgical History:  Procedure Laterality Date   ABDOMINAL HYSTERECTOMY     ANKLE ARTHROSCOPY WITH DRILLING/MICROFRACTURE  02/26/2012   Procedure: ANKLE ARTHROSCOPY WITH DRILLING/MICROFRACTURE;  Surgeon: Vickki Hearing, MD;  Location: [facility];  Service: Orthopedics;  Laterality: Right;   COLONOSCOPY  11/20/2011   RMR: Friable anorectum; single anal canal hemorrhoidal tag. Otherwise normal rectum and colon   ESOPHAGOGASTRODUODENOSCOPY  06/2010   diagnosed with Barrett's, small hh, esophagus dilated. Next EGD 06/2011   ESOPHAGOGASTRODUODENOSCOPY  Aug 2013   RMR: abnormal distal esophagus consistent with prior dx of SS Barrett;s. s/p dilation. small hiatal hernia. Biopsies: no metaplasia, dysplasia, malignancy. SURVEILLANCE AUG 2016   EXAM UNDER ANESTHESIA WITH MANIPULATION OF KNEE Right 09/16/2012   Procedure: EXAM UNDER ANESTHESIA WITH MANIPULATION OF RIGHT KNEE;  Surgeon: Vickki Hearing, MD;  Location: [facility];  Service: Orthopedics;  Laterality: Right;   FOOT SURGERY     x 2   KNEE ARTHROSCOPY  02/26/2012   Procedure: ARTHROSCOPY KNEE;  Surgeon: Vickki Hearing, MD;  Location: [facility];   Service: Orthopedics;  Laterality: Right;  with three chondroplasties   KNEE ARTHROTOMY Left 11/21/2013   Procedure: KNEE ARTHROSCOPY  WITH MEDIAL, LATERAL, AND PATELLA  FEMORAL CHONDROPLASTY;  Surgeon: Vickki Hearing, MD;  Location: [facility];  Service: Orthopedics;  Laterality: Left;   s/p hysterectomy     SINUS SURGERY WITH INSTATRAK     SURGERY OF LIP     biopsy   TOTAL KNEE ARTHROPLASTY Right 07/01/2012   Procedure: TOTAL KNEE ARTHROPLASTY;  Surgeon: Vickki Hearing, MD;  Location: [facility];  Service: Orthopedics;  Laterality: Right;   TUBAL LIGATION     Past Medical History:  Diagnosis Date   Anxiety    Anxiety and depression    Does not like being around lots of people   Arthritis    Barrett's esophagus without dysplasia    Bipolar 1 disorder (HCC)    COPD (chronic obstructive  pulmonary disease) (HCC)    Depression    GERD (gastroesophageal reflux disease)    HTN (hypertension)    Hypercholesteremia    MS (multiple sclerosis) (HCC)    but not offcially disgnosed   Nerve damage    Severe to left foot   OA (osteoarthritis) of knee 01/31/2012   PONV (postoperative nausea and vomiting)    There were no vitals taken for this visit.  Opioid Risk Score:   Fall Risk Score:  `1  Depression screen PHQ 2/9     11/07/2022    3:14 PM 03/17/2022    1:45 PM 01/25/2021    1:21 PM 08/13/2020    2:30 PM 07/13/2020    2:56 PM 03/12/2020    2:50 PM 02/06/2020    2:30 PM  Depression screen PHQ 2/9  Decreased Interest 1 0 1 1 1  0 1  Down, Depressed, Hopeless 1 0 1 1 1  0 1  PHQ - 2 Score 2 0 2 2 2  0 2     Review of Systems     Objective:   Physical Exam        Assessment & Plan:

## 2023-06-05 ENCOUNTER — Encounter: Payer: Self-pay | Admitting: Physical Medicine & Rehabilitation

## 2023-06-05 ENCOUNTER — Encounter: Payer: Medicare Other | Attending: Physical Medicine & Rehabilitation | Admitting: Physical Medicine & Rehabilitation

## 2023-06-05 VITALS — BP 126/81 | HR 75 | Ht 64.0 in | Wt 233.0 lb

## 2023-06-05 DIAGNOSIS — M47817 Spondylosis without myelopathy or radiculopathy, lumbosacral region: Secondary | ICD-10-CM | POA: Insufficient documentation

## 2023-06-05 DIAGNOSIS — M1712 Unilateral primary osteoarthritis, left knee: Secondary | ICD-10-CM | POA: Insufficient documentation

## 2023-06-05 NOTE — Progress Notes (Signed)
 Subjective:    Patient ID: Katelyn Thompson, female    DOB: [birth year], 58 y.o.   MRN: [id number]  HPI 58 year old female returns today with primary complaint of left knee pain.  She has a history of right knee arthroplasty and has end-stage left knee pain due to osteoarthritis but because of the contracture she has on the right side she is not felt to be a good candidate for left-sided total knee replacement.  She states that her knee pain is worse than her back pain.  She does have some swelling in the knee.  She has had no recent trauma.  MRI OF THE LEFT KNEE WITHOUT CONTRAST   TECHNIQUE:  Multiplanar, multisequence MR imaging of the knee was performed. No  intravenous contrast was administered.   COMPARISON:  None.   FINDINGS:  MENISCI   Medial meniscus:  Intact.   Lateral meniscus: Small vertical tear of the free edge of the body  of the lateral meniscus.   LIGAMENTS   Cruciates: Intact ACL and PCL.   Collaterals: Medial collateral ligament is intact. Lateral  collateral ligament complex is intact.   CARTILAGE   Patellofemoral: Focal area of full-thickness cartilage loss of the  medial patellar facet with a small area of adjacent delamination  measuring 3.8 mm.   Medial: Cartilage irregularity of the medial femoral condyle and  medial tibial plateau with small areas of full-thickness cartilage  loss involving the medial femoral condyle.   Lateral: Cartilage irregularity with high-grade partial thickness  cartilage loss of the lateral femoral condyle and medial tibial  plateau. There is an area of full-thickness cartilage loss of the  lateral femoral condyle.   Joint:  Moderate joint effusion.  No plical thickening.   Popliteal Fossa:  No Baker's cyst.  Intact popliteus tendon.   Extensor Mechanism:  Intact.   Bones: No focal marrow signal abnormality. Tricompartmental  osteophytosis.   IMPRESSION:  1. Tricompartmental cartilage abnormality as described  above.  2. Small vertical tear of the free edge of the body of the lateral  meniscus.    Electronically Signed    By: Elige Ko    On: 10/07/2013 14:08  Pain Inventory Average Pain 7 Pain Right Now 8 My pain is intermittent, constant, sharp, burning, dull, stabbing, tingling, and aching  In the last 24 hours, has pain interfered with the following? General activity 5 Relation with others 3 Enjoyment of life 3 What TIME of day is your pain at its worst? morning , daytime, evening, night, and varies Sleep (in general) Poor  Pain is worse with: walking, bending, sitting, standing, and some activites Pain improves with: rest, heat/ice, medication, and injections Relief from Meds: 3  Family History  Problem Relation Age of Onset   Heart disease Mother    Diabetes Mother    Asthma Mother    Lung disease Mother    Arthritis Mother    Allergies Mother    Heart attack Brother 20       deceased   Colon cancer Neg Hx    Social History   Socioeconomic History   Marital status: Married    Spouse name: Not on file   Number of children: Not on file   Years of education: 9th grade   Highest education level: Not on file  Occupational History   Occupation: disabled    Employer: NOT EMPLOYED  Tobacco Use   Smoking status: Former    Current packs/day: 0.00    Average  packs/day: 2.0 packs/day for 34.0 years (68.0 ttl pk-yrs)    Types: Cigarettes    Start date: 12/09/1981    Quit date: 12/10/2015    Years since quitting: 7.4   Smokeless tobacco: Never   Tobacco comments:    up to 3 ppd  Vaping Use   Vaping status: Never Used  Substance and Sexual Activity   Alcohol use: No    Comment: rarely   Drug use: No   Sexual activity: Yes    Birth control/protection: Surgical  Other Topics Concern   Not on file  Social History Narrative   Not on file   Social Drivers of Health   Financial Resource Strain: Low Risk  (05/04/2023)   Received from [facility]   Overall  Financial Resource Strain (CARDIA)    Difficulty of Paying Living Expenses: Not hard at all  Food Insecurity: No Food Insecurity (05/04/2023)   Received from [facility]   Hunger Vital Sign    Worried About Running Out of Food in the Last Year: Never true    Ran Out of Food in the Last Year: Never true  Transportation Needs: No Transportation Needs (05/04/2023)   Received from [facility] - Transportation    Lack of Transportation (Medical): No    Lack of Transportation (Non-Medical): No  Physical Activity: Unknown (10/17/2022)   Received from [facility]   Exercise Vital Sign    Days of Exercise per Week: 0 days    Minutes of Exercise per Session: Not on file  Stress: Stress Concern Present (10/17/2022)   Received from [facility] - Occupational Stress Questionnaire    Feeling of Stress : Rather much  Social Connections: Socially Integrated (10/17/2022)   Received from [facility]   Social Network    How would you rate your social network (family, work, friends)?: Good participation with social networks   Past Surgical History:  Procedure Laterality Date   ABDOMINAL HYSTERECTOMY     ANKLE ARTHROSCOPY WITH DRILLING/MICROFRACTURE  02/26/2012   Procedure: ANKLE ARTHROSCOPY WITH DRILLING/MICROFRACTURE;  Surgeon: Vickki Hearing, MD;  Location: [facility];  Service: Orthopedics;  Laterality: Right;   COLONOSCOPY  11/20/2011   RMR: Friable anorectum; single anal canal hemorrhoidal tag. Otherwise normal rectum and colon   ESOPHAGOGASTRODUODENOSCOPY  06/2010   diagnosed with Barrett's, small hh, esophagus dilated. Next EGD 06/2011   ESOPHAGOGASTRODUODENOSCOPY  Aug 2013   RMR: abnormal distal esophagus consistent with prior dx of SS Barrett;s. s/p dilation. small hiatal hernia. Biopsies: no metaplasia, dysplasia, malignancy. SURVEILLANCE AUG 2016   EXAM UNDER ANESTHESIA WITH MANIPULATION OF KNEE Right 09/16/2012   Procedure: EXAM UNDER  ANESTHESIA WITH MANIPULATION OF RIGHT KNEE;  Surgeon: Vickki Hearing, MD;  Location: [facility];  Service: Orthopedics;  Laterality: Right;   FOOT SURGERY     x 2   KNEE ARTHROSCOPY  02/26/2012   Procedure: ARTHROSCOPY KNEE;  Surgeon: Vickki Hearing, MD;  Location: [facility];  Service: Orthopedics;  Laterality: Right;  with three chondroplasties   KNEE ARTHROTOMY Left 11/21/2013   Procedure: KNEE ARTHROSCOPY  WITH MEDIAL, LATERAL, AND PATELLA  FEMORAL CHONDROPLASTY;  Surgeon: Vickki Hearing, MD;  Location: [facility];  Service: Orthopedics;  Laterality: Left;   s/p hysterectomy     SINUS SURGERY WITH INSTATRAK     SURGERY OF LIP     biopsy   TOTAL KNEE ARTHROPLASTY Right 07/01/2012   Procedure: TOTAL KNEE ARTHROPLASTY;  Surgeon: Vickki Hearing, MD;  Location: [facility];  Service: Orthopedics;  Laterality: Right;   TUBAL LIGATION     Past Surgical History:  Procedure Laterality Date   ABDOMINAL HYSTERECTOMY     ANKLE ARTHROSCOPY WITH DRILLING/MICROFRACTURE  02/26/2012   Procedure: ANKLE ARTHROSCOPY WITH DRILLING/MICROFRACTURE;  Surgeon: Vickki Hearing, MD;  Location: [facility];  Service: Orthopedics;  Laterality: Right;   COLONOSCOPY  11/20/2011   RMR: Friable anorectum; single anal canal hemorrhoidal tag. Otherwise normal rectum and colon   ESOPHAGOGASTRODUODENOSCOPY  06/2010   diagnosed with Barrett's, small hh, esophagus dilated. Next EGD 06/2011   ESOPHAGOGASTRODUODENOSCOPY  Aug 2013   RMR: abnormal distal esophagus consistent with prior dx of SS Barrett;s. s/p dilation. small hiatal hernia. Biopsies: no metaplasia, dysplasia, malignancy. SURVEILLANCE AUG 2016   EXAM UNDER ANESTHESIA WITH MANIPULATION OF KNEE Right 09/16/2012   Procedure: EXAM UNDER ANESTHESIA WITH MANIPULATION OF RIGHT KNEE;  Surgeon: Vickki Hearing, MD;  Location: [facility];  Service: Orthopedics;  Laterality: Right;   FOOT SURGERY     x 2   KNEE ARTHROSCOPY  02/26/2012   Procedure: ARTHROSCOPY KNEE;  Surgeon:  Vickki Hearing, MD;  Location: [facility];  Service: Orthopedics;  Laterality: Right;  with three chondroplasties   KNEE ARTHROTOMY Left 11/21/2013   Procedure: KNEE ARTHROSCOPY  WITH MEDIAL, LATERAL, AND PATELLA  FEMORAL CHONDROPLASTY;  Surgeon: Vickki Hearing, MD;  Location: [facility];  Service: Orthopedics;  Laterality: Left;   s/p hysterectomy     SINUS SURGERY WITH INSTATRAK     SURGERY OF LIP     biopsy   TOTAL KNEE ARTHROPLASTY Right 07/01/2012   Procedure: TOTAL KNEE ARTHROPLASTY;  Surgeon: Vickki Hearing, MD;  Location: [facility];  Service: Orthopedics;  Laterality: Right;   TUBAL LIGATION     Past Medical History:  Diagnosis Date   Anxiety    Anxiety and depression    Does not like being around lots of people   Arthritis    Barrett's esophagus without dysplasia    Bipolar 1 disorder (HCC)    COPD (chronic obstructive pulmonary disease) (HCC)    Depression    GERD (gastroesophageal reflux disease)    HTN (hypertension)    Hypercholesteremia    MS (multiple sclerosis) (HCC)    but not offcially disgnosed   Nerve damage    Severe to left foot   OA (osteoarthritis) of knee 01/31/2012   PONV (postoperative nausea and vomiting)    BP 126/81   Pulse 75   Ht 5\' 4"  (1.626 m)   Wt 233 lb (105.7 kg)   SpO2 95%   BMI 39.99 kg/m   Opioid Risk Score:   Fall Risk Score:  `1  Depression screen PHQ 2/9     06/05/2023    2:10 PM 11/07/2022    3:14 PM 03/17/2022    1:45 PM 01/25/2021    1:21 PM 08/13/2020    2:30 PM 07/13/2020    2:56 PM 03/12/2020    2:50 PM  Depression screen PHQ 2/9  Decreased Interest 0 1 0 1 1 1  0  Down, Depressed, Hopeless  1 0 1 1 1  0  PHQ - 2 Score 0 2 0 2 2 2  0    Review of Systems  Musculoskeletal:  Positive for back pain and gait problem.       Pain in the left knee, hands, left ankle   All other systems reviewed and are negative.  Objective:   Physical Exam  Left knee with mild effusion has full extension and flexion.  Tenderness  along the medial aspect of the joint line. Mild crepitus No pain with hip range of motion Lumbar spine has some tenderness palpation in the lumbosacral area pain with extension greater than with flexion. Negative straight leg raise test Ambulates with mildly antalgic gait favoring the right lower extremity      Assessment & Plan:  1.  Left knee pain due to osteoarthritis.  She has been told she is a poor surgical candidate given her outcome with a right total knee.  She does not get up prolonged relief with the corticosteroid injections maybe about a month per her report.  We discussed genicular nerve blocks and if these are helpful in reducing pain by at least 50% on a short-term basis then radiofrequency neurotomy of the same nerves. 2.  Lumbar spondylosis without myelopathy or RF starting to wear off however she still has at least 50% pain relief at the 90-month mark.  Her knee pain is the bigger concern.  Will address that first but she will likely need repeat bilateral L3-L4-L5 RF in the next 1 to 2 months.

## 2023-06-28 NOTE — Progress Notes (Addendum)
  PROCEDURE RECORD [facility]   Name: ARIBELLA VAVRA DOB:Sep 01, 1965 MRN: [id number]  Date:06/28/2023  Physician: Claudette Laws, MD    Nurse/CMA: Charise Carwin MA  Allergies:  Allergies  Allergen Reactions   Aspirin Other (See Comments)    esophagus disease heartburn   Hydrochlorothiazide     Other reaction(s): Other Hyponatremia   Nabumetone Nausea And Vomiting and Other (See Comments)   Nitrofurantoin Nausea And Vomiting   Red Dye #40 (Allura Red) Rash    Kdc:red Dye+yellow Dye+nitrofurantoin+brilliant Blue Fcf    Consent Signed: Yes.    Is patient diabetic? No.  CBG today? NA  Pregnant: No. LMP: No LMP recorded. Patient has had a hysterectomy. (age 89-55)  Anticoagulants:  Plavix Anti-inflammatory: yes (Tramadol) Antibiotics: no  Procedure: Left Genicular Nerve Block  Position: Supine Start Time: 2:59 PM  End Time: 3:08 PM  Fluoro Time: 1:07  RN/CMA Linda Biehn MA Darrly Loberg    Time 2:30 PM 3:19 pm    BP 95/62 113/77    Pulse 74 70    Respirations 16 16    O2 Sat 98 95    S/S 6 6    Pain Level 6/10 0/10     D/C home with Spouse, patient A & O X 3, D/C instructions reviewed, and sits independently.

## 2023-07-01 ENCOUNTER — Other Ambulatory Visit: Payer: Self-pay | Admitting: Physical Medicine & Rehabilitation

## 2023-07-06 ENCOUNTER — Encounter: Payer: Self-pay | Admitting: Physical Medicine & Rehabilitation

## 2023-07-06 ENCOUNTER — Encounter: Payer: Medicare Other | Attending: Physical Medicine & Rehabilitation | Admitting: Physical Medicine & Rehabilitation

## 2023-07-06 VITALS — BP 95/62 | HR 82 | Ht 64.0 in | Wt 236.0 lb

## 2023-07-06 DIAGNOSIS — M1712 Unilateral primary osteoarthritis, left knee: Secondary | ICD-10-CM

## 2023-07-06 MED ORDER — BETAMETHASONE SOD PHOS & ACET 6 (3-3) MG/ML IJ SUSP
6.0000 mg | Freq: Once | INTRAMUSCULAR | Status: AC
  Administered 2023-07-06: 6 mg via INTRAMUSCULAR

## 2023-07-06 MED ORDER — BUPIVACAINE HCL (PF) 0.25 % IJ SOLN
10.0000 mL | Freq: Once | INTRAMUSCULAR | Status: AC
Start: 2023-07-06 — End: 2023-07-06
  Administered 2023-07-06: 10 mL

## 2023-07-06 MED ORDER — IOHEXOL 180 MG/ML  SOLN
3.0000 mL | Freq: Once | INTRAMUSCULAR | Status: AC
  Administered 2023-07-06: 3 mL

## 2023-07-06 NOTE — Progress Notes (Signed)
Genicular nerve block x 3, Upper medial, Upper lateral , and Lower Medial under fluoroscopic guidance  Indication Chronic post operative pain in the Knee, pain postop total knee replacement which has not responded to conservative management such as physical therapy and medication management  Informed consent was obtained after describing risks and to the procedure to the patient these include bleeding bruising and infection, patient elects to proceed and has given written consent. Patient placed supine on the fluoroscopy table [facility] images of the knee joint were obtained. A 25-gauge 1.5 inch needle was used to anesthetize the skin and subcutaneous tissue with 1% lidocaine, 1.5 cc at each of 3 locations. Then a 22-gauge 3.5" spinal needle was inserted targeting the junction of the medial flare of the tibia with the shaft of the tibia, bone contact made and confirmed with lateral imaging. Then Omnipaque 180 x0.5 mL demonstrated no intravascular uptake followed by injection of 1.57m .5% bupivacaine. Then the junction of the medial epicondyles of the femur with the femoral shaft was targeted needle was advanced under fluoroscopic guidance until bone contact. Appropriate depth was obtained and confirmed with lateral images. Then Omnipaque 180 x0.5 mL demonstrated no intravascular uptake followed by injection of 1.521mof .5% bupivacaine. Then the junction of the lateral femoral condyle with the femoral shaft was targeted. 22-gauge 3.5 inch needle was advanced under fluoroscopic guidance until bone contact. Appropriate depth was confirmed with lateral imaging. 0.5 mL of Omnipaque 180 injected followed by injection of 1.5 cc of .5% bupivacaine solution. Patient tolerated procedure well. Post procedure instructions given  Lidocaine 1% with preservative 4.83m52mmnipaque 180 1.83ml32mpivacaine 0.5% 4.83ml62m

## 2023-07-30 ENCOUNTER — Other Ambulatory Visit: Payer: Self-pay | Admitting: Physical Medicine & Rehabilitation

## 2023-08-07 NOTE — Progress Notes (Signed)
  PROCEDURE RECORD [facility]   Name: Katelyn Thompson DOB:1965/12/22 MRN: [id number]  Date:08/09/2023  Physician: Janeece Mechanic, MD    Nurse/CMA: Cathy Cobbs RN  Allergies:  Allergies  Allergen Reactions   Aspirin  Other (See Comments)    esophagus disease heartburn   Hydrochlorothiazide     Other reaction(s): Other Hyponatremia   Nabumetone Nausea And Vomiting and Other (See Comments)   Nitrofurantoin Nausea And Vomiting   Red Dye #40 (Allura Red) Rash    Kdc:red Dye+yellow Dye+nitrofurantoin+brilliant Blue Fcf    Consent Signed: Yes.    Is patient diabetic? No.  CBG today?   Pregnant: No. LMP: No LMP recorded. Patient has had a hysterectomy. (age [age])  Anticoagulants: yes (plavix) Anti-inflammatory: no Antibiotics: no  Procedure: BIL L3-4-5 RADIOFREQUENCY NEUROTOMY Position: Prone Start Time: 13:50  End Time: 14:17  Fluoro Time: 1:12 min  RN/CMA Mauri Temkin RN Kerina Simoneau RN    Time 1:21 2:30    BP 119/77 130/83    Pulse 79 80    Respirations 16 16    O2 Sat 93 94    S/S 6 6    Pain Level 7 0     D/C home with Katelyn Thompson, patient A & O X 3, D/C instructions reviewed, and sits independently.

## 2023-08-09 ENCOUNTER — Encounter: Payer: Self-pay | Admitting: Physical Medicine & Rehabilitation

## 2023-08-09 ENCOUNTER — Encounter: Attending: Physical Medicine & Rehabilitation | Admitting: Physical Medicine & Rehabilitation

## 2023-08-09 VITALS — BP 119/77 | HR 79 | Ht 64.0 in | Wt 236.0 lb

## 2023-08-09 DIAGNOSIS — M47817 Spondylosis without myelopathy or radiculopathy, lumbosacral region: Secondary | ICD-10-CM | POA: Diagnosis present

## 2023-08-09 MED ORDER — LIDOCAINE HCL (PF) 2 % IJ SOLN
6.0000 mL | Freq: Once | INTRAMUSCULAR | Status: AC
  Administered 2023-08-09: 6 mL

## 2023-08-09 MED ORDER — LIDOCAINE HCL 1 % IJ SOLN
10.0000 mL | Freq: Once | INTRAMUSCULAR | Status: AC
  Administered 2023-08-09: 10 mL

## 2023-08-09 NOTE — Progress Notes (Signed)
Bilateral L5 dorsal ramus.,  L4 and  L3 medial branch radio frequency neurotomy under fluoroscopic guidance  Indication: Low back pain due to lumbar spondylosis which has been relieved on 2 occasions by greater than 50% by lumbar medial branch blocks at corresponding levels.  Informed consent was obtained after describing risks and benefits of the procedure with the patient, this includes bleeding, bruising, infection, paralysis and medication side effects. The patient wishes to proceed and has given written consent. The patient was placed in a prone position. The lumbar and sacral area was marked and prepped with Betadine. A 25-gauge 1-1/2 inch needle was inserted into the skin and subcutaneous tissue at 3 sites in one ML of 2% lidocaine was injected into each site. Then a 18-gauge 15 cm radio frequency needle with a 1 cm curved active tip was inserted targeting the left S1 SAP/sacral ala junction. Bone contact was made and confirmed with lateral imaging.  motor stimulation at 2 Hz confirm proper needle location followed by injection of one ML of 2% MPF lidocaine. Then the left L5 SAP/transverse process junction was targeted. Bone contact was made and confirmed with lateral imaging motor stimulation at 2 Hz confirm proper needle location followed by injection of one ML of the solution containing one ML of  2% MPF lidocaine. Then the left L4 SAP/transverse process junction was targeted. Bone contact was made and confirmed with lateral imaging. motor stimulation at 2 Hz confirm proper needle location followed by injection of one ML of the solution containing one ML of2% MPF lidocaine. Radio frequency lesion  at [facility] for 90 seconds was performed. Needles were removed.this same procedure was performed on the right side with the same needles , injectate and technique.  Post procedure instructions and vital signs were performed. Patient tolerated procedure well. Followup appointment was given.

## 2023-08-26 ENCOUNTER — Other Ambulatory Visit: Payer: Self-pay | Admitting: Physical Medicine & Rehabilitation

## 2023-08-27 ENCOUNTER — Other Ambulatory Visit: Payer: Self-pay | Admitting: Physical Medicine & Rehabilitation

## 2023-08-28 ENCOUNTER — Other Ambulatory Visit: Payer: Self-pay

## 2023-08-28 ENCOUNTER — Telehealth: Payer: Self-pay | Admitting: Physical Medicine & Rehabilitation

## 2023-08-28 MED ORDER — TIZANIDINE HCL 4 MG PO TABS: 4.0000 mg | ORAL_TABLET | Freq: Two times a day (BID) | ORAL | 0 refills | Status: DC

## 2023-08-28 MED ORDER — TRAMADOL HCL 50 MG PO TABS: 50.0000 mg | ORAL_TABLET | Freq: Three times a day (TID) | ORAL | 1 refills | Status: DC | PRN

## 2023-08-28 NOTE — Telephone Encounter (Signed)
 P needs refill of zanaflex  and tramadol   [facility] [place]

## 2023-08-28 NOTE — Telephone Encounter (Signed)
 PMP was Reviewed.  Tramadol  e-scribed to pharmacy. Call placed to Katelyn Thompson she reports she uses the Tizanidine  as needed . Prescription sent for Tizanidine  as well. Katelyn Thompson understanding.

## 2023-10-01 ENCOUNTER — Other Ambulatory Visit: Payer: Self-pay | Admitting: Registered Nurse

## 2023-10-27 ENCOUNTER — Other Ambulatory Visit: Payer: Self-pay | Admitting: Registered Nurse

## 2024-02-12 ENCOUNTER — Encounter: Payer: Self-pay | Admitting: Physical Medicine & Rehabilitation

## 2024-02-12 ENCOUNTER — Encounter: Attending: Physical Medicine & Rehabilitation | Admitting: Physical Medicine & Rehabilitation

## 2024-02-12 VITALS — BP 125/83 | HR 80 | Ht 64.0 in

## 2024-02-12 DIAGNOSIS — M47817 Spondylosis without myelopathy or radiculopathy, lumbosacral region: Secondary | ICD-10-CM | POA: Insufficient documentation

## 2024-02-12 NOTE — Progress Notes (Signed)
 Subjective:    Patient ID: Katelyn Thompson, female    DOB: 1966/03/08, 58 y.o.   MRN: [id number] 08/09/2023:Bilateral L5 dorsal ramus.,  L4 and  L3 medial branch radio frequency neurotomy under fluoroscopic guidance HPI Patient is here today to follow-up on her lumbar spondylosis causing chronic low back pain. Discussed the use of AI scribe software for clinical note transcription with the patient, who gave verbal consent to proceed.  History of Present Illness Katelyn Thompson is a 58 year old female with chronic back pain who presents with worsening pain after radiofrequency ablation.  She underwent radiofrequency ablation on Sep 02, 2023, which initially provided approximately 50% pain relief. Over the last couple of months, the pain has been worsening.  The pain is located in the lower lumbar region on both sides and has been persistent for more than six months. It radiates down her legs, causing significant discomfort when walking. She describes that extensive walking exacerbates the pain.  She also experiences pain in her knees, which she plans to discuss with her knee doctor in [place]. The knee pain is described as severe and radiates down into her feet.  She takes Xanax  three times daily, once in the morning, once in the evening, and once at night. She is concerned about potential dependency on Xanax , as she has been on it for a long time.  During the review of symptoms, bending backwards is particularly painful, and she has noticed increased cold sensitivity in her feet and hands recently. No new symptoms or areas of pain beyond those already mentioned.   Pain Inventory Average Pain 10 Pain Right Now 8 My pain is burning, stabbing, and aching  In the last 24 hours, has pain interfered with the following? General activity 5 Relation with others 3 Enjoyment of life 5 What TIME of day is your pain at its worst? morning  and night Sleep (in general) Poor  Pain is worse with:  walking, bending, sitting, and standing Pain improves with: medication and injections Relief from Meds: 5  Family History  Problem Relation Age of Onset   Heart disease Mother    Diabetes Mother    Asthma Mother    Lung disease Mother    Arthritis Mother    Allergies Mother    Heart attack Brother 77       deceased   Colon cancer Neg Hx    Social History   Socioeconomic History   Marital status: Married    Spouse name: Not on file   Number of children: Not on file   Years of education: 9th grade   Highest education level: Not on file  Occupational History   Occupation: disabled    Employer: NOT EMPLOYED  Tobacco Use   Smoking status: Former    Current packs/day: 0.00    Average packs/day: 2.0 packs/day for 34.0 years (68.0 ttl pk-yrs)    Types: Cigarettes    Start date: 12/09/1981    Quit date: 12/10/2015    Years since quitting: 8.1   Smokeless tobacco: Never   Tobacco comments:    up to 3 ppd  Vaping Use   Vaping status: Never Used  Substance and Sexual Activity   Alcohol use: No    Comment: rarely   Drug use: No   Sexual activity: Yes    Birth control/protection: Surgical  Other Topics Concern   Not on file  Social History Narrative   Not on file   Social Drivers of  Health   Financial Resource Strain: Low Risk  (11/05/2023)   [facility]   Overall Financial Resource Strain (CARDIA)    How hard is it for you to pay for the very basics like food, housing, medical care, and heating?: Not very hard  Food Insecurity: No Food Insecurity (11/05/2023)   [facility]   Hunger Vital Sign    Within the past 12 months, you worried that your food would run out before you got the money to buy more.: Never true    Within the past 12 months, the food you bought just didn't last and you didn't have money to get more.: Never true  Transportation Needs: No Transportation Needs (11/05/2023)   Received from [facility] - Transportation     In the past 12 months, has lack of transportation kept you from medical appointments or from getting medications?: No    In the past 12 months, has lack of transportation kept you from meetings, work, or from getting things needed for daily living?: No  Physical Activity: Inactive (11/05/2023)   Received from [facility]   Exercise Vital Sign    On average, how many days per week do you engage in moderate to strenuous exercise (like a brisk walk)?: 0 days    Minutes of Exercise per Session: Not on file  Stress: Stress Concern Present (11/05/2023)   Received from [facility] - Occupational Stress Questionnaire    Do you feel stress - tense, restless, nervous, or anxious, or unable to sleep at night because your mind is troubled all the time - these days?: Rather much  Social Connections: Socially Integrated (11/05/2023)   Received from [facility]   Social Network    How would you rate your social network (family, work, friends)?: Good participation with social networks   Past Surgical History:  Procedure Laterality Date   ABDOMINAL HYSTERECTOMY     ANKLE ARTHROSCOPY WITH DRILLING/MICROFRACTURE  02/26/2012   Procedure: ANKLE ARTHROSCOPY WITH DRILLING/MICROFRACTURE;  Surgeon: Taft FORBES Minerva, MD;  Location: [facility];  Service: Orthopedics;  Laterality: Right;   COLONOSCOPY  11/20/2011   RMR: Friable anorectum; single anal canal hemorrhoidal tag. Otherwise normal rectum and colon   ESOPHAGOGASTRODUODENOSCOPY  06/2010   diagnosed with Barrett's, small hh, esophagus dilated. Next EGD 06/2011   ESOPHAGOGASTRODUODENOSCOPY  Aug 2013   RMR: abnormal distal esophagus consistent with prior dx of SS Barrett;s. s/p dilation. small hiatal hernia. Biopsies: no metaplasia, dysplasia, malignancy. SURVEILLANCE AUG 2016   EXAM UNDER ANESTHESIA WITH MANIPULATION OF KNEE Right 09/16/2012   Procedure: EXAM UNDER ANESTHESIA WITH MANIPULATION OF RIGHT KNEE;  Surgeon:  Taft FORBES Minerva, MD;  Location: [facility];  Service: Orthopedics;  Laterality: Right;   FOOT SURGERY     x 2   KNEE ARTHROSCOPY  02/26/2012   Procedure: ARTHROSCOPY KNEE;  Surgeon: Taft FORBES Minerva, MD;  Location: [facility];  Service: Orthopedics;  Laterality: Right;  with three chondroplasties   KNEE ARTHROTOMY Left 11/21/2013   Procedure: KNEE ARTHROSCOPY  WITH MEDIAL, LATERAL, AND PATELLA  FEMORAL CHONDROPLASTY;  Surgeon: Taft FORBES Minerva, MD;  Location: [facility];  Service: Orthopedics;  Laterality: Left;   s/p hysterectomy     SINUS SURGERY WITH INSTATRAK     SURGERY OF LIP     biopsy   TOTAL KNEE ARTHROPLASTY Right 07/01/2012   Procedure: TOTAL KNEE ARTHROPLASTY;  Surgeon: Taft FORBES Minerva, MD;  Location:  [facility];  Service: Orthopedics;  Laterality: Right;   TUBAL LIGATION     Past Surgical History:  Procedure Laterality Date   ABDOMINAL HYSTERECTOMY     ANKLE ARTHROSCOPY WITH DRILLING/MICROFRACTURE  02/26/2012   Procedure: ANKLE ARTHROSCOPY WITH DRILLING/MICROFRACTURE;  Surgeon: Taft FORBES Minerva, MD;  Location: [facility];  Service: Orthopedics;  Laterality: Right;   COLONOSCOPY  11/20/2011   RMR: Friable anorectum; single anal canal hemorrhoidal tag. Otherwise normal rectum and colon   ESOPHAGOGASTRODUODENOSCOPY  06/2010   diagnosed with Barrett's, small hh, esophagus dilated. Next EGD 06/2011   ESOPHAGOGASTRODUODENOSCOPY  Aug 2013   RMR: abnormal distal esophagus consistent with prior dx of SS Barrett;s. s/p dilation. small hiatal hernia. Biopsies: no metaplasia, dysplasia, malignancy. SURVEILLANCE AUG 2016   EXAM UNDER ANESTHESIA WITH MANIPULATION OF KNEE Right 09/16/2012   Procedure: EXAM UNDER ANESTHESIA WITH MANIPULATION OF RIGHT KNEE;  Surgeon: Taft FORBES Minerva, MD;  Location: [facility];  Service: Orthopedics;  Laterality: Right;   FOOT SURGERY     x 2   KNEE ARTHROSCOPY  02/26/2012   Procedure: ARTHROSCOPY KNEE;  Surgeon: Taft FORBES Minerva, MD;  Location: [facility];  Service:  Orthopedics;  Laterality: Right;  with three chondroplasties   KNEE ARTHROTOMY Left 11/21/2013   Procedure: KNEE ARTHROSCOPY  WITH MEDIAL, LATERAL, AND PATELLA  FEMORAL CHONDROPLASTY;  Surgeon: Taft FORBES Minerva, MD;  Location: [facility];  Service: Orthopedics;  Laterality: Left;   s/p hysterectomy     SINUS SURGERY WITH INSTATRAK     SURGERY OF LIP     biopsy   TOTAL KNEE ARTHROPLASTY Right 07/01/2012   Procedure: TOTAL KNEE ARTHROPLASTY;  Surgeon: Taft FORBES Minerva, MD;  Location: [facility];  Service: Orthopedics;  Laterality: Right;   TUBAL LIGATION     Past Medical History:  Diagnosis Date   Anxiety    Anxiety and depression    Does not like being around lots of people   Arthritis    Barrett's esophagus without dysplasia    Bipolar 1 disorder (HCC)    COPD (chronic obstructive pulmonary disease) (HCC)    Depression    GERD (gastroesophageal reflux disease)    HTN (hypertension)    Hypercholesteremia    MS (multiple sclerosis)    but not offcially disgnosed   Nerve damage    Severe to left foot   OA (osteoarthritis) of knee 01/31/2012   PONV (postoperative nausea and vomiting)    BP 125/83   Pulse 80   Ht 5' 4 (1.626 m)   SpO2 92%   BMI 40.51 kg/m   Opioid Risk Score:   Fall Risk Score:  `1  Depression screen PHQ 2/9     08/09/2023    1:22 PM 07/06/2023    2:27 PM 06/05/2023    2:10 PM 11/07/2022    3:14 PM 03/17/2022    1:45 PM 01/25/2021    1:21 PM 08/13/2020    2:30 PM  Depression screen PHQ 2/9  Decreased Interest 0 0 0 1 0 1 1  Down, Depressed, Hopeless 0 0  1 0 1 1  PHQ - 2 Score 0 0 0 2 0 2 2      Review of Systems  Musculoskeletal:  Positive for back pain.       B/L knee, hand and ankle pain  All other systems reviewed and are negative.      Objective:   Physical Exam  Patient no acute distress Mood affect appropriate Alert and oriented x 3  Ambulates without assist device no evidence of rectal instability Speech without dysarthria or  aphasia Motor strength is 5/5 bilateral lower extremities Negative straight leg raising bilaterally There is mild tenderness palpation bilateral elbow 3 4 and moderate L4-5 L5-S1 paraspinal area Lumbar range of motion 75% flexion 50% extension extension is pain forward flexion is not      Assessment & Plan:   Assessment and Plan Assessment & Plan Lumbar spondylosis with chronic low back pain and lumbar radiculopathy Recommend repeat L3-4 medial branch and L5 dorsal ramus radiofrequency neurotomy bilateral in the next month Chronic low back pain and radiculopathy due to lumbar spondylosis. Previous radiofrequency ablation provided 50% relief, now diminishing. Pain radiates down legs, worsens with walking and extension. Considering repeat ablation for relief. - Schedule radiofrequency ablation. - Instruct to take Xanax  30-60 minutes before procedure. - Advise to arrange transportation due to Xanax .

## 2024-02-12 NOTE — Patient Instructions (Signed)
 Take xanax  30-60 min prior to back injection

## 2024-02-24 ENCOUNTER — Other Ambulatory Visit: Payer: Self-pay | Admitting: Registered Nurse

## 2024-02-24 DIAGNOSIS — M1712 Unilateral primary osteoarthritis, left knee: Secondary | ICD-10-CM

## 2024-02-24 DIAGNOSIS — G894 Chronic pain syndrome: Secondary | ICD-10-CM

## 2024-02-24 DIAGNOSIS — M47817 Spondylosis without myelopathy or radiculopathy, lumbosacral region: Secondary | ICD-10-CM

## 2024-02-25 NOTE — Telephone Encounter (Signed)
 PDMP was Reviewed.  Call placed to Ms. Maurer , she is taking her Tramadol  three times a day as needed fo pain  Tramadol  e-scribed to pharmacy, she verbalizes understanding.

## 2024-03-27 ENCOUNTER — Encounter: Admitting: Physical Medicine & Rehabilitation

## 2024-03-27 ENCOUNTER — Encounter: Payer: Self-pay | Admitting: Physical Medicine & Rehabilitation

## 2024-03-27 VITALS — BP 122/80 | HR 81 | Temp 97.7°F | Ht 64.0 in | Wt 234.2 lb

## 2024-03-27 DIAGNOSIS — M47817 Spondylosis without myelopathy or radiculopathy, lumbosacral region: Secondary | ICD-10-CM | POA: Insufficient documentation

## 2024-03-27 MED ORDER — LIDOCAINE HCL 1 % IJ SOLN
10.0000 mL | Freq: Once | INTRAMUSCULAR | Status: AC
  Administered 2024-03-27: 14:00:00 10 mL

## 2024-03-27 MED ORDER — LIDOCAINE HCL (PF) 2 % IJ SOLN
6.0000 mL | Freq: Once | INTRAMUSCULAR | Status: AC
  Administered 2024-03-27: 14:00:00 6 mL

## 2024-03-27 NOTE — Progress Notes (Signed)
°  PROCEDURE RECORD [facility]   Name: Katelyn Thompson DOB:06/17/1965 MRN: [id number]  Date:03/27/2024  Physician: Prentice Compton, MD    Nurse/CMA: Brylynn Hanssen RN  Allergies: Allergies[1]  Consent Signed: Yes.    Is patient diabetic? No.  CBG today?   Pregnant: No. LMP: No LMP recorded. Patient has had a hysterectomy. (age [age])  Anticoagulants: yes (plavix) Anti-inflammatory: no Antibiotics: no  Procedure: bilateral lumbar 3-4-5 radiofrequency neurotomy  Position: Prone Start Time: 2:13  End Time: 2:40  Fluoro Time: 1:31  RN/CMA [employer]    Time 1:49 2:47    BP 122/80 149/92    Pulse 81 76    Respirations 14 14    O2 Sat 94 93    S/S 6 6    Pain Level 8/10 0/10     D/C home with husband, patient A & O X 3, D/C instructions reviewed, and sits independently.           [1]  Allergies Allergen Reactions   Aspirin  Other (See Comments)    esophagus disease heartburn   Hydrochlorothiazide     Other reaction(s): Other Hyponatremia   Nabumetone Nausea And Vomiting and Other (See Comments)   Nitrofurantoin Nausea And Vomiting   Red Dye #40 (Allura Red) Rash    Kdc:red Dye+yellow Dye+nitrofurantoin+brilliant Blue Fcf

## 2024-03-27 NOTE — Progress Notes (Signed)
Bilateral L5 dorsal ramus.,  L4 and  L3 medial branch radio frequency neurotomy under fluoroscopic guidance  Indication: Low back pain due to lumbar spondylosis which has been relieved on 2 occasions by greater than 50% by lumbar medial branch blocks at corresponding levels.  Informed consent was obtained after describing risks and benefits of the procedure with the patient, this includes bleeding, bruising, infection, paralysis and medication side effects. The patient wishes to proceed and has given written consent. The patient was placed in a prone position. The lumbar and sacral area was marked and prepped with Betadine. A 25-gauge 1-1/2 inch needle was inserted into the skin and subcutaneous tissue at 3 sites in one ML of 2% lidocaine was injected into each site. Then a 18-gauge 15 cm radio frequency needle with a 1 cm curved active tip was inserted targeting the left S1 SAP/sacral ala junction. Bone contact was made and confirmed with lateral imaging.  motor stimulation at 2 Hz confirm proper needle location followed by injection of one ML of 2% MPF lidocaine. Then the left L5 SAP/transverse process junction was targeted. Bone contact was made and confirmed with lateral imaging motor stimulation at 2 Hz confirm proper needle location followed by injection of one ML of the solution containing one ML of  2% MPF lidocaine. Then the left L4 SAP/transverse process junction was targeted. Bone contact was made and confirmed with lateral imaging. motor stimulation at 2 Hz confirm proper needle location followed by injection of one ML of the solution containing one ML of2% MPF lidocaine. Radio frequency lesion  at [facility] for 90 seconds was performed. Needles were removed.this same procedure was performed on the right side with the same needles , injectate and technique.  Post procedure instructions and vital signs were performed. Patient tolerated procedure well. Followup appointment was given.

## 2024-05-04 ENCOUNTER — Other Ambulatory Visit: Payer: Self-pay | Admitting: Physical Medicine & Rehabilitation

## 2024-08-26 ENCOUNTER — Encounter: Admitting: Physical Medicine & Rehabilitation
